# Patient Record
Sex: Female | Born: 1950 | Race: White | Hispanic: No | Smoking: Never smoker
Health system: Southern US, Community
[De-identification: ages and names within clinical notes are randomized; demographics above are authoritative.]

## PROBLEM LIST (undated history)

## (undated) DIAGNOSIS — K146 Glossodynia: Secondary | ICD-10-CM

## (undated) DIAGNOSIS — R109 Unspecified abdominal pain: Secondary | ICD-10-CM

## (undated) DIAGNOSIS — F329 Major depressive disorder, single episode, unspecified: Secondary | ICD-10-CM

## (undated) DIAGNOSIS — I10 Essential (primary) hypertension: Secondary | ICD-10-CM

## (undated) DIAGNOSIS — K921 Melena: Secondary | ICD-10-CM

## (undated) DIAGNOSIS — B370 Candidal stomatitis: Secondary | ICD-10-CM

## (undated) DIAGNOSIS — M797 Fibromyalgia: Secondary | ICD-10-CM

## (undated) DIAGNOSIS — K219 Gastro-esophageal reflux disease without esophagitis: Secondary | ICD-10-CM

## (undated) DIAGNOSIS — M810 Age-related osteoporosis without current pathological fracture: Secondary | ICD-10-CM

## (undated) DIAGNOSIS — F419 Anxiety disorder, unspecified: Secondary | ICD-10-CM

## (undated) HISTORY — DX: Anxiety disorder, unspecified: F41.9

## (undated) HISTORY — DX: Gastro-esophageal reflux disease without esophagitis: K21.9

## (undated) HISTORY — DX: Candidal stomatitis: B37.0

## (undated) HISTORY — DX: Unspecified abdominal pain: R10.9

## (undated) HISTORY — DX: Major depressive disorder, single episode, unspecified: F32.9

## (undated) HISTORY — DX: Age-related osteoporosis without current pathological fracture: M81.0

## (undated) HISTORY — DX: Melena: K92.1

## (undated) HISTORY — PX: ABDOMINAL HYSTERECTOMY: SHX81

## (undated) HISTORY — PX: BREAST LUMPECTOMY: SHX2

## (undated) HISTORY — DX: Glossodynia: K14.6

## (undated) HISTORY — PX: FOOT SURGERY: SHX648

## (undated) HISTORY — DX: Fibromyalgia: M79.7

## (undated) HISTORY — PX: TONSILLECTOMY: SUR1361

## (undated) HISTORY — DX: Essential (primary) hypertension: I10

---

## 1998-02-16 ENCOUNTER — Encounter: Admission: RE | Admit: 1998-02-16 | Discharge: 1998-02-16 | Payer: Self-pay | Admitting: Neurology

## 1998-03-08 ENCOUNTER — Encounter: Admission: RE | Admit: 1998-03-08 | Discharge: 1998-03-23 | Payer: Self-pay

## 1998-10-19 ENCOUNTER — Other Ambulatory Visit: Admission: RE | Admit: 1998-10-19 | Discharge: 1998-10-19 | Payer: Self-pay | Admitting: Obstetrics and Gynecology

## 1999-09-21 ENCOUNTER — Encounter: Payer: Self-pay | Admitting: Obstetrics and Gynecology

## 1999-09-21 ENCOUNTER — Encounter: Admission: RE | Admit: 1999-09-21 | Discharge: 1999-09-21 | Payer: Self-pay | Admitting: Obstetrics and Gynecology

## 1999-09-26 ENCOUNTER — Encounter: Payer: Self-pay | Admitting: Obstetrics and Gynecology

## 1999-09-26 ENCOUNTER — Encounter: Admission: RE | Admit: 1999-09-26 | Discharge: 1999-09-26 | Payer: Self-pay | Admitting: Obstetrics and Gynecology

## 1999-10-20 ENCOUNTER — Other Ambulatory Visit: Admission: RE | Admit: 1999-10-20 | Discharge: 1999-10-20 | Payer: Self-pay | Admitting: Obstetrics and Gynecology

## 2000-09-28 ENCOUNTER — Encounter: Payer: Self-pay | Admitting: Obstetrics and Gynecology

## 2000-09-28 ENCOUNTER — Encounter: Admission: RE | Admit: 2000-09-28 | Discharge: 2000-09-28 | Payer: Self-pay | Admitting: Obstetrics and Gynecology

## 2000-10-29 ENCOUNTER — Other Ambulatory Visit: Admission: RE | Admit: 2000-10-29 | Discharge: 2000-10-29 | Payer: Self-pay | Admitting: Obstetrics and Gynecology

## 2001-01-27 ENCOUNTER — Ambulatory Visit (HOSPITAL_COMMUNITY): Admission: RE | Admit: 2001-01-27 | Discharge: 2001-01-27 | Payer: Self-pay | Admitting: Family Medicine

## 2001-01-27 ENCOUNTER — Encounter: Payer: Self-pay | Admitting: Family Medicine

## 2001-06-24 ENCOUNTER — Ambulatory Visit (HOSPITAL_COMMUNITY): Admission: RE | Admit: 2001-06-24 | Discharge: 2001-06-24 | Payer: Self-pay | Admitting: Internal Medicine

## 2001-10-01 ENCOUNTER — Encounter: Payer: Self-pay | Admitting: Obstetrics and Gynecology

## 2001-10-01 ENCOUNTER — Encounter: Admission: RE | Admit: 2001-10-01 | Discharge: 2001-10-01 | Payer: Self-pay | Admitting: Obstetrics and Gynecology

## 2001-10-30 ENCOUNTER — Other Ambulatory Visit: Admission: RE | Admit: 2001-10-30 | Discharge: 2001-10-30 | Payer: Self-pay | Admitting: Obstetrics and Gynecology

## 2002-10-06 ENCOUNTER — Encounter: Admission: RE | Admit: 2002-10-06 | Discharge: 2002-10-06 | Payer: Self-pay | Admitting: Obstetrics and Gynecology

## 2002-10-06 ENCOUNTER — Encounter: Payer: Self-pay | Admitting: Obstetrics and Gynecology

## 2002-10-15 ENCOUNTER — Encounter: Admission: RE | Admit: 2002-10-15 | Discharge: 2002-10-15 | Payer: Self-pay | Admitting: Obstetrics and Gynecology

## 2002-10-15 ENCOUNTER — Encounter (INDEPENDENT_AMBULATORY_CARE_PROVIDER_SITE_OTHER): Payer: Self-pay | Admitting: Specialist

## 2002-10-15 ENCOUNTER — Encounter: Payer: Self-pay | Admitting: Obstetrics and Gynecology

## 2002-10-29 ENCOUNTER — Encounter: Payer: Self-pay | Admitting: Surgery

## 2002-10-29 ENCOUNTER — Encounter (HOSPITAL_COMMUNITY): Admission: RE | Admit: 2002-10-29 | Discharge: 2003-01-27 | Payer: Self-pay | Admitting: Surgery

## 2002-10-30 ENCOUNTER — Encounter: Payer: Self-pay | Admitting: Surgery

## 2002-11-13 ENCOUNTER — Encounter: Admission: RE | Admit: 2002-11-13 | Discharge: 2002-11-13 | Payer: Self-pay | Admitting: Surgery

## 2002-11-13 ENCOUNTER — Ambulatory Visit (HOSPITAL_BASED_OUTPATIENT_CLINIC_OR_DEPARTMENT_OTHER): Admission: RE | Admit: 2002-11-13 | Discharge: 2002-11-13 | Payer: Self-pay | Admitting: Surgery

## 2002-11-13 ENCOUNTER — Ambulatory Visit (HOSPITAL_COMMUNITY): Admission: RE | Admit: 2002-11-13 | Discharge: 2002-11-13 | Payer: Self-pay | Admitting: Surgery

## 2002-11-13 ENCOUNTER — Encounter (INDEPENDENT_AMBULATORY_CARE_PROVIDER_SITE_OTHER): Payer: Self-pay | Admitting: *Deleted

## 2002-11-14 HISTORY — PX: BREAST EXCISIONAL BIOPSY: SUR124

## 2003-10-23 ENCOUNTER — Encounter: Admission: RE | Admit: 2003-10-23 | Discharge: 2003-10-23 | Payer: Self-pay | Admitting: Obstetrics and Gynecology

## 2004-04-20 ENCOUNTER — Ambulatory Visit (HOSPITAL_COMMUNITY): Admission: RE | Admit: 2004-04-20 | Discharge: 2004-04-20 | Payer: Self-pay | Admitting: Family Medicine

## 2004-11-15 ENCOUNTER — Encounter: Admission: RE | Admit: 2004-11-15 | Discharge: 2004-11-15 | Payer: Self-pay | Admitting: Obstetrics and Gynecology

## 2004-11-24 ENCOUNTER — Ambulatory Visit (HOSPITAL_COMMUNITY): Admission: RE | Admit: 2004-11-24 | Discharge: 2004-11-24 | Payer: Self-pay | Admitting: Family Medicine

## 2005-11-16 ENCOUNTER — Encounter: Admission: RE | Admit: 2005-11-16 | Discharge: 2005-11-16 | Payer: Self-pay | Admitting: Obstetrics and Gynecology

## 2006-04-04 ENCOUNTER — Encounter: Admission: RE | Admit: 2006-04-04 | Discharge: 2006-04-04 | Payer: Self-pay | Admitting: Family Medicine

## 2006-05-18 ENCOUNTER — Encounter: Admission: RE | Admit: 2006-05-18 | Discharge: 2006-05-18 | Payer: Self-pay | Admitting: Neurosurgery

## 2006-05-27 ENCOUNTER — Encounter: Admission: RE | Admit: 2006-05-27 | Discharge: 2006-05-27 | Payer: Self-pay | Admitting: Neurosurgery

## 2006-12-12 ENCOUNTER — Encounter: Admission: RE | Admit: 2006-12-12 | Discharge: 2006-12-12 | Payer: Self-pay | Admitting: Obstetrics and Gynecology

## 2006-12-14 ENCOUNTER — Ambulatory Visit (HOSPITAL_COMMUNITY): Admission: RE | Admit: 2006-12-14 | Discharge: 2006-12-14 | Payer: Self-pay | Admitting: Obstetrics and Gynecology

## 2007-12-13 ENCOUNTER — Encounter: Admission: RE | Admit: 2007-12-13 | Discharge: 2007-12-13 | Payer: Self-pay | Admitting: Obstetrics and Gynecology

## 2009-01-04 ENCOUNTER — Encounter: Admission: RE | Admit: 2009-01-04 | Discharge: 2009-01-04 | Payer: Self-pay | Admitting: Obstetrics and Gynecology

## 2009-12-23 ENCOUNTER — Ambulatory Visit (HOSPITAL_COMMUNITY)
Admission: RE | Admit: 2009-12-23 | Discharge: 2009-12-23 | Payer: Self-pay | Source: Home / Self Care | Attending: Obstetrics and Gynecology | Admitting: Obstetrics and Gynecology

## 2010-01-12 ENCOUNTER — Encounter
Admission: RE | Admit: 2010-01-12 | Discharge: 2010-01-12 | Payer: Self-pay | Source: Home / Self Care | Attending: Obstetrics and Gynecology | Admitting: Obstetrics and Gynecology

## 2010-05-27 NOTE — Op Note (Signed)
[facility]  Patient:    Dawn Reyes, Dawn Reyes Visit Number: [id number] MRN: [id number]          Service Type: END Location: DAY Attending Physician:  Jonathon Bellows Dictated by:   Roetta Sessions, M.D. Proc. Date: 06/24/01 Admit Date:  06/24/2001   CC:         Patrica Duel, M.D.   Operative Report  PROCEDURE:  Screening colonoscopy.  ENDOSCOPIST:  Roetta Sessions, M.D.  INDICATIONS:  The patient is a 60 year old lady referred out of the courtesy of Dr. Patrica Duel for colorectal cancer screening.  She is devoid of any lower GI tract symptoms.  There is no history of colorectal cancer in her family.  She has never had her lower GI tract imaged.  Colonoscopy is now being done as a standard screening maneuver.  This approach has been discussed with the patient including the potential risks, benefits and alternatives have been discussed and all questions answered.  She is agreeable.  Please see my handwritten H&P.  The patient is low risk for conscious sedation with Versed and Demerol.  DESCRIPTION OF PROCEDURE:  The O2 saturation, blood pressure, pulse and respirations were monitored throughout the entire procedure.  Conscious sedation with Versed 4 mg IV, Demerol 100 mg IV in divided doses.  Instrument was an Olympus video chip pediatric colonoscope.  Findings:  Digital rectal examination revealed no abnormalities.  Endoscopic findings:  The prep was good.  Examination of the rectal mucosa including a retroflexed view of the anal verge revealed no abnormalities.  Colonic mucosa:  The colonic mucosa was surveyed from the rectosigmoid junction to the left transverse and right colon.  The colonic mucosa all the way to the cecum appeared normal.  The ileocecal valve, cecum and appendiceal orifice were photographed for the record, and from his level, the scope was slowly and cautiously withdrawn.  All previously mentioned mucosal surfaces were  again seen and no abnormalities were observed.  The patient tolerated the procedure well and was reactive in endoscopy.  IMPRESSION: 1. Normal rectum. 2. Normal colon.  RECOMMENDATIONS: 1. Repeat colonoscopy in 10 years. 2. Follow up with Dr. Nobie Putnam as scheduled. Dictated by:   Roetta Sessions, M.D. Attending Physician:  Jonathon Bellows DD:  06/24/01 TD:  06/25/01 Job: [id number]

## 2010-05-27 NOTE — Op Note (Signed)
   NAME:  Dawn Reyes, Dawn Reyes                           ACCOUNT NO.:  [id number]   MEDICAL RECORD NO.:  [id number]                   PATIENT TYPE:  AMB   LOCATION:  DSC                                  FACILITY:  [facility]   PHYSICIAN:  Currie Paris, M.D.           DATE OF BIRTH:  12-10-1950   DATE OF PROCEDURE:  11/13/2002  DATE OF DISCHARGE:                                 OPERATIVE REPORT   [facility]S [phone]   PREOPERATIVE DIAGNOSIS:  Right breast mass, possible radial scar.   POSTOPERATIVE DIAGNOSIS:  Right breast mass, possible radial scar.   PROCEDURE:  Needle guided excision of right breast mass.   SURGEON:  Currie Paris, M.D.   ANESTHESIA:  MAC.   INDICATIONS FOR PROCEDURE:  The patient is a 60 year old who has an  abnormality in her right breast and a core biopsy showed what appeared to be  some areas of radial scar.  There was no clear cut evidence that this was  malignant.  MRI's showed some persistent distortion.  It was elected to  proceed to excisional biopsy.   DESCRIPTION OF PROCEDURE:  The patient was seen and the area having had a  guide wire placed.  The guide wire entered laterally and tracked medially.  The breast was prepped and draped.  She was given IV sedation.  1% Xylocaine  was used for local. A direct incision over the area was made.  The guide  wire was manipulated into the incision which was a radially oriented  incision.  I tried to get a wide area of breast tissue around this area,  such that we were completely around the palpable area of abnormality which  was about 2 cm.  Some of this appeared to be some fibrocystic change and  some of it appeared to be some old hemorrhage ecchymosis from her prior core  biopsy.  The area least grossly appeared to be negative tissue at the  periphery.   The incision was closed with some 3-0 Vicryl followed by 4-0 Monocryl  subcuticular and Dermabond on the skin. The patient tolerated the procedure  well. There  were no complications and all counts were correct.                                               Currie Paris, M.D.    CJS/MEDQ  D:  11/13/2002  T:  11/13/2002  Job:  [id number]   cc:   Patrica Duel, M.D.  [address], [address]  [place]  [place] [address]  Fax: [phone]

## 2010-12-08 ENCOUNTER — Other Ambulatory Visit: Payer: Self-pay | Admitting: Endodontics

## 2011-04-12 ENCOUNTER — Other Ambulatory Visit: Payer: Self-pay | Admitting: Obstetrics and Gynecology

## 2011-04-12 DIAGNOSIS — Z1231 Encounter for screening mammogram for malignant neoplasm of breast: Secondary | ICD-10-CM

## 2011-04-25 ENCOUNTER — Ambulatory Visit
Admission: RE | Admit: 2011-04-25 | Discharge: 2011-04-25 | Disposition: A | Discharge status: Home / Self Care | Payer: BC Managed Care – PPO | Source: Ambulatory Visit | Attending: Obstetrics and Gynecology | Admitting: Obstetrics and Gynecology

## 2011-04-25 DIAGNOSIS — Z1231 Encounter for screening mammogram for malignant neoplasm of breast: Secondary | ICD-10-CM

## 2011-10-05 NOTE — Progress Notes (Signed)
I spoke with a representative of Dr. Huel Cote and was told they have obtained the Reclast dose through the manufacturer at no charge and will deliver it to the [facility] pharmacy sometime before 10/23/11. Do not charge the patient for the drug.   Charolotte Eke, PharmD, pager [phone]. 10/05/2011,6:28 PM.

## 2011-10-23 ENCOUNTER — Inpatient Hospital Stay (HOSPITAL_COMMUNITY): Admission: RE | Admit: 2011-10-23 | Discharge: 2011-10-23 | Payer: BC Managed Care – PPO | Source: Ambulatory Visit

## 2011-12-14 ENCOUNTER — Encounter: Payer: Self-pay | Admitting: Internal Medicine

## 2011-12-20 ENCOUNTER — Other Ambulatory Visit (INDEPENDENT_AMBULATORY_CARE_PROVIDER_SITE_OTHER): Payer: BC Managed Care – PPO

## 2011-12-20 ENCOUNTER — Ambulatory Visit (INDEPENDENT_AMBULATORY_CARE_PROVIDER_SITE_OTHER): Payer: BC Managed Care – PPO | Admitting: Internal Medicine

## 2011-12-20 ENCOUNTER — Encounter: Payer: Self-pay | Admitting: Internal Medicine

## 2011-12-20 VITALS — BP 96/72 | HR 80 | Ht 61.0 in | Wt 122.2 lb

## 2011-12-20 DIAGNOSIS — K5900 Constipation, unspecified: Secondary | ICD-10-CM

## 2011-12-20 DIAGNOSIS — R1013 Epigastric pain: Secondary | ICD-10-CM

## 2011-12-20 DIAGNOSIS — Z1211 Encounter for screening for malignant neoplasm of colon: Secondary | ICD-10-CM

## 2011-12-20 DIAGNOSIS — K219 Gastro-esophageal reflux disease without esophagitis: Secondary | ICD-10-CM

## 2011-12-20 LAB — CBC WITH DIFFERENTIAL/PLATELET
Eosinophils Relative: 3.3 % (ref 0.0–5.0)
HCT: 38.2 % (ref 36.0–46.0)
Lymphs Abs: 1.5 10*3/uL (ref 0.7–4.0)
Monocytes Relative: 7.8 % (ref 3.0–12.0)
Platelets: 294 10*3/uL (ref 150.0–400.0)
RBC: 4.03 Mil/uL (ref 3.87–5.11)
WBC: 4.3 10*3/uL — ABNORMAL LOW (ref 4.5–10.5)

## 2011-12-20 LAB — BASIC METABOLIC PANEL
BUN: 13 mg/dL (ref 6–23)
Calcium: 9.1 mg/dL (ref 8.4–10.5)
Chloride: 98 mEq/L (ref 96–112)
Creatinine, Ser: 0.6 mg/dL (ref 0.4–1.2)
GFR: 111.88 mL/min (ref >60.00)

## 2011-12-20 LAB — HEPATIC FUNCTION PANEL
ALT: 20 U/L (ref 0–35)
Total Bilirubin: 0.6 mg/dL (ref 0.3–1.2)

## 2011-12-20 LAB — TSH: TSH: 2.26 u[IU]/mL (ref 0.35–5.50)

## 2011-12-20 LAB — LIPASE: Lipase: 32 U/L (ref 11.0–59.0)

## 2011-12-20 MED ORDER — MOVIPREP 100 G PO SOLR: 1.0000 | Freq: Once | ORAL | Status: DC

## 2011-12-20 NOTE — Patient Instructions (Addendum)
Your physician has requested that you go to the basement for lab work before leaving today  You have been scheduled for an abdominal ultrasound at [facility] (1st floor of hospital) on 12-20-12 at 9:15. Please arrive 15 minutes prior to your appointment for registration. Make certain not to have anything to eat or drink 6 hours prior to your appointment. Should you need to reschedule your appointment, please contact radiology at [phone]. This test typically takes about 30 minutes to perform.  You have been scheduled for an endoscopy and colonoscopy with propofol. Please follow the written instructions given to you at your visit today. Please pick up your prep at the pharmacy within the next 1-3 days. If you use inhalers (even only as needed) or a CPAP machine, please bring them with you on the day of your procedure.

## 2011-12-20 NOTE — Progress Notes (Signed)
HISTORY OF PRESENT ILLNESS:  Dawn Reyes is a 61 y.o. female with fibromyalgia, anxiety, and GERD. She is sent today in consultation regarding several symptoms. Her chief complaint is that of a burning tongue sensation which she has had since January 2013 after undergoing extensive dental work. She has apparently seen multiple physicians regarding this complaint without cause identified. She does tell me that she was recently placed on clonazepam 3 days ago, which has resolved the burning. Her next complaint is that of epigastric pain which she has had for several months. She describes it as a dull sick feeling. She states that the discomfort is present at all times. It does not prevent her from engaging in activities or sleeping. She cannot identify any relieving or exacerbating factors. The discomfort is not affected by meals, activity, or proton pump inhibitor therapy. She had been on chronic Prevacid for presumed GERD. Recently changed to Nexium. The discomfort is focal and nonradiating. No significant sustained weight loss. She distinguish is this from her fibromyalgia. She also reports substernal burning despite PPI. She has had nausea. Vomiting on one occasion. Other complaints include belching and bloating. She reports chronic constipation but no bleeding. No prior upper endoscopy. Possible colonoscopy greater than 10 years ago. No family history of GI malignancy. Review of available outside records shows a negative ultrasound in 2008.  REVIEW OF SYSTEMS:  All non-GI ROS negative except for anxiety, depression, arthritis, back pain, visual change, fatigue, headaches,  insomnia  Past Medical History  Diagnosis Date  . Fibromyalgia   . Candidiasis of mouth   . Anxiety   . Benign hypertension   . GERD (gastroesophageal reflux disease)     Past Surgical History  Procedure Date  . Abdominal hysterectomy   . Breast lumpectomy   . Foot surgery     bilateral  . Tonsillectomy     Social  History ERIAN LARIVIERE  reports that she has never smoked. She has never used smokeless tobacco. She reports that she does not drink alcohol or use illicit drugs.  family history includes Hypertension in her father; Liver cancer in her father; and Lung cancer in her father and mother.  Allergies  Allergen Reactions  . Vicodin (Hydrocodone-Acetaminophen)        PHYSICAL EXAMINATION: Vital signs: BP 96/72  Pulse 80  Ht 5\' 1"  (1.549 m)  Wt 122 lb 3.2 oz (55.43 kg)  BMI 23.09 kg/m2  Constitutional: generally well-appearing, no acute distress Psychiatric: alert and oriented x3, cooperative Eyes: extraocular movements intact, anicteric, conjunctiva pink Mouth: oral pharynx moist, no lesions Neck: supple no lymphadenopathy Cardiovascular: heart regular rate and rhythm, no murmur Lungs: clear to auscultation bilaterally Abdomen: soft, nontender, nondistended, no obvious ascites, no peritoneal signs, normal bowel sounds, no organomegaly Rectal:deferred until colonoscopy Extremities: no lower extremity edema bilaterally Skin: no lesions on visible extremities Neuro: No focal deficits. No asterixis.     ASSESSMENT:  #1. Dyspepsia (epigastric discomfort) unresponsive to PPI. No alarm features. Suspect functional #2. Colon cancer screening. Baseline risk #3. Functional constipation   PLAN:  #1. Abdominal ultrasound to evaluate epigastric pain #2. Screening laboratories as ordered to evaluate epigastric pain #3. Diagnostic upper endoscopy.The nature of the procedure, as well as the risks, benefits, and alternatives were carefully and thoroughly reviewed with the patient. Ample time for discussion and questions allowed. The patient understood, was satisfied, and agreed to proceed.  #4. Screening colonoscopy.The nature of the procedure, as well as the risks, benefits, and alternatives were  carefully and thoroughly reviewed with the patient. Ample time for discussion and questions allowed.  The patient understood, was satisfied, and agreed to proceed.  #5. Movi prep prescribed. The patient instructed on its use

## 2011-12-21 ENCOUNTER — Ambulatory Visit (HOSPITAL_COMMUNITY)
Admission: RE | Admit: 2011-12-21 | Discharge: 2011-12-21 | Disposition: A | Discharge status: Home / Self Care | Payer: BC Managed Care – PPO | Source: Ambulatory Visit | Attending: Internal Medicine | Admitting: Internal Medicine

## 2011-12-21 DIAGNOSIS — Z1211 Encounter for screening for malignant neoplasm of colon: Secondary | ICD-10-CM

## 2011-12-21 DIAGNOSIS — R1013 Epigastric pain: Secondary | ICD-10-CM | POA: Insufficient documentation

## 2011-12-21 DIAGNOSIS — K5900 Constipation, unspecified: Secondary | ICD-10-CM

## 2011-12-21 DIAGNOSIS — K219 Gastro-esophageal reflux disease without esophagitis: Secondary | ICD-10-CM

## 2012-01-25 ENCOUNTER — Ambulatory Visit (AMBULATORY_SURGERY_CENTER): Payer: BC Managed Care – PPO | Admitting: Internal Medicine

## 2012-01-25 ENCOUNTER — Encounter: Payer: Self-pay | Admitting: Internal Medicine

## 2012-01-25 VITALS — BP 128/79 | HR 68 | Temp 98.7°F | Resp 19 | Ht 61.0 in | Wt 122.0 lb

## 2012-01-25 DIAGNOSIS — D126 Benign neoplasm of colon, unspecified: Secondary | ICD-10-CM

## 2012-01-25 DIAGNOSIS — K219 Gastro-esophageal reflux disease without esophagitis: Secondary | ICD-10-CM

## 2012-01-25 DIAGNOSIS — R1013 Epigastric pain: Secondary | ICD-10-CM

## 2012-01-25 DIAGNOSIS — Z1211 Encounter for screening for malignant neoplasm of colon: Secondary | ICD-10-CM

## 2012-01-25 DIAGNOSIS — K5900 Constipation, unspecified: Secondary | ICD-10-CM

## 2012-01-25 MED ORDER — SODIUM CHLORIDE 0.9 % IV SOLN: 500.0000 mL | INTRAVENOUS | Status: DC

## 2012-01-25 NOTE — Progress Notes (Signed)
Patient did not experience any of the following events: a burn prior to discharge; a fall within the facility; wrong site/side/patient/procedure/implant event; or a hospital transfer or hospital admission upon discharge from the facility. (G8907) Patient did not have preoperative order for IV antibiotic SSI prophylaxis. (G8918)  

## 2012-01-25 NOTE — Progress Notes (Signed)
Called to room to assist during endoscopic procedure.  Patient ID and intended procedure confirmed with present staff. Received instructions for my participation in the procedure from the performing physician.  

## 2012-01-25 NOTE — Op Note (Signed)
[facility] [address] [place], [address]   ENDOSCOPY PROCEDURE REPORT  PATIENT: Dawn, Reyes  MR#: [id number] BIRTHDATE: 1951/01/07 , 61  yrs. old GENDER: Female ENDOSCOPIST: Roxy Cedar, MD REFERRED BY:  Marva Panda, M.D. PROCEDURE DATE:  01/25/2012 PROCEDURE:  EGD, diagnostic ASA CLASS:     Class II INDICATIONS:  Epigastric pain. MEDICATIONS: MAC sedation, administered by CRNA and propofol (Diprivan) 50mg  IV TOPICAL ANESTHETIC: none  DESCRIPTION OF PROCEDURE: After the risks benefits and alternatives of the procedure were thoroughly explained, informed consent was obtained.  The LB GIF-H180 [id number] endoscope was introduced through the mouth and advanced to the second portion of the duodenum. Without limitations.  The instrument was slowly withdrawn as the mucosa was fully examined.      The upper, middle and distal third of the esophagus were carefully inspected and no abnormalities were noted.  The z-line was well seen at the GEJ.  The endoscope was pushed into the fundus which was normal including a retroflexed view.  The antrum, gastric body, first and second part of the duodenum were unremarkable. Retroflexed views revealed no abnormalities.     The scope was then withdrawn from the patient and the procedure completed.  COMPLICATIONS: There were no complications. ENDOSCOPIC IMPRESSION: 1. Normal EGD  RECOMMENDATIONS: 1.  Anti-reflux regimen to be followed 2.  OP follow-up is advised on a PRN basis. Return to the care of your primary provider  REPEAT EXAM:  eSigned:  Roxy Cedar, MD 01/25/2012 3:47 PM   XB:[id number], [name] MD and The Patient

## 2012-01-25 NOTE — Patient Instructions (Addendum)

## 2012-01-25 NOTE — Op Note (Signed)
[facility] [address] [place], [address]   COLONOSCOPY PROCEDURE REPORT  PATIENT: Dawn, Reyes  MR#: [id number] BIRTHDATE: 1950/04/04 , 61  yrs. old GENDER: Female ENDOSCOPIST: Roxy Cedar, MD REFERRED AY:[id number] Jani Gravel, M.D. PROCEDURE DATE:  01/25/2012 PROCEDURE:   Colonoscopy with snare polypectomy    x 1 ASA CLASS:   Class II INDICATIONS:average risk screening. MEDICATIONS: MAC sedation, administered by CRNA and propofol (Diprivan) 300mg  IV  DESCRIPTION OF PROCEDURE:   After the risks benefits and alternatives of the procedure were thoroughly explained, informed consent was obtained.  A digital rectal exam revealed no abnormalities of the rectum.   The LB CF-H180AL [id number]  endoscope was introduced through the anus and advanced to the cecum, which was identified by both the appendix and ileocecal valve. No adverse events experienced.   The quality of the prep was excellent, using MoviPrep  The instrument was then slowly withdrawn as the colon was fully examined.      COLON FINDINGS: A diminutive polyp was found at the cecum.  A polypectomy was performed with a cold snare.  The resection was complete and the polyp tissue was completely retrieved.   Mild diverticulosis was noted in the sigmoid colon.   The colon mucosa was otherwise normal.  Retroflexed views revealed no abnormalities. The time to cecum=6 minutes 02 seconds.  Withdrawal time=11 minutes 02 seconds.  The scope was withdrawn and the procedure completed. COMPLICATIONS: There were no complications.  ENDOSCOPIC IMPRESSION: 1.   Diminutive polyp was found at the cecum; polypectomy was performed with a cold snare 2.   Mild diverticulosis was noted in the sigmoid colon 3.   The colon mucosa was otherwise normal  RECOMMENDATIONS: 1.  Repeat colonoscopy in 5 years if polyp adenomatous; otherwise 10 years 2.  Upper endoscopy today (see report)   eSigned:  Roxy Cedar, MD  01/25/2012 3:43 PM   cc: Marva Panda MD and The Patient   PATIENT NAME:  Dawn, Reyes MR#: [id number]

## 2012-01-25 NOTE — Progress Notes (Signed)
VVS,A&O X3,Pleased with MAC. Report to April RN, DRM

## 2012-01-26 ENCOUNTER — Telehealth: Payer: Self-pay | Admitting: *Deleted

## 2012-01-26 NOTE — Telephone Encounter (Signed)
On f/u call did not leave message because this was a business ,[employer]. Left message on home # with caller id as Amada Jupiter.

## 2012-01-26 NOTE — Telephone Encounter (Signed)
FU CALL

## 2012-01-31 ENCOUNTER — Encounter: Payer: Self-pay | Admitting: Internal Medicine

## 2012-02-13 ENCOUNTER — Other Ambulatory Visit (INDEPENDENT_AMBULATORY_CARE_PROVIDER_SITE_OTHER): Payer: BC Managed Care – PPO

## 2012-02-13 ENCOUNTER — Encounter: Payer: Self-pay | Admitting: Nurse Practitioner

## 2012-02-13 ENCOUNTER — Telehealth: Payer: Self-pay

## 2012-02-13 ENCOUNTER — Ambulatory Visit (INDEPENDENT_AMBULATORY_CARE_PROVIDER_SITE_OTHER): Payer: BC Managed Care – PPO | Admitting: Nurse Practitioner

## 2012-02-13 VITALS — BP 110/74 | HR 56 | Ht 62.0 in | Wt 124.3 lb

## 2012-02-13 DIAGNOSIS — K921 Melena: Secondary | ICD-10-CM

## 2012-02-13 DIAGNOSIS — R109 Unspecified abdominal pain: Secondary | ICD-10-CM

## 2012-02-13 DIAGNOSIS — K625 Hemorrhage of anus and rectum: Secondary | ICD-10-CM

## 2012-02-13 HISTORY — DX: Melena: K92.1

## 2012-02-13 HISTORY — DX: Unspecified abdominal pain: R10.9

## 2012-02-13 LAB — BASIC METABOLIC PANEL
CO2: 31 mEq/L (ref 19–32)
Chloride: 94 mEq/L — ABNORMAL LOW (ref 96–112)
Glucose, Bld: 88 mg/dL (ref 70–99)
Potassium: 3.9 mEq/L (ref 3.5–5.1)
Sodium: 134 mEq/L — ABNORMAL LOW (ref 135–145)

## 2012-02-13 LAB — CBC WITH DIFFERENTIAL/PLATELET
Eosinophils Relative: 1.9 % (ref 0.0–5.0)
HCT: 37.6 % (ref 36.0–46.0)
Lymphocytes Relative: 20.3 % (ref 12.0–46.0)
Lymphs Abs: 1.7 10*3/uL (ref 0.7–4.0)
Monocytes Relative: 6.4 % (ref 3.0–12.0)
Neutrophils Relative %: 71.1 % (ref 43.0–77.0)
Platelets: 268 10*3/uL (ref 150.0–400.0)
WBC: 8.3 10*3/uL (ref 4.5–10.5)

## 2012-02-13 MED ORDER — ESOMEPRAZOLE MAGNESIUM 20 MG PO CPDR: 20.0000 mg | DELAYED_RELEASE_CAPSULE | Freq: Every day | ORAL | Status: DC

## 2012-02-13 NOTE — Progress Notes (Signed)
02/13/2012 Dawn Reyes [id number] 1950/05/28   History of Present Illness:  Patient is a 62 year old female known to Dr. Marina Goodell for a history of GERD.She was seen December 2013 for evaluation of epigastric pain. Patient subsequently underwent EGD which was normal. She also had a nondiagnostic abdominal ultrasound. At time of EGD patient had a screening colonoscopy pertinent for only mild diverticulosis and a diminutive cecal polyp(tubular adenoma).   Patient here for evaluation of rectal bleeding. She gives a history of chronic constipation. Two nights ago patient took a couple of stool softeners. She woke up in the middle of the night with left mid abdominal pain and urge to defecate. She had a couple of bowel movements over the following few hours but didn't turn on the light so doesn't know if there was associated bleeding. Around 5:30 AM patient had another bowel movement at which time she noticed a fair amount of blood in the stool. Yesterday and this morning patient has continued to pass blood but mainly with flatus, no further  BMs She continues to have left mid abdominal pain. At no point has she had any diarrhea. No recent episodes of dehydration. She hasn't taken any decongestants or other medications known to cause ischemic colitis. She has minimal lightheadedness but is slightly nauseated  Patient gives a history of GERD, she has been on OTC Prevacid but Nexium works better.    Current Medications, Allergies, Past Medical History, Past Surgical History, Family History and Social History were reviewed in [employer] record.   Physical Exam: General: Well developed , white female in no acute distress Head: Normocephalic and atraumatic Eyes:  sclerae anicteric, conjunctiva pink  Ears: Normal auditory acuity Lungs: Clear throughout to auscultation Heart: Regular rate and rhythm Abdomen: Soft, non tender and non distended. No masses, no hepatomegaly. Normal bowel  sounds Rectal: no external lesions. Small amount of bright red blood on vault. Musculoskeletal: Symmetrical with no gross deformities  Extremities: No edema  Neurological: Alert oriented x 4, grossly nonfocal Psychological:  Alert and cooperative. Normal mood and affect  Assessment and Recommendations:  1. Left mid abdominal pain and hematochezia. Suspect ischemic colitis. Patient looks and feels okay, we can mostly likely manage this outpatient.  Will check CBC, BMET. She needs to increase oral fluid intake. Patient will followup with me in 7-10 days. She knows to call us, or go to the emergency department in the interim for dizziness, shortness of breath, or if bleeding doesn't resolve within the next 24-48 hours.   2. GERD. Will refill her daily Nexium

## 2012-02-13 NOTE — Patient Instructions (Addendum)
Please go to the basement today for lab work  Please stay well hydrated.  If you have a worsening of your abdominal pain, increase in bleeding, or new symptoms please call [phone] or go to the ER for evaluation if after hours.    You have been scheduled for a follow up office visit 02/23/12 0830

## 2012-02-13 NOTE — Progress Notes (Signed)
Agree with initial assessment and plans. Case discussed with the nurse practitioner today

## 2012-02-13 NOTE — Telephone Encounter (Signed)
Pt walked in the office today and states that she has been having rectal bleeding and abdominal pain for several days. Pt just had colon with Dr. Marina Goodell 01/25/12. Pt was told she has diverticulosis and she wonders if she might need an antibiotic. Discussed with pt the difference between diverticulosis and diverticulitis, pt states her pain is in her LLQ. Pt scheduled to see Willette Cluster NP today at 9:30am. Pt aware and in the process of being checked in.

## 2012-02-22 ENCOUNTER — Ambulatory Visit: Payer: BC Managed Care – PPO | Admitting: Nurse Practitioner

## 2012-02-23 ENCOUNTER — Ambulatory Visit: Payer: BC Managed Care – PPO | Admitting: Nurse Practitioner

## 2012-02-24 ENCOUNTER — Other Ambulatory Visit: Payer: Self-pay

## 2012-07-01 ENCOUNTER — Other Ambulatory Visit: Payer: Self-pay

## 2012-07-01 DIAGNOSIS — Z1231 Encounter for screening mammogram for malignant neoplasm of breast: Secondary | ICD-10-CM

## 2012-07-03 ENCOUNTER — Ambulatory Visit
Admission: RE | Admit: 2012-07-03 | Discharge: 2012-07-03 | Disposition: A | Discharge status: Home / Self Care | Payer: BC Managed Care – PPO | Source: Ambulatory Visit

## 2012-07-03 DIAGNOSIS — Z1231 Encounter for screening mammogram for malignant neoplasm of breast: Secondary | ICD-10-CM

## 2012-07-15 ENCOUNTER — Ambulatory Visit: Payer: BC Managed Care – PPO | Admitting: Family Medicine

## 2012-07-16 ENCOUNTER — Encounter: Payer: Self-pay | Admitting: Family Medicine

## 2012-07-16 ENCOUNTER — Ambulatory Visit (INDEPENDENT_AMBULATORY_CARE_PROVIDER_SITE_OTHER): Payer: BC Managed Care – PPO | Admitting: Family Medicine

## 2012-07-16 VITALS — BP 138/80 | Temp 98.5°F | Ht 62.0 in | Wt 136.0 lb

## 2012-07-16 DIAGNOSIS — K219 Gastro-esophageal reflux disease without esophagitis: Secondary | ICD-10-CM

## 2012-07-16 DIAGNOSIS — IMO0002 Reserved for concepts with insufficient information to code with codable children: Secondary | ICD-10-CM

## 2012-07-16 DIAGNOSIS — M810 Age-related osteoporosis without current pathological fracture: Secondary | ICD-10-CM

## 2012-07-16 DIAGNOSIS — M792 Neuralgia and neuritis, unspecified: Secondary | ICD-10-CM

## 2012-07-16 DIAGNOSIS — I10 Essential (primary) hypertension: Secondary | ICD-10-CM

## 2012-07-16 DIAGNOSIS — F32A Depression, unspecified: Secondary | ICD-10-CM

## 2012-07-16 DIAGNOSIS — F329 Major depressive disorder, single episode, unspecified: Secondary | ICD-10-CM

## 2012-07-16 DIAGNOSIS — Z7689 Persons encountering health services in other specified circumstances: Secondary | ICD-10-CM

## 2012-07-16 HISTORY — DX: Depression, unspecified: F32.A

## 2012-07-16 MED ORDER — ESCITALOPRAM OXALATE 10 MG PO TABS: 10.0000 mg | ORAL_TABLET | Freq: Every day | ORAL | Status: DC

## 2012-07-16 NOTE — Patient Instructions (Signed)
-  PLEASE SIGN UP FOR MYCHART TODAY   -We placed a referral for you as discussed to the neurologist for the tingling in your face and to Dr. Corky Sox for your osteoporosis. It usually takes about 1-2 weeks to process and schedule this referral. If you have not heard from [place] regarding this appointment in 2 weeks please contact our office.  -Check with your last doctor about your previous tetanus vaccine and call my nurse  -check on cost of shingles vaccine with your insurance and let us know if you want this at your physical exam  We recommend the following healthy lifestyle measures: - eat a healthy diet consisting of lots of vegetables, fruits, beans, nuts, seeds, healthy meats such as white chicken and fish and whole grains.  - avoid fried foods, fast food, processed foods, sodas, red meet and other fattening foods.  - get a least 150 minutes of aerobic exercise per week.   Follow up in: 4-6 months for physical and we will do then or as needed

## 2012-07-16 NOTE — Progress Notes (Signed)
Chief Complaint  Patient presents with  . Establish Care    HPI:  Dawn Reyes is here to establish care. Had a PA at work for a few years then was at [facility] - but has not had PCP in 3 years. Last PCP and physical: had gyn physical 4.2013 with pap and had mammogram last month that was good.Has osteoporosis and she was on fosamax for several years but cause a lot of heartburn and indigestion. Wants to consider reclast. Wants to see specialist about this and treatment options. Last DEXA 2011. Takes calcium (500mg ) and vitaimin D (2000 IU D3). No regular exercise. No hx of smoking. Sister had osteoporosis young.   Has the following chronic problems and concerns today:  Depression/Anxiety: -sees Dr. Lafayette Dragon in psychiatry -takes lexapro and xanax at night  -need refill on lexapro until sees psych -stable  GERD: -takes nexium for this -followed by Dr. Marina Goodell -had colonoscopy and EGD in 2014 -stable  HTN: -on HCTZ chronically -denies: CP, SOB, palpitations  Patient Active Problem List   Diagnosis Date Noted  . Depression and Anxiety - followed by Dr. Westley Chandler in psychiatry 07/16/2012  . Essential hypertension, benign 07/16/2012  . GERD (gastroesophageal reflux disease) 07/16/2012  . Neuralgia 07/16/2012   Nerve issue in face: -for about 4 years, unchanged -tingling sensation in L check when puts on make up and also burning of tongue  -has seen dentist and GI about this -tx with clonazepam in the past which did not help -wants to see neurology  Health Maintenance: -UTD for the most part - will see Dr. Lafe Garin for osteoporosis -is unsure of last tetanus -she will check with insurance  ROS: See pertinent positives and negatives per HPI.  Past Medical History  Diagnosis Date  . Candidiasis of mouth   . Anxiety   . Benign hypertension   . GERD (gastroesophageal reflux disease)   . Hematochezia 02/13/2012  . Left sided abdominal pain 02/13/2012  . Fibromyalgia     Family  History  Problem Relation Age of Onset  . Hypertension Father   . Lung cancer Father   . Liver cancer Father   . Lung cancer Mother   . Hypertension Sister   . Osteoporosis Sister   . Hypertension Brother   . Arthritis Brother     History   Social History  . Marital Status: Married    Spouse Name: N/A    Number of Children: 2  . 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 Smoking status: Never Smoker   . Smokeless tobacco: Never Used  . Alcohol Use: No  . Drug Use: No  . Sexually Active: None   Other Topics Concern  . None   Social History Narrative   Work or School: industry for [employer]      Home Situation: lives with husband      Spiritual Beliefs: Baptist      Lifestyle: no regular exercise; diet is ok                Current outpatient prescriptions:ALPRAZolam (XANAX) 1 MG tablet, Take 2 mg by mouth at bedtime as needed for sleep., Disp: , Rfl: ;  Cholecalciferol (VITAMIN D3) 2000 UNITS TABS, Take 1 tablet by mouth daily., Disp: , Rfl: ;  Coral Calcium 1000 (390 CA) MG TABS, Take 1 tablet by mouth daily., Disp: , Rfl: ;  escitalopram (LEXAPRO) 10 MG tablet,  Take 1 tablet (10 mg total) by mouth daily., Disp: 90 tablet, Rfl: 1 esomeprazole (NEXIUM) 20 MG capsule, Take 1 capsule (20 mg total) by mouth daily before breakfast., Disp: 30 capsule, Rfl: 11;  hydrochlorothiazide (MICROZIDE) 12.5 MG capsule, Take 12.5 mg by mouth daily., Disp: , Rfl: ;  Krill Oil 300 MG CAPS, Take 1 capsule by mouth daily., Disp: , Rfl: ;  Multiple Vitamin (MULTIVITAMIN) tablet, Take 1 tablet by mouth daily., Disp: , Rfl:   EXAM:  Filed Vitals:   07/16/12 1617  BP: 138/80  Temp: 98.5 F (36.9 C)    Body mass index is 24.87 kg/(m^2).  GENERAL: vitals reviewed and listed above, alert, oriented, appears well hydrated and in no acute distress  HEENT: atraumatic, PERRLA, EOMI, conjunttiva clear, no obvious abnormalities on inspection of  external nose and ears  NECK: no obvious masses on inspection  LUNGS: clear to auscultation bilaterally, no wheezes, rales or rhonchi, good air movement  CV: HRRR, no peripheral edema  MS: moves all extremities without noticeable abnormality  PSYCH: pleasant and cooperative, no obvious depression or anxiety  NEURO: increased sensitivity to light touch V2 distribution on L, otherwise CN II-XII grossly intact, finger to nose normal, normal movement of all extremities, normal tongue and buccal muscle strength and movements  ASSESSMENT AND PLAN:  Discussed the following assessment and plan:  Encounter to establish care  Osteoporosis - Plan: Ambulatory referral to Endocrinology -she wants to reclast and wants to see endocrine as did not tolerate prior oral treatments for osteoporosis -referral placed -advised Vit D, calcium and weight bearing exercise in meantime -reviewed last dexa results  Depression - Plan: escitalopram (LEXAPRO) 10 MG tablet -refilled lexapro to get to next appt with psych -advised will defer to psych for all further psych med refills  Neuralgia - Plan: Ambulatory referral to Neurology -? Trigeminal neuralgia, tingling/burning sensation in face and also in tongue for 4 years, tx with clonazepam in the past which did not help -she wants to see neurologist, referral placed -wean off clonazepam  GERD (gastroesophageal reflux disease) -cont PPI  HTN (hypertension) -cont HCTZ  We reviewed the PMH, PSH, FH, SH, Meds and Allergies. -We provided refills for any medications we will prescribe as needed. -We addressed current concerns per orders and patient instructions. -We have asked for records for pertinent exams, studies, vaccines and notes from previous providers. -We have advised patient to follow up per instructions below. ->45 minutes spent with this patient  -Patient advised to return or notify a doctor immediately if symptoms worsen or persist or new  concerns arise.  Patient Instructions   -PLEASE SIGN UP FOR MYCHART TODAY   -We placed a referral for you as discussed to the neurologist for the tingling in your face and to Dr. Corky Sox for your osteoporosis. It usually takes about 1-2 weeks to process and schedule this referral. If you have not heard from [place] regarding this appointment in 2 weeks please contact our office.  -Check with your last doctor about your previous tetanus vaccine and call my nurse  -check on cost of shingles vaccine with your insurance and let us know if you want this at your physical exam  We recommend the following healthy lifestyle measures: - eat a healthy diet consisting of lots of vegetables, fruits, beans, nuts, seeds, healthy meats such as white chicken and fish and whole grains.  - avoid fried foods, fast food, processed foods, sodas, red meet and other fattening foods.  - get  a least 150 minutes of aerobic exercise per week.   Follow up in: 4-6 months for physical and we will do then or as needed      KIM, HANNAH R.

## 2012-07-30 ENCOUNTER — Ambulatory Visit (INDEPENDENT_AMBULATORY_CARE_PROVIDER_SITE_OTHER): Payer: BC Managed Care – PPO | Admitting: Endocrinology

## 2012-07-30 ENCOUNTER — Encounter: Payer: Self-pay | Admitting: Endocrinology

## 2012-07-30 VITALS — BP 110/70 | HR 62 | Temp 97.9°F | Resp 10 | Ht 62.0 in | Wt 134.1 lb

## 2012-07-30 DIAGNOSIS — I10 Essential (primary) hypertension: Secondary | ICD-10-CM

## 2012-07-30 DIAGNOSIS — R5383 Other fatigue: Secondary | ICD-10-CM

## 2012-07-30 DIAGNOSIS — R5381 Other malaise: Secondary | ICD-10-CM

## 2012-07-30 DIAGNOSIS — M810 Age-related osteoporosis without current pathological fracture: Secondary | ICD-10-CM

## 2012-07-30 HISTORY — DX: Age-related osteoporosis without current pathological fracture: M81.0

## 2012-07-30 LAB — CBC WITH DIFFERENTIAL/PLATELET
Basophils Absolute: 0 10*3/uL (ref 0.0–0.1)
Eosinophils Absolute: 0.1 10*3/uL (ref 0.0–0.7)
HCT: 38.2 % (ref 36.0–46.0)
Hemoglobin: 12.9 g/dL (ref 12.0–15.0)
Lymphs Abs: 1.5 10*3/uL (ref 0.7–4.0)
MCHC: 33.7 g/dL (ref 30.0–36.0)
MCV: 95.8 fl (ref 78.0–100.0)
Monocytes Absolute: 0.3 10*3/uL (ref 0.1–1.0)
Monocytes Relative: 7.2 % (ref 3.0–12.0)
Neutro Abs: 2.5 10*3/uL (ref 1.4–7.7)
Platelets: 315 10*3/uL (ref 150.0–400.0)
RDW: 13.5 % (ref 11.5–14.6)

## 2012-07-30 LAB — T4, FREE: Free T4: 0.75 ng/dL (ref 0.60–1.60)

## 2012-07-30 LAB — COMPREHENSIVE METABOLIC PANEL
ALT: 36 U/L — ABNORMAL HIGH (ref 0–35)
AST: 35 U/L (ref 0–37)
Alkaline Phosphatase: 55 U/L (ref 39–117)
CO2: 33 mEq/L — ABNORMAL HIGH (ref 19–32)
Creatinine, Ser: 0.6 mg/dL (ref 0.4–1.2)
Sodium: 138 mEq/L (ref 135–145)
Total Bilirubin: 0.7 mg/dL (ref 0.3–1.2)
Total Protein: 7.2 g/dL (ref 6.0–8.3)

## 2012-07-30 LAB — TSH: TSH: 2.49 u[IU]/mL (ref 0.35–5.50)

## 2012-07-30 NOTE — Patient Instructions (Addendum)
Rx to be decided,  Atelvia tablets can be used weekly after eating a meal Reclast is given IV yearly

## 2012-07-30 NOTE — Progress Notes (Addendum)
Patient ID: Dawn Reyes, female   DOB: Jul 20, 1950, 62 y.o.   MRN: [id number]   History of Present Illness:  OSTEOPOROSIS:  She was told by her gynecologist in 2006 that she had osteopenia on screening and bone density. She apparently had a T score of -2.0 right right neck of femur but her spine was normal At some point the patient was given a trial of Fosamax which she took for about 2 years but stopped it because of difficulty with lower chest and upper abdominal discomfort when she would take this She also tried this more recently but could not tolerate it for longer than 6 months She also has been taking variable doses of vitamin D for the last few years and also calcium twice daily  In 2011 she was found to have a T score of -2.5 at the hip and no later bone density results are available She was told by her gynecologist to consider Prolia injection but because of practical difficulties she did not start this  Her relevant history includes menopause at the usual age of about 50+. She is not sure about it because of her hysterectomy and she may have had hot flashes around that time She has not had any history of taking any steroid medications No history of fractures No history of kidney stones She thinks she has lost only about a half an inch in height  She is now here for further management  Past Medical History  Diagnosis Date  . Candidiasis of mouth   . Anxiety   . Benign hypertension   . GERD (gastroesophageal reflux disease)   . Hematochezia 02/13/2012  . Left sided abdominal pain 02/13/2012  . Fibromyalgia     Past Surgical History  Procedure Laterality Date  . Abdominal hysterectomy    . Foot surgery      bilateral  . Tonsillectomy    . Breast lumpectomy      Family History  Problem Relation Age of Onset  . Hypertension Father   . Lung cancer Father   . Liver cancer Father   . Lung cancer Mother   . Hypertension Sister   . Osteoporosis Sister   . Hypertension  Brother   . Arthritis Brother     Social History:  reports that she has never smoked. She has never used smokeless tobacco. She reports that she does not drink alcohol or use illicit drugs.  Allergies:  Allergies  Allergen Reactions  . Vicodin (Hydrocodone-Acetaminophen)       Medication List       This list is accurate as of: 07/30/12  8:09 AM.  Always use your most recent med list.               ALPRAZolam 1 MG tablet  Commonly known as:  XANAX  Take 2 mg by mouth at bedtime as needed for sleep.     Coral Calcium 1000 (390 CA) MG Tabs  Take 1 tablet by mouth daily.     escitalopram 10 MG tablet  Commonly known as:  LEXAPRO  Take 1 tablet (10 mg total) by mouth daily.     esomeprazole 20 MG capsule  Commonly known as:  NEXIUM  Take 1 capsule (20 mg total) by mouth daily before breakfast.     hydrochlorothiazide 12.5 MG capsule  Commonly known as:  MICROZIDE  Take 12.5 mg by mouth daily.     Krill Oil 300 MG Caps  Take 1 capsule by mouth daily.  magnesium 30 MG tablet  Take 30 mg by mouth 2 (two) times daily.     multivitamin tablet  Take 1 tablet by mouth daily.     Vitamin D3 2000 UNITS Tabs  Take 1 tablet by mouth daily.         REVIEW OF SYSTEMS:          She has not had any recent weight change No history of change in appetite recently     No unusual headaches.     Thyroid:   Has chronic cold intolerance, has complaints of fatigue.     His blood pressure has been high for about 8 years, on treatment with HCTZ     No swelling of feet.     No shortness of breath on exertion.     Bowel habits:  No change.                    Heartburn/pain: Has had heartburn, on treatment for at least a year       Knee ankle joints hurt.    She also has had muscle aches from fibromyalgia      EXAM:  BP 110/70  Pulse 62  Temp(Src) 97.9 F (36.6 C)  Resp 10  Ht 5\' 2"  (1.575 m)  Wt 134 lb 1.6 oz (60.827 kg)  BMI 24.52 kg/m2  SpO2 97%  GENERAL:  well built and nourished  No pallor, clubbing, lymphadenopathy or edema.  Skin:  no rash or pigmentation.  EYES:  Externally normal.   ENT:  Oral mucosa normal  THYROID:  Not palpable  HEART:  Normal  S1 and S2; no murmur or click.  CHEST:  Normal shape  Lungs:   Vescicular breath sounds heard equally.  No crepitations/ wheeze.  ABDOMEN:  No distention.  Liver and spleen not palpable.  No other mass or tenderness.  NEUROLOGICAL: Motor power normal. Monofilament sensation in feet is                          Vibration sense at toes is                                .Reflexes are absent bilaterally at ankles.  SPINE AND JOINTS:  Normal spine without kyphosis or prominent spinous processes, normal finger joints.   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  If the Osteoporosis is mild she may do well with only oral medications like Atelvia which has less GI side effects than Fosamax Literature given on osteoporosis and Reclast   Aarya Quebedeaux 07/30/2012, 8:09 AM   Office Visit on 07/30/2012  Component Date Value Range Status  . TSH 07/30/2012 2.49  0.35 - 5.50 uIU/mL Final  . Free T4 07/30/2012 0.75  0.60 - 1.60 ng/dL Final  . Sodium 40/98/1191 138  135 - 145 mEq/L Final  . Potassium 07/30/2012  4.2  3.5 - 5.1 mEq/L Final  . Chloride 07/30/2012 100  96 - 112 mEq/L Final  . CO2 07/30/2012 33* 19 - 32 mEq/L Final  . Glucose, Bld 07/30/2012 78  70 - 99 mg/dL Final   . BUN 16/10/9602 14  6 - 23 mg/dL Final  . Creatinine, Ser 07/30/2012 0.6  0.4 - 1.2 mg/dL Final  . Total Bilirubin 07/30/2012 0.7  0.3 - 1.2 mg/dL Final  . Alkaline Phosphatase 07/30/2012 55  39 - 117 U/L Final  . AST 07/30/2012 35  0 - 37 U/L Final  . ALT 07/30/2012 36* 0 - 35 U/L Final  . Total Protein 07/30/2012 7.2  6.0 - 8.3 g/dL Final  . Albumin 54/09/8117 4.2  3.5 - 5.2 g/dL Final  . Calcium 14/78/2956 9.5  8.4 - 10.5 mg/dL Final  . GFR 21/30/8657 105.35  >60.00 mL/min Final  . WBC 07/30/2012 4.5  4.5 - 10.5 K/uL Final  . RBC 07/30/2012 3.99  3.87 - 5.11 Mil/uL Final  . Hemoglobin 07/30/2012 12.9  12.0 - 15.0 g/dL Final  . HCT 84/69/6295 38.2  36.0 - 46.0 % Final  . MCV 07/30/2012 95.8  78.0 - 100.0 fl Final  . MCHC 07/30/2012 33.7  30.0 - 36.0 g/dL Final  . RDW 28/41/3244 13.5  11.5 - 14.6 % Final  . Platelets 07/30/2012 315.0  150.0 - 400.0 K/uL Final  . Neutrophils Relative % 07/30/2012 56.5  43.0 - 77.0 % Final  . Lymphocytes Relative 07/30/2012 33.8  12.0 - 46.0 % Final  . Monocytes Relative 07/30/2012 7.2  3.0 - 12.0 % Final  . Eosinophils Relative 07/30/2012 1.9  0.0 - 5.0 % Final  . Basophils Relative 07/30/2012 0.6  0.0 - 3.0 % Final  . Neutro Abs 07/30/2012 2.5  1.4 - 7.7 K/uL Final  . Lymphs Abs 07/30/2012 1.5  0.7 - 4.0 K/uL Final  . Monocytes Absolute 07/30/2012 0.3  0.1 - 1.0 K/uL Final  . Eosinophils Absolute 07/30/2012 0.1  0.0 - 0.7 K/uL Final  . Basophils Absolute 07/30/2012 0.0  0.0 - 0.1 K/uL Final  . Vit D, 25-Hydroxy 07/30/2012 66  30 - 89 ng/mL Final   Comment: This assay accurately quantifies Vitamin D, which is the sum of the                          25-Hydroxy forms of Vitamin D2 and D3.  Studies have shown that the                          optimum concentration of 25-Hydroxy Vitamin D is 30 ng/mL or higher.                           Concentrations of Vitamin D between 20 and 29 ng/mL are considered to                          be insufficient and  concentrations less than 20 ng/mL are considered                          to be deficient for Vitamin D.   Addendum: Discussed with patient. Since she does not think she has  had a bone density since 2011 will need to do this again to help decide on treatment. Continue  current calcium and vitamin D supplements

## 2012-07-31 LAB — VITAMIN D 25 HYDROXY (VIT D DEFICIENCY, FRACTURES): Vit D, 25-Hydroxy: 66 ng/mL (ref 30–89)

## 2012-08-01 ENCOUNTER — Encounter: Payer: Self-pay | Admitting: Endocrinology

## 2012-08-05 ENCOUNTER — Ambulatory Visit (INDEPENDENT_AMBULATORY_CARE_PROVIDER_SITE_OTHER): Payer: BC Managed Care – PPO | Admitting: Neurology

## 2012-08-05 ENCOUNTER — Encounter: Payer: Self-pay | Admitting: Neurology

## 2012-08-05 VITALS — BP 118/76 | HR 64 | Temp 97.5°F | Ht 62.0 in | Wt 135.0 lb

## 2012-08-05 DIAGNOSIS — G501 Atypical facial pain: Secondary | ICD-10-CM

## 2012-08-05 DIAGNOSIS — M792 Neuralgia and neuritis, unspecified: Secondary | ICD-10-CM

## 2012-08-05 DIAGNOSIS — IMO0002 Reserved for concepts with insufficient information to code with codable children: Secondary | ICD-10-CM

## 2012-08-05 LAB — VITAMIN B12: Vitamin B-12: 538 pg/mL (ref 211–911)

## 2012-08-05 MED ORDER — GABAPENTIN 300 MG PO CAPS: 300.0000 mg | ORAL_CAPSULE | Freq: Every day | ORAL | Status: DC

## 2012-08-05 NOTE — Progress Notes (Signed)
NEUROLOGY CONSULTATION NOTE  Dawn Reyes MRN: [id number] DOB: March 16, 1950   Referring provider: Dr. Selena Batten Primary care provider: Dr. Selena Batten  Reason for consult:  Facial pain  HISTORY OF PRESENT ILLNESS: Dawn Reyes is a 62 y.o. female with history of hypertension, osteoarthritis, depression, and fibromyalgia, who presents for the evaluation of left-sided facial pain in burning tongue. She began noticing symptoms about 6-12 months ago. She notes a "discomfort "on the left side of her face, involving the maxilla to the temple. It is not really a pain. However, she will occasionally have a brief shooting sensation, about a 5/10.  It is sensitive to the touch.  It appears to be sensitive when she is putting on her makeup. She is never in extreme pain however. She also has a burning sensation of her tongue. This also is a 5/10 in intensity. It is less painful when she is eating or drinking. The symptoms correlate soon after the dental procedure involving 2 canals of 2 of her right-sided upper teeth. She did see the dentist for this, who said that her symptoms are unlikely related. She was given a lidocaine mouth rinse which only provided brief relief and has since discontinued it. She denies any visual changes, vertigo, or focal numbness or weakness of the limbs.  Of note, she also has prickly type pain in her left distal foot, correlating with foot surgery several years ago.  It just irritates her.  PAST MEDICAL HISTORY: Past Medical History  Diagnosis Date  . Candidiasis of mouth   . Anxiety   . Benign hypertension   . GERD (gastroesophageal reflux disease)   . Hematochezia 02/13/2012  . Left sided abdominal pain 02/13/2012  . Fibromyalgia     PAST SURGICAL HISTORY: Past Surgical History  Procedure Laterality Date  . Abdominal hysterectomy    . Foot surgery      bilateral  . Tonsillectomy    . Breast lumpectomy      right breast    MEDICATIONS: Current Outpatient Prescriptions on File  Prior to Visit  Medication Sig Dispense Refill  . ALPRAZolam (XANAX) 1 MG tablet Take 2 mg by mouth at bedtime as needed for sleep.      . Cholecalciferol (VITAMIN D3) 2000 UNITS TABS Take 1 tablet by mouth daily.      . Coral Calcium 1000 (390 CA) MG TABS Take 1 tablet by mouth daily.      Marland Kitchen escitalopram (LEXAPRO) 10 MG tablet Take 1 tablet (10 mg total) by mouth daily.  90 tablet  1  . esomeprazole (NEXIUM) 20 MG capsule Take 1 capsule (20 mg total) by mouth daily before breakfast.  30 capsule  11  . hydrochlorothiazide (MICROZIDE) 12.5 MG capsule Take 12.5 mg by mouth daily.      Boris Lown Oil 300 MG CAPS Take 1 capsule by mouth daily.      . magnesium 30 MG tablet Take 30 mg by mouth 2 (two) times daily.      . Multiple Vitamin (MULTIVITAMIN) tablet Take 1 tablet by mouth daily.       No current facility-administered medications on file prior to visit.    ALLERGIES: Allergies  Allergen Reactions  . Vicodin (Hydrocodone-Acetaminophen)     FAMILY HISTORY: Family History  Problem Relation Age of Onset  . Hypertension Father   . Lung cancer Father   . Liver cancer Father   . Lung cancer Mother   . Hypertension Sister   . Osteoporosis Sister   .  Hypertension Brother   . Arthritis Brother   . Diabetes Neg Hx     SOCIAL HISTORY: History   Social History  . Marital Status: Married    Spouse Reyes: N/A    Number of Children: 2  . 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 Smoking status: Never Smoker   . Smokeless tobacco: Never Used  . Alcohol Use: No  . Drug Use: No  . Sexually Active: Not on file   Other Topics Concern  . Not on file   Social History Narrative   Work or School: [employer] for [employer]      Home Situation: lives with husband      Spiritual Beliefs: Baptist      Lifestyle: no regular exercise; diet is ok                REVIEW OF SYSTEMS: Constitutional: No fevers, chills, or sweats, no  generalized fatigue, change in appetite Eyes: No visual changes, double vision, eye pain Ear, nose and throat: No hearing loss, ear pain, nasal congestion, sore throat Cardiovascular: No chest pain, palpitations Respiratory:  No shortness of breath at rest or with exertion, wheezes GastrointestinaI: No nausea, vomiting, diarrhea, abdominal pain, fecal incontinence Genitourinary:  No dysuria, urinary retention or frequency Musculoskeletal:  No neck pain, back pain Integumentary: No rash, pruritus, skin lesions Neurological: as above Psychiatric: No depression, insomnia, anxiety Endocrine: No palpitations, fatigue, diaphoresis, mood swings, change in appetite, change in weight, increased thirst Hematologic/Lymphatic:  No anemia, purpura, petechiae. Allergic/Immunologic: no itchy/runny eyes, nasal congestion, recent allergic reactions, rashes  PHYSICAL EXAM: Filed Vitals:   08/05/12 1513  BP: 118/76  Pulse: 64  Temp: 97.5 F (36.4 C)   General: No acute distress Head:  Normocephalic/atraumatic Neck: supple, no paraspinal tenderness, full range of motion Back: No paraspinal tenderness Heart: regular rate and rhythm Lungs: Clear to auscultation bilaterally. Vascular: No carotid bruits. Neurological Exam: Mental status: alert and oriented to person, place, time and self, speech fluent and not dysarthric, language intact. Cranial nerves: CN I: not tested CN II: pupils equal, round and reactive to light, visual fields intact, fundi unremarkable. CN III, IV, VI:  full range of motion, no nystagmus, no ptosis CN V: facial sensation intact CN VII: upper and lower face symmetric CN VIII: hearing intact CN IX, X: gag intact, uvula midline CN XI: sternocleidomastoid and trapezius muscles intact CN XII: tongue midline Bulk & Tone: normal, no fasciculations. Motor: 5/5 throughout Sensation: temperature and vibration intact Deep Tendon Reflexes: 2+ throughout, toes downgoing. Finger to  nose testing: normal Gait: normal stance and stride, able to walk in tandem. Romberg negative.  IMPRESSION & PLAN: Dawn Reyes is a 62 y.o. 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  She also has neuralgia of the left foot, likely secondary to surgery. 1.  We will try gabapentin 300mg  tablets.  Take 1 tablet at bedtime for one month.  Side effects include sleepiness and dizziness.  Then call with an update to make adjustments if necessary. 2.  Follow up in 3 months. 3.  We will check a vitamin B12 level today to look for signs of B12 deficiency, which can cause tingling and nerve pain as well.  Thank you for allowing me to take part in the care of this patient.  Shon Millet, DO  CC:  Kriste Basque, DO

## 2012-08-05 NOTE — Patient Instructions (Addendum)
It sounds like you have an atypical facial pain, due to an irritated nerve.  An irritated nerve can also cause the tongue burning (burning mouth syndrome). 1.  We will try gabapentin 300mg  tablets.  Take 1 tablet at bedtime for one month.  Side effects include sleepiness and dizziness.  Then call with an update to make adjustments if necessary. 2.  Follow up in 3 months. 3.  We will check a vitamin B12 level today to look for signs of B12 deficiency, which can cause tingling and nerve pain as well.

## 2012-08-06 ENCOUNTER — Encounter: Payer: Self-pay | Admitting: Neurology

## 2012-08-26 ENCOUNTER — Ambulatory Visit (INDEPENDENT_AMBULATORY_CARE_PROVIDER_SITE_OTHER)
Admission: RE | Admit: 2012-08-26 | Discharge: 2012-08-26 | Disposition: A | Discharge status: Home / Self Care | Payer: BC Managed Care – PPO | Source: Ambulatory Visit | Attending: Endocrinology | Admitting: Endocrinology

## 2012-08-26 DIAGNOSIS — M810 Age-related osteoporosis without current pathological fracture: Secondary | ICD-10-CM

## 2012-08-27 ENCOUNTER — Telehealth: Payer: Self-pay | Admitting: Family Medicine

## 2012-08-27 MED ORDER — HYDROCHLOROTHIAZIDE 12.5 MG PO CAPS: 12.5000 mg | ORAL_CAPSULE | Freq: Every day | ORAL | Status: DC

## 2012-08-27 NOTE — Telephone Encounter (Signed)
Rx sent to pharmacy.  Called and spoke with pt and pt is aware.  

## 2012-08-27 NOTE — Telephone Encounter (Signed)
Pt is calling to request a 3 month refill of her hydrochlorothiazide (MICROZIDE) 12.5 MG capsule. She understands that Dr. Selena Batten did not fill this last, but has discussed with her, filling it in the future. She would like this sent to [facility] pharmacy in [place]. [phone]. Please assist.

## 2012-09-04 ENCOUNTER — Telehealth: Payer: Self-pay | Admitting: Endocrinology

## 2012-09-04 NOTE — Telephone Encounter (Signed)
Please see why report not available, let her know it is pending

## 2012-09-04 NOTE — Telephone Encounter (Signed)
Pt had her Bone Density test and would like to know the results.

## 2012-09-11 NOTE — Telephone Encounter (Signed)
Left message for the patient, bone density result given to her She needs to confirm whether she wants to take it down via 35 mg weekly or the Reclast infusion

## 2012-09-12 ENCOUNTER — Telehealth: Payer: Self-pay | Admitting: Endocrinology

## 2012-09-12 NOTE — Telephone Encounter (Signed)
Pt would rather do the reclast, but she wants to check with her insurance company first.

## 2012-09-12 NOTE — Telephone Encounter (Signed)
35mg  weekly, see me in 3 months with BMP

## 2012-09-12 NOTE — Telephone Encounter (Signed)
Please see below.

## 2012-09-12 NOTE — Telephone Encounter (Signed)
Correction of previous note: She can use either a tablet of Atelvia 35 mg weekly or Reclast, please call to see if she has decided

## 2012-09-12 NOTE — Telephone Encounter (Signed)
Will go w/ Atelvia? Please call in script / Sherri S.

## 2012-09-17 ENCOUNTER — Telehealth: Payer: Self-pay | Admitting: Endocrinology

## 2012-09-17 MED ORDER — RISEDRONATE SODIUM 35 MG PO TBEC: 35.0000 mg | DELAYED_RELEASE_TABLET | ORAL | Status: DC

## 2012-09-17 NOTE — Telephone Encounter (Signed)
rx sent, pt is aware 

## 2012-09-17 NOTE — Telephone Encounter (Signed)
rx sent

## 2012-09-20 ENCOUNTER — Ambulatory Visit (INDEPENDENT_AMBULATORY_CARE_PROVIDER_SITE_OTHER): Payer: BC Managed Care – PPO | Admitting: Family Medicine

## 2012-09-20 ENCOUNTER — Encounter: Payer: Self-pay | Admitting: Family Medicine

## 2012-09-20 VITALS — BP 110/80 | Temp 98.2°F | Wt 136.0 lb

## 2012-09-20 DIAGNOSIS — M549 Dorsalgia, unspecified: Secondary | ICD-10-CM

## 2012-09-20 DIAGNOSIS — F329 Major depressive disorder, single episode, unspecified: Secondary | ICD-10-CM

## 2012-09-20 DIAGNOSIS — F32A Depression, unspecified: Secondary | ICD-10-CM

## 2012-09-20 DIAGNOSIS — M797 Fibromyalgia: Secondary | ICD-10-CM

## 2012-09-20 DIAGNOSIS — IMO0001 Reserved for inherently not codable concepts without codable children: Secondary | ICD-10-CM

## 2012-09-20 MED ORDER — CYCLOBENZAPRINE HCL 5 MG PO TABS: 5.0000 mg | ORAL_TABLET | Freq: Three times a day (TID) | ORAL | Status: DC | PRN

## 2012-09-20 NOTE — Patient Instructions (Signed)
-  heat for for 15 minutes twice daily  --As we discussed, we have prescribed a new medication (flexeril)for you at this appointment. We discussed the common and serious potential adverse effects of this medication and you can review these and more with the pharmacist when you pick up your medication.  Please follow the instructions for use carefully and notify us immediately if you have any problems taking this medication.  -tylenol 500-1000mg  up to 3 times daily with topical capsacin or menthol sports cream for pain  -do home exercises at least 5 times per week  -follow up in 3-4 weeks if persists or sooner if other concerns

## 2012-09-20 NOTE — Progress Notes (Signed)
Chief Complaint  Patient presents with  . Back Pain    HPI:  Ms. Dawn Reyes is 62 yo F pt with anxiety/depression followed by Dr. Evelene Croon, fibromyalgia, HTN, GERD, osteoporosis followed by Dr. 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  Past Medical History  Diagnosis Date  . Candidiasis of mouth   . Anxiety   . Benign hypertension   . GERD (gastroesophageal reflux disease)   . Hematochezia 02/13/2012  . Left sided abdominal pain 02/13/2012  . Fibromyalgia     Past Surgical History  Procedure Laterality Date  . Abdominal hysterectomy    . Foot surgery      bilateral  . Tonsillectomy    . Breast lumpectomy      right breast    Family History  Problem Relation Age of Onset  . Hypertension Father   . Lung cancer Father   . Liver cancer Father   . Lung cancer Mother   . Hypertension Sister   . Osteoporosis Sister   . Hypertension Brother   . Arthritis Brother   . Diabetes Neg Hx     History   Social History  . Marital Status: Married    Spouse Name: N/A    Number of Children: 2  . 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 Smoking status: Never Smoker   . Smokeless tobacco: Never Used  . Alcohol Use: No  . Drug Use: No  . Sexual Activity: None   Other Topics Concern  . None   Social History Narrative   Work or School: [employer]       Home Situation: lives with husband      Spiritual Beliefs: Baptist      Lifestyle: no regular exercise; diet is ok                Current outpatient prescriptions:Cholecalciferol (VITAMIN D3) 2000 UNITS TABS, Take 1 tablet by mouth daily., Disp: , Rfl: ;  Coral Calcium 1000 (390 CA) MG TABS, Take 1 tablet by mouth daily., Disp: , Rfl: ;  escitalopram (LEXAPRO) 10 MG tablet, Take 1 tablet (10 mg total) by mouth daily., Disp: 90 tablet, Rfl: 1;  esomeprazole (NEXIUM) 20 MG capsule, Take 1 capsule (20 mg total) by mouth daily before breakfast., Disp: 30 capsule, Rfl: 11 gabapentin (NEURONTIN) 300 MG capsule, Take 1 capsule (300 mg total) by mouth at bedtime., Disp: 30 capsule, Rfl: 2;  hydrochlorothiazide (MICROZIDE) 12.5 MG capsule, Take 1 capsule (12.5 mg total) by mouth daily., Disp: 90 capsule, Rfl: 1;  Krill Oil 300 MG CAPS, Take 1 capsule by mouth daily., Disp: , Rfl: ;  magnesium 30 MG tablet, Take 30 mg by mouth 2 (two) times daily., Disp: , Rfl:  Multiple Vitamin (MULTIVITAMIN) tablet, Take 1 tablet by mouth daily., Disp: , Rfl: ;  Risedronate Sodium (ATELVIA) 35 MG TBEC, Take 1 tablet (35 mg total) by mouth once a week., Disp: 4 tablet, Rfl: 1;  cyclobenzaprine (FLEXERIL) 5 MG tablet, Take 1 tablet (5 mg total) by mouth 3 (three) times daily as needed for muscle spasms., Disp: 30 tablet, Rfl: 1  EXAM:  Filed Vitals:   09/20/12 1012  BP: 110/80  Temp: 98.2 F (36.8 C)    Body mass index is 24.87 kg/(m^2).  GENERAL: vitals reviewed and listed above, alert, oriented, appears well hydrated and in no acute distress  HEENT: atraumatic, conjunttiva clear, no obvious abnormalities on inspection of external nose and ears  NECK: no obvious masses on inspection  LUNGS: clear to auscultation bilaterally, no wheezes, rales or rhonchi, good air movement  CV: HRRR, no peripheral edema  MS: moves all extremities without noticeable abnormality Normal Gait Normal inspection of back,  no obvious scoliosis or leg length descrepancy No bony TTP Soft tissue TTP at: difusely in traps bilat, lower thoracic and upper lumbar paraspinal muscles L >R, lower lumbar paraspinal muscle L >R, R PSIS -/+ tests: neg trendelenburg,-facet loading, -SLRT, -CLRT, -FABER, -FADIR Normal muscle strength, sensation to light touch and DTRs in LEs bilaterally  PSYCH: pleasant and cooperative, no obvious depression or anxiety  ASSESSMENT AND PLAN:  Discussed the following assessment and plan:  Back pain - Plan: cyclobenzaprine (FLEXERIL) 5 MG tablet  Depression and Anxiety - followed by Dr. Westley Chandler in psychiatry  Fibromyalgia - Plan: cyclobenzaprine (FLEXERIL) 5 MG tablet  -no neuro deficits and suspect etiology multifactorial including DDD, facet arthropathy, fibromyalgia and possible left sacroiliitis -discussed options and she would like to start with some conservative treatments and home exercises (provided) -will follow up in 3-4 weeks if symptoms persist and at that time consider formal PT and or PMR referral as spinal injections have helped in the past Recommendations per orders an instructions, risks and use of medications and return precautions discussed. -she reports allergy to hydrocodone and does not want to do any narcotic pain medications   -Patient advised to return or notify a doctor immediately if symptoms worsen or persist or new concerns arise.  Patient Instructions  -heat for for 15 minutes twice daily  --As we discussed, we have prescribed a new medication (flexeril)for you at this appointment. We discussed the common and serious potential adverse effects of this medication and you can review these and more with the pharmacist when you pick up your medication.  Please follow the instructions for use carefully and notify us immediately if you have any problems taking this medication.  -tylenol 500-1000mg  up to 3 times daily with topical capsacin or menthol sports cream for  pain  -do home exercises at least 5 times per week  -follow up in 3-4 weeks if persists or sooner if other concerns      KIM, HANNAH R.

## 2012-10-09 ENCOUNTER — Telehealth: Payer: Self-pay | Admitting: Family Medicine

## 2012-10-09 NOTE — Telephone Encounter (Signed)
pls advise

## 2012-10-09 NOTE — Telephone Encounter (Signed)
Pt states that she would like to move forward with a referral for injections in her back. Please assist.

## 2012-10-14 NOTE — Telephone Encounter (Signed)
Ok to refer to Dr. Bryson Dames (PMR) for back pain. Thanks.

## 2012-10-15 ENCOUNTER — Other Ambulatory Visit: Payer: Self-pay | Admitting: Family Medicine

## 2012-10-15 DIAGNOSIS — M549 Dorsalgia, unspecified: Secondary | ICD-10-CM

## 2012-10-15 NOTE — Telephone Encounter (Signed)
Done

## 2012-10-17 ENCOUNTER — Encounter: Payer: Self-pay | Admitting: Physical Medicine & Rehabilitation

## 2012-10-30 ENCOUNTER — Ambulatory Visit: Payer: BC Managed Care – PPO | Admitting: Neurology

## 2012-10-30 ENCOUNTER — Ambulatory Visit: Payer: BC Managed Care – PPO | Admitting: Endocrinology

## 2012-11-04 ENCOUNTER — Ambulatory Visit (INDEPENDENT_AMBULATORY_CARE_PROVIDER_SITE_OTHER): Payer: BC Managed Care – PPO | Admitting: Neurology

## 2012-11-04 ENCOUNTER — Encounter: Payer: Self-pay | Admitting: Neurology

## 2012-11-04 VITALS — BP 134/74 | HR 80 | Ht 62.0 in | Wt 130.1 lb

## 2012-11-04 DIAGNOSIS — G501 Atypical facial pain: Secondary | ICD-10-CM

## 2012-11-04 DIAGNOSIS — K146 Glossodynia: Secondary | ICD-10-CM

## 2012-11-04 MED ORDER — GABAPENTIN 300 MG PO CAPS: 600.0000 mg | ORAL_CAPSULE | Freq: Every day | ORAL | Status: DC

## 2012-11-04 NOTE — Patient Instructions (Signed)
1.  We will increase the gabapentin 300mg  pills to 2 pills at bedtime.  Call in 3 weeks with an update and further dose adjustments can be made if needed.  Follow up in the office in 3 months.

## 2012-11-04 NOTE — Progress Notes (Signed)
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  Records and images were personally reviewed where available.   She began noticing symptoms about 12 months ago. She notes a "discomfort "on the left side of her face, involving the maxilla to the temple. It is not really a pain. However, she will occasionally have a brief shooting sensation, about a 5/10.  It is sensitive to the touch.  It appears to be sensitive when she is putting on her makeup. She is never in extreme pain however. She also has a burning sensation of her tongue. This also is a 5/10 in intensity. It is less painful when she is eating or drinking. The symptoms correlate soon after the dental procedure involving 2 canals of 2 of her right-sided upper teeth. She did see the dentist for this, who said that her symptoms are unlikely related. She was given a lidocaine mouth rinse which only provided brief relief and has since discontinued it. She denies any visual changes, vertigo, or focal numbness or weakness of the limbs.  Last visit, we started gabapentin 300mg  at bedtime.  She is tolerating the gabapentin at bedtime.  She is hesitant about taking it during the day due to possible side effects such as fatigue.  Since that time, she has noted much improvement in facial pain, but she still notes burning in her tongue.  It's exacerbated by eating.  Swish and swallow has been ineffective.  When it is more severe, she uses ice which is soothing.  PAST MEDICAL HISTORY: Past Medical History  Diagnosis Date  . Candidiasis of mouth   . Anxiety   . Benign hypertension   . GERD (gastroesophageal reflux disease)   . Hematochezia 02/13/2012  . Left sided abdominal pain 02/13/2012  . Fibromyalgia     MEDICATIONS: Current Outpatient  Prescriptions on File Prior to Visit  Medication Sig Dispense Refill  . Cholecalciferol (VITAMIN D3) 2000 UNITS TABS Take 1 tablet by mouth daily.      . Coral Calcium 1000 (390 CA) MG TABS Take 1 tablet by mouth daily.      . cyclobenzaprine (FLEXERIL) 5 MG tablet Take 1 tablet (5 mg total) by mouth 3 (three) times daily as needed for muscle spasms.  30 tablet  1  . hydrochlorothiazide (MICROZIDE) 12.5 MG capsule Take 1 capsule (12.5 mg total) by mouth daily.  90 capsule  1  . Krill Oil 300 MG CAPS Take 1 capsule by mouth daily.      . magnesium 30 MG tablet Take 30 mg by mouth 2 (two) times daily.      . Multiple Vitamin (MULTIVITAMIN) tablet Take 1 tablet by mouth daily.      . Risedronate Sodium (ATELVIA) 35 MG TBEC Take 1 tablet (35 mg total) by mouth once a week.  4 tablet  1   No current facility-administered medications on file prior to visit.    ALLERGIES: Allergies  Allergen Reactions  . Vicodin [Hydrocodone-Acetaminophen]     FAMILY HISTORY: Family History  Problem Relation Age of Onset  . Hypertension Father   . Lung cancer Father   . Liver cancer Father   . Lung cancer Mother   . Hypertension Sister   . Osteoporosis Sister   . Hypertension Brother   . Arthritis Brother   .  Diabetes Neg Hx   . Epilepsy Father     SOCIAL HISTORY: History   Social History  . Marital Status: Married    Spouse Name: N/A    Number of Children: 2  . 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 Smoking status: Never Smoker   . Smokeless tobacco: Never Used  . Alcohol Use: No  . Drug Use: No  . Sexual Activity: Not on file   Other Topics Concern  . Not on file   Social History Narrative   Work or School: [employer]      Home Situation: lives with husband      Spiritual Beliefs: Baptist      Lifestyle: no regular exercise; diet is ok                REVIEW OF SYSTEMS: Constitutional: No fevers, chills, or  sweats, no generalized fatigue, change in appetite Eyes: No visual changes, double vision, eye pain Ear, nose and throat: No hearing loss, ear pain, nasal congestion, sore throat Cardiovascular: No chest pain, palpitations Respiratory:  No shortness of breath at rest or with exertion, wheezes GastrointestinaI: No nausea, vomiting, diarrhea, abdominal pain, fecal incontinence Genitourinary:  No dysuria, urinary retention or frequency Musculoskeletal:  No neck pain, back pain Integumentary: No rash, pruritus, skin lesions Neurological: as above Psychiatric: No depression, insomnia, anxiety Endocrine: No palpitations, fatigue, diaphoresis, mood swings, change in appetite, change in weight, increased thirst Hematologic/Lymphatic:  No anemia, purpura, petechiae. Allergic/Immunologic: no itchy/runny eyes, nasal congestion, recent allergic reactions, rashes  PHYSICAL EXAM: Filed Vitals:   11/04/12 1535  BP: 134/74  Pulse: 80   General: No acute distress Head:  Normocephalic/atraumatic Neck: supple, no paraspinal tenderness, full range of motion Heart:  Regular rate and rhythm Lungs:  Clear to auscultation bilaterally Back: No paraspinal tenderness Neurological Exam: alert and oriented to person, place, and time. Speech fluent and not dysarthric, language intact.  CN II-XII intact. Fundoscopic exam unremarkable, no papilledema.  Bulk and tone normal, muscle strength 5/5 throughout.  Sensation to temperature and vibration intact.  Deep tendon reflexes 2+ throughout.  Finger to nose testing intact.  Gait normal, able to walk in tandem, Romberg negative.  IMPRESSION: 1.  Atypical left-sided facial pain, improved. 2.  Burning mouth syndrome.  PLAN: 1.  Increase gabapentin to 600mg  qhs.  Call in 3 weeks with update.  If ineffective, will consider starting to spread dosage out during the day. 2.  Follow up in 3 months.  Shon Millet, DO  CC:  Dawn Basque, DO

## 2012-11-18 ENCOUNTER — Ambulatory Visit: Payer: BC Managed Care – PPO | Admitting: Physical Medicine & Rehabilitation

## 2012-12-17 ENCOUNTER — Encounter: Payer: BC Managed Care – PPO | Admitting: Family Medicine

## 2012-12-19 ENCOUNTER — Other Ambulatory Visit: Payer: Self-pay | Admitting: *Deleted

## 2012-12-19 MED ORDER — RISEDRONATE SODIUM 35 MG PO TBEC: 35.0000 mg | DELAYED_RELEASE_TABLET | ORAL | Status: DC

## 2012-12-20 ENCOUNTER — Encounter: Payer: Self-pay | Admitting: Family Medicine

## 2012-12-20 ENCOUNTER — Ambulatory Visit (INDEPENDENT_AMBULATORY_CARE_PROVIDER_SITE_OTHER): Payer: BC Managed Care – PPO | Admitting: Family Medicine

## 2012-12-20 VITALS — BP 102/72 | Temp 99.5°F | Ht 62.0 in | Wt 136.0 lb

## 2012-12-20 DIAGNOSIS — Z23 Encounter for immunization: Secondary | ICD-10-CM

## 2012-12-20 DIAGNOSIS — Z0000 Encounter for general adult medical examination without abnormal findings: Secondary | ICD-10-CM

## 2012-12-20 LAB — LDL CHOLESTEROL, DIRECT: Direct LDL: 131.7 mg/dL

## 2012-12-20 LAB — LIPID PANEL: HDL: 71.2 mg/dL (ref >39.00)

## 2012-12-20 NOTE — Progress Notes (Signed)
Chief Complaint  Patient presents with  . Annual Exam    HPI:  Here for CPE:  -Concerns today: none  -taking vit D and calcium: yes  -Exercise/Diet: 2-3 times per week on treadmill; diet healthy  -Diabetes and Dyslipidemia Screening: diabetes screening done, needs lipids today  -Hx of HTN: on hctz  -Vaccines: refused flu, wants to get tdap  -pap history: s/p hysterectomy, n/a  -FDLMP: n/a  -sexual activity: no new partners  -wants STI testing: no  -FH breast, colon or ovarian ca: see FH -mammo June: birads 1 -colonscopy 01/2012 -Alcohol, Tobacco, drug use: see social history  Review of Systems - Review of Systems  Constitutional: Negative for weight loss and malaise/fatigue.  HENT: Negative for hearing loss.   Eyes: Negative for blurred vision.  Respiratory: Negative for cough and shortness of breath.   Cardiovascular: Negative for chest pain and palpitations.  Gastrointestinal: Negative for diarrhea, constipation, blood in stool and melena.  Genitourinary: Negative for dysuria.  Musculoskeletal: Negative for falls.  Skin: Negative for rash.  Neurological: Negative for seizures.  Endo/Heme/Allergies: Does not bruise/bleed easily.  Psychiatric/Behavioral: Negative for depression.     Past Medical History  Diagnosis Date  . Candidiasis of mouth   . Anxiety   . Benign hypertension   . GERD (gastroesophageal reflux disease)   . Hematochezia 02/13/2012  . Left sided abdominal pain 02/13/2012  . Fibromyalgia     Past Surgical History  Procedure Laterality Date  . Abdominal hysterectomy    . Foot surgery Bilateral   . Tonsillectomy    . Breast lumpectomy Right     Family History  Problem Relation Age of Onset  . Hypertension Father   . Lung cancer Father   . Liver cancer Father   . Lung cancer Mother   . Hypertension Sister   . Osteoporosis Sister   . Hypertension Brother   . Arthritis Brother   . Diabetes Neg Hx   . Epilepsy Father     History    Social History  . Marital Status: Married    Spouse Name: N/A    Number of Children: 2  . 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 Smoking status: Never Smoker   . Smokeless tobacco: Never Used  . Alcohol Use: No  . Drug Use: No  . Sexual Activity: None   Other Topics Concern  . None   Social History Narrative   Work or School: [employer]      Home Situation: lives with husband      Spiritual Beliefs: Baptist      Lifestyle: no regular exercise; diet is ok                Current outpatient prescriptions:Cholecalciferol (VITAMIN D3) 2000 UNITS TABS, Take 1 tablet by mouth daily., Disp: , Rfl: ;  Coral Calcium 1000 (390 CA) MG TABS, Take 1 tablet by mouth daily., Disp: , Rfl: ;  gabapentin (NEURONTIN) 300 MG capsule, Take 2 capsules (600 mg total) by mouth at bedtime., Disp: 60 capsule, Rfl: 6;  hydrochlorothiazide (MICROZIDE) 12.5 MG capsule, Take 1 capsule (12.5 mg total) by mouth daily., Disp: 90 capsule, Rfl: 1 Krill Oil 300 MG CAPS, Take 1 capsule by mouth daily., Disp: , Rfl: ;  magnesium 30 MG tablet, Take 30 mg by mouth 2 (two) times daily., Disp: , Rfl: ;  Multiple Vitamin (MULTIVITAMIN) tablet, Take 1 tablet  by mouth daily., Disp: , Rfl: ;  Risedronate Sodium (ATELVIA) 35 MG TBEC, Take 1 tablet (35 mg total) by mouth once a week., Disp: 4 tablet, Rfl: 5  EXAM:  Filed Vitals:   12/20/12 0818  BP: 102/72  Temp: 99.5 F (37.5 C)    GENERAL: vitals reviewed and listed below, alert, oriented, appears well hydrated and in no acute distress  HEENT: head atraumatic, PERRLA, normal appearance of eyes, ears, nose and mouth. moist mucus membranes.  NECK: supple, no masses or lymphadenopathy  LUNGS: clear to auscultation bilaterally, no rales, rhonchi or wheeze  CV: HRRR, no peripheral edema or cyanosis, normal pedal pulses  BREAST: normal appearance - no lesions or discharge, on palpation normal breast  tissue without any suspicious masses  ABDOMEN: bowel sounds normal, soft, non tender to palpation, no masses, no rebound or guarding  GU:  deferred  RECTAL: deferred  SKIN: no rash or abnormal lesions  MS: normal gait, moves all extremities normally  NEURO: CN II-XII grossly intact, normal muscle strength and sensation to light touch on extremities  PSYCH: normal affect, pleasant and cooperative  ASSESSMENT AND PLAN:  Discussed the following assessment and plan:  Visit for preventive health examination - Plan: Lipid Panel   -Discussed and advised all [place] preventive services health task force level A and B recommendations for age, sex and risks.  -Advised at least 150 minutes of exercise per week and a healthy diet low in saturated fats and sweets and consisting of fresh fruits and vegetables, lean meats such as fish and white chicken and whole grains.  -FASTING labs, studies and vaccines per orders this encounter  -tdap  Orders Placed This Encounter  Procedures  . Lipid Panel    There are no Patient Instructions on file for this visit.  Patient advised to return to clinic immediately if symptoms worsen or persist or new concerns.    No Follow-up on file.  [name]

## 2012-12-20 NOTE — Progress Notes (Signed)
Pre visit review using our clinic review tool, if applicable. No additional management support is needed unless otherwise documented below in the visit note. 

## 2012-12-20 NOTE — Addendum Note (Signed)
Addended by: Azucena Freed on: 12/20/2012 09:19 AM   Modules accepted: Orders

## 2012-12-20 NOTE — Patient Instructions (Signed)
-  We have ordered labs or studies at this visit. It can take up to 1-2 weeks for results and processing. We will contact you with instructions IF your results are abnormal. Normal results will be released to your [facility]. If you have not heard from [place] or can not find your results in [facility] in 2 weeks please contact our office.  We recommend the following healthy lifestyle measures: - eat a healthy diet consisting of lots of vegetables, fruits, beans, nuts, seeds, healthy meats such as white chicken and fish and whole grains.  - avoid fried foods, fast food, processed foods, sodas, red meet and other fattening foods.  - get a least 150 minutes of aerobic exercise per week.   Follow up in: 6 mnoths

## 2013-02-04 ENCOUNTER — Ambulatory Visit: Payer: BC Managed Care – PPO | Admitting: Neurology

## 2013-02-05 ENCOUNTER — Encounter: Payer: Self-pay | Admitting: Neurology

## 2013-02-05 ENCOUNTER — Ambulatory Visit (INDEPENDENT_AMBULATORY_CARE_PROVIDER_SITE_OTHER): Payer: BC Managed Care – PPO | Admitting: Neurology

## 2013-02-05 VITALS — BP 140/78 | HR 70 | Temp 98.0°F | Resp 18 | Ht 62.0 in | Wt 137.0 lb

## 2013-02-05 DIAGNOSIS — K146 Glossodynia: Secondary | ICD-10-CM

## 2013-02-05 MED ORDER — GABAPENTIN 300 MG PO CAPS: 300.0000 mg | ORAL_CAPSULE | Freq: Three times a day (TID) | ORAL | Status: DC

## 2013-02-05 NOTE — Patient Instructions (Signed)
1.  Start taking gabapentin, 1 tablet three times daily.  Call in 1 week with update and we can increase dose from there if needed. 2.  Follow up in 3 months.

## 2013-02-06 ENCOUNTER — Encounter: Payer: Self-pay | Admitting: Neurology

## 2013-02-06 DIAGNOSIS — K146 Glossodynia: Secondary | ICD-10-CM | POA: Insufficient documentation

## 2013-02-06 HISTORY — DX: Glossodynia: K14.6

## 2013-02-06 NOTE — Progress Notes (Addendum)
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  Records and images were personally reviewed where available.    UPDATE: On the gabapentin 600mg  at bedtime.  The facial pain has all but resolved.  However, she still has the burning sensation on her tongue.  It occurs throughout the day.  HISTORY: She began noticing symptoms last year. The symptoms correlate soon after the dental procedure involving 2 canals of 2 of her right-sided upper teeth. She did see the dentist for this, who said that her symptoms are unlikely related.  She notes a "discomfort "on the left side of her face, involving the maxilla to the temple. It is not really a pain. However, she will occasionally have a brief shooting sensation, about a 5/10.  It is sensitive to the touch.  It appears to be sensitive when she is putting on her makeup. She is never in extreme pain however. She also has a burning sensation of her tongue. This also is a 5/10 in intensity. It is less painful when she is eating or drinking.   Lidocaine mouth rinse provided only brief relief.  Ice is soothing.  Currently taking gabapentin 600mg  at bedtime.  PAST MEDICAL HISTORY: Past Medical History  Diagnosis Date  . Candidiasis of mouth   . Anxiety   . Benign hypertension   . GERD (gastroesophageal reflux disease)   . Hematochezia 02/13/2012  . Left sided abdominal pain 02/13/2012  . Fibromyalgia     MEDICATIONS: Current Outpatient Prescriptions on File Prior to Visit  Medication Sig Dispense Refill  . Cholecalciferol (VITAMIN D3) 2000 UNITS TABS Take 1 tablet by mouth daily.      . Coral Calcium 1000 (390 CA) MG TABS Take 1 tablet by mouth daily.      . hydrochlorothiazide (MICROZIDE) 12.5 MG capsule Take 1 capsule (12.5 mg total) by  mouth daily.  90 capsule  1  . Krill Oil 300 MG CAPS Take 1 capsule by mouth daily.      . magnesium 30 MG tablet Take 30 mg by mouth 2 (two) times daily.      . Multiple Vitamin (MULTIVITAMIN) tablet Take 1 tablet by mouth daily.      . Risedronate Sodium (ATELVIA) 35 MG TBEC Take 1 tablet (35 mg total) by mouth once a week.  4 tablet  5   No current facility-administered medications on file prior to visit.    ALLERGIES: Allergies  Allergen Reactions  . Vicodin [Hydrocodone-Acetaminophen]     FAMILY HISTORY: Family History  Problem Relation Age of Onset  . Hypertension Father   . Lung cancer Father   . Liver cancer Father   . Lung cancer Mother   . Hypertension Sister   . Osteoporosis Sister   . Hypertension Brother   . Arthritis Brother   . Diabetes Neg Hx   . Epilepsy Father     SOCIAL HISTORY: History   Social History  . Marital Status: Married    Spouse Name: N/A    Number of Children: 2  . 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 Smoking status: Never Smoker   . Smokeless tobacco: Never Used  . Alcohol Use: No  . Drug Use: No  . Sexual  Activity: Not on file   Other Topics Concern  . Not on file   Social History Narrative   Work or School: [employer]      Home Situation: lives with husband      Spiritual Beliefs: Baptist      Lifestyle: no regular exercise; diet is ok                REVIEW OF SYSTEMS: Constitutional: No fevers, chills, or sweats, no generalized fatigue, change in appetite Eyes: No visual changes, double vision, eye pain Ear, nose and throat: No hearing loss, ear pain, nasal congestion, sore throat Cardiovascular: No chest pain, palpitations Respiratory:  No shortness of breath at rest or with exertion, wheezes GastrointestinaI: No nausea, vomiting, diarrhea, abdominal pain, fecal incontinence Genitourinary:  No dysuria, urinary retention or  frequency Musculoskeletal:  No neck pain, back pain Integumentary: No rash, pruritus, skin lesions Neurological: as above Psychiatric: No depression, insomnia, anxiety Endocrine: No palpitations, fatigue, diaphoresis, mood swings, change in appetite, change in weight, increased thirst Hematologic/Lymphatic:  No anemia, purpura, petechiae. Allergic/Immunologic: no itchy/runny eyes, nasal congestion, recent allergic reactions, rashes  PHYSICAL EXAM: Filed Vitals:   02/05/13 1514  BP: 140/78  Pulse: 70  Temp: 98 F (36.7 C)  Resp: 18   General: No acute distress Head:  Normocephalic/atraumatic  IMPRESSION: Burning tongue syndrome  PLAN: 1.  Instructed to start taking gabapentin 300mg  three times daily and will further titrate up to 600mg  three times daily if needed. 2.  Follow up in 3 months  15 minutes spent with patient, 100% spent counseling and coordinating care.  Metta Clines, DO  CC:  Colin Benton, DO

## 2013-02-14 ENCOUNTER — Telehealth: Payer: Self-pay | Admitting: Neurology

## 2013-02-16 ENCOUNTER — Encounter (HOSPITAL_COMMUNITY): Payer: Self-pay | Admitting: Emergency Medicine

## 2013-02-16 ENCOUNTER — Emergency Department (HOSPITAL_COMMUNITY)
Admission: EM | Admit: 2013-02-16 | Discharge: 2013-02-16 | Disposition: A | Discharge status: Home / Self Care | Payer: BC Managed Care – PPO | Attending: Emergency Medicine | Admitting: Emergency Medicine

## 2013-02-16 DIAGNOSIS — M542 Cervicalgia: Secondary | ICD-10-CM

## 2013-02-16 DIAGNOSIS — Y92410 Unspecified street and highway as the place of occurrence of the external cause: Secondary | ICD-10-CM | POA: Insufficient documentation

## 2013-02-16 DIAGNOSIS — I10 Essential (primary) hypertension: Secondary | ICD-10-CM | POA: Insufficient documentation

## 2013-02-16 DIAGNOSIS — Z8719 Personal history of other diseases of the digestive system: Secondary | ICD-10-CM | POA: Insufficient documentation

## 2013-02-16 DIAGNOSIS — Y9389 Activity, other specified: Secondary | ICD-10-CM | POA: Insufficient documentation

## 2013-02-16 DIAGNOSIS — S199XXA Unspecified injury of neck, initial encounter: Principal | ICD-10-CM

## 2013-02-16 DIAGNOSIS — Z8619 Personal history of other infectious and parasitic diseases: Secondary | ICD-10-CM | POA: Insufficient documentation

## 2013-02-16 DIAGNOSIS — Z7983 Long term (current) use of bisphosphonates: Secondary | ICD-10-CM | POA: Insufficient documentation

## 2013-02-16 DIAGNOSIS — Z79899 Other long term (current) drug therapy: Secondary | ICD-10-CM | POA: Insufficient documentation

## 2013-02-16 DIAGNOSIS — F411 Generalized anxiety disorder: Secondary | ICD-10-CM | POA: Insufficient documentation

## 2013-02-16 DIAGNOSIS — S0993XA Unspecified injury of face, initial encounter: Secondary | ICD-10-CM | POA: Insufficient documentation
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
## 2013-02-16 NOTE — ED Notes (Signed)
Patient log rolled from backboard by three RNs. Cervical tenderness, thoracic tenderness. No step offs noted. Patient alert/oriented x 4. C/o headache.

## 2013-02-16 NOTE — ED Notes (Signed)
MD at bedside. 

## 2013-02-16 NOTE — ED Notes (Signed)
Patient with no complaints at this time. Respirations even and unlabored. Skin warm/dry. Discharge instructions reviewed with patient at this time. Patient given opportunity to voice concerns/ask questions. Patient discharged at this time and left Emergency Department with steady gait.   
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
## 2013-02-17 ENCOUNTER — Telehealth: Payer: Self-pay | Admitting: Family Medicine
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
## 2013-02-18 NOTE — Telephone Encounter (Signed)
Pt went to ER

## 2013-02-19 ENCOUNTER — Encounter: Payer: Self-pay | Admitting: Family Medicine

## 2013-02-19 ENCOUNTER — Ambulatory Visit (INDEPENDENT_AMBULATORY_CARE_PROVIDER_SITE_OTHER): Payer: BC Managed Care – PPO | Admitting: Family Medicine

## 2013-02-19 VITALS — BP 124/80 | Temp 98.6°F | Wt 138.0 lb

## 2013-02-19 DIAGNOSIS — S134XXA Sprain of ligaments of cervical spine, initial encounter: Secondary | ICD-10-CM

## 2013-02-19 DIAGNOSIS — S139XXA Sprain of joints and ligaments of unspecified parts of neck, initial encounter: Secondary | ICD-10-CM

## 2013-02-19 DIAGNOSIS — H21569 Pupillary abnormality, unspecified eye: Secondary | ICD-10-CM

## 2013-02-19 DIAGNOSIS — M542 Cervicalgia: Secondary | ICD-10-CM

## 2013-02-19 MED ORDER — CYCLOBENZAPRINE HCL 5 MG PO TABS: 5.0000 mg | ORAL_TABLET | Freq: Three times a day (TID) | ORAL | Status: DC | PRN
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
## 2013-02-19 NOTE — Patient Instructions (Signed)
-  heat 15 minutes twice per day  -topical sports creams  -flexeril as instructed   -tylenol 500-1000mg  up to 3 times per day or ibuprofen 400mg  up to twice daily  -see a doctor immediately if worsening HA, severe HA, vision changes, vomiting, other concerns and if not getting better

## 2013-02-19 NOTE — Progress Notes (Signed)
Pre visit review using our clinic review tool, if applicable. No additional management support is needed unless otherwise documented below in the visit note. 

## 2013-03-18 ENCOUNTER — Other Ambulatory Visit: Payer: Self-pay | Admitting: Neurology

## 2013-03-18 MED ORDER — GABAPENTIN 300 MG PO CAPS: 300.0000 mg | ORAL_CAPSULE | Freq: Three times a day (TID) | ORAL | Status: DC

## 2013-05-09 ENCOUNTER — Ambulatory Visit: Payer: BC Managed Care – PPO | Admitting: Neurology

## 2013-05-12 ENCOUNTER — Other Ambulatory Visit: Payer: Self-pay

## 2013-05-12 ENCOUNTER — Telehealth: Payer: Self-pay | Admitting: Family Medicine

## 2013-05-12 MED ORDER — HYDROCHLOROTHIAZIDE 12.5 MG PO CAPS: 12.5000 mg | ORAL_CAPSULE | Freq: Every day | ORAL | Status: DC

## 2013-05-12 NOTE — Telephone Encounter (Signed)
[name] is requesting re-fill on hydrochlorothiazide (MICROZIDE) 12.5 MG capsule

## 2013-05-20 ENCOUNTER — Ambulatory Visit (INDEPENDENT_AMBULATORY_CARE_PROVIDER_SITE_OTHER): Payer: BC Managed Care – PPO | Admitting: Neurology

## 2013-05-20 ENCOUNTER — Encounter: Payer: Self-pay | Admitting: Neurology

## 2013-05-20 VITALS — BP 128/80 | HR 70 | Temp 98.2°F | Resp 16 | Ht 62.0 in | Wt 140.6 lb

## 2013-05-20 DIAGNOSIS — K146 Glossodynia: Secondary | ICD-10-CM

## 2013-05-20 NOTE — Patient Instructions (Signed)
1.  We will titrate up on the gabapentin 300mg :  Take 1 pill in morning, 1 pill in afternoon and 2 pills at bedtime for 7 days  Then 2 pills in morning, 1 pill in afternoon, and 2 pills at bedtime for 7 days  Then 2 pills three times daily. 2.  Call in 4 weeks with update. 3.  Follow up in 3 months.
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
## 2013-06-09 ENCOUNTER — Telehealth: Payer: Self-pay | Admitting: Neurology

## 2013-06-09 ENCOUNTER — Other Ambulatory Visit: Payer: Self-pay | Admitting: *Deleted

## 2013-06-09 NOTE — Telephone Encounter (Signed)
Pt called to give an update on Neurontin 100mg  2 tablets 3 times a day Update: Pt can see some change for the better. Pt is also requesting a refill

## 2013-06-09 NOTE — Telephone Encounter (Signed)
Okay to refill with 3 extra refills.

## 2013-06-09 NOTE — Telephone Encounter (Signed)
Please advise on below medication refill

## 2013-06-20 ENCOUNTER — Encounter: Payer: Self-pay | Admitting: Family Medicine

## 2013-06-20 ENCOUNTER — Ambulatory Visit (INDEPENDENT_AMBULATORY_CARE_PROVIDER_SITE_OTHER): Payer: BC Managed Care – PPO | Admitting: Family Medicine

## 2013-06-20 ENCOUNTER — Telehealth: Payer: Self-pay | Admitting: Neurology

## 2013-06-20 VITALS — BP 102/70 | HR 72 | Temp 98.6°F | Ht 62.0 in | Wt 138.5 lb

## 2013-06-20 DIAGNOSIS — F329 Major depressive disorder, single episode, unspecified: Secondary | ICD-10-CM

## 2013-06-20 DIAGNOSIS — F3289 Other specified depressive episodes: Secondary | ICD-10-CM

## 2013-06-20 DIAGNOSIS — I10 Essential (primary) hypertension: Secondary | ICD-10-CM

## 2013-06-20 DIAGNOSIS — F32A Depression, unspecified: Secondary | ICD-10-CM

## 2013-06-20 DIAGNOSIS — K146 Glossodynia: Secondary | ICD-10-CM

## 2013-06-20 DIAGNOSIS — M810 Age-related osteoporosis without current pathological fracture: Secondary | ICD-10-CM

## 2013-06-20 DIAGNOSIS — M792 Neuralgia and neuritis, unspecified: Secondary | ICD-10-CM

## 2013-06-20 DIAGNOSIS — IMO0002 Reserved for concepts with insufficient information to code with codable children: Secondary | ICD-10-CM

## 2013-06-20 DIAGNOSIS — R51 Headache: Secondary | ICD-10-CM

## 2013-06-20 NOTE — Telephone Encounter (Signed)
Pt left sided headache from Sunday to today. It has gone away today but she wanted to let Dr. Tomi Likens know. CB# [phone] / Sherri S.

## 2013-06-20 NOTE — Progress Notes (Signed)
No chief complaint on file.   HPI:  Follow up:  1) HTN: -meds: hctz 12.5 mg daily -reports: doing well -denies: CP, SOB, swelling  2)Anxiety and Depression: -sees Dr. Toy Care for this -reports: no longer seeing psychiatrist as is doing great -denies: depression and anxiety  3)Osteoporosis: -reports seeing Dr. Dwyane Dee for this - but she stopped her medication  4)Burning tongue syndrome: -followed by neurologist - recently has increased neurontin to 600mg  tid and is doing ok   -had a headache last week on L side that was sharp in L parietal area accompanied by - felt like balance was off and felt like vision in L eye a little off, nausea - then resolved on its own after 3 - 4 days -feels fine today -denied: weakness, vomiting, numbness, visual disturbance or headache today   ROS: See pertinent positives and negatives per HPI.  Past Medical History  Diagnosis Date  . Candidiasis of mouth   . Anxiety   . Benign hypertension   . GERD (gastroesophageal reflux disease)   . Hematochezia 02/13/2012  . Left sided abdominal pain 02/13/2012  . Fibromyalgia   . 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 Osteoporosis, unspecified 07/30/2012  . Depression and Anxiety - 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 07/16/2012    Past Surgical History  Procedure Laterality Date  . Abdominal hysterectomy    . Foot surgery Bilateral   . Tonsillectomy    . Breast lumpectomy Right     Family History  Problem Relation Age of Onset  . Hypertension Father   . Lung cancer Father   . Liver cancer Father   . Lung cancer Mother   . Hypertension Sister   . Osteoporosis Sister   . Hypertension Brother   . Arthritis Brother   . Diabetes Neg Hx   . Epilepsy Father     History   Social History  . Marital Status: Married    Spouse Name: N/A    Number of Children: 2  . 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 Smoking status: Never Smoker   . Smokeless  tobacco: Never Used  . Alcohol Use: No  . Drug Use: No  . Sexual Activity: None   Other Topics Concern  . None   Social History Narrative   Work or School: [employer]      Home Situation: lives with husband      Spiritual Beliefs: Baptist      Lifestyle: no regular exercise; diet is ok                Current outpatient prescriptions:CALCIUM PO, Take by mouth., Disp: , Rfl: ;  Cholecalciferol (VITAMIN D3) 2000 UNITS TABS, Take 1 tablet by mouth daily., Disp: , Rfl: ;  cyclobenzaprine (FLEXERIL) 5 MG tablet, Take 1 tablet (5 mg total) by mouth 3 (three) times daily as needed for muscle spasms., Disp: 30 tablet, Rfl: 1;  gabapentin (NEURONTIN) 300 MG capsule, Take 1 capsule (300 mg total) by mouth 3 (three) times daily., Disp: 90 capsule, Rfl: 11 hydrochlorothiazide (MICROZIDE) 12.5 MG capsule, Take 1 capsule (12.5 mg total) by mouth daily., Disp: 90 capsule, Rfl: 5;  Krill Oil 300 MG CAPS, Take 1 capsule by mouth daily., Disp: , Rfl: ;  magnesium 30 MG tablet, Take 30 mg by mouth 2 (two) times daily., Disp: , Rfl: ;  Multiple Vitamin (MULTIVITAMIN) tablet, Take 1 tablet by mouth  daily., Disp: , Rfl:   EXAM:  Filed Vitals:   06/20/13 1523  BP: 102/70  Pulse: 72  Temp: 98.6 F (37 C)    Body mass index is 25.33 kg/(m^2).  GENERAL: vitals reviewed and listed above, alert, oriented, appears well hydrated and in no acute distress  HEENT: atraumatic, conjunttiva clear,PERRLA, visual acuity grossly intact, EOMI,  no obvious abnormalities on inspection of external nose and ears, normal appearance of ear canals and TMs, normal oral mucosa  NECK: no obvious masses on inspection  LUNGS: clear to auscultation bilaterally, no wheezes, rales or rhonchi, good air movement  CV: HRRR, no peripheral edema  MS: moves all extremities without noticeable abnormality  PSYCH: pleasant and cooperative, no obvious depression or anxiety  NEURO: CN II-XII grossly intact, finger to  nose normal  ASSESSMENT AND PLAN:  Discussed the following assessment and plan:  Depression and Anxiety - 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  Essential hypertension, benign  Osteoporosis, unspecified  Burning tongue syndrome  Headache(784.0)  Neuralgia  -continue current treatment for BP - well controlled -discussed options for osteoporosis treatment and advised she follow up with her endocrinologist if wishes to continue treatment - otherwise calcium, vit d and weight bearing exercise at minimum -advised she call today for follow up with her neurologist about the recent l sided headache and vision change and chronic L tongue pain, facial neuralgia - she agreed to call her neurologist -follow up with me in 2-3 months -Patient advised to return or notify a doctor immediately if symptoms worsen or persist or new concerns arise.  Patient Instructions  -please call to schedule an appointment with your neurologist about the headache - if this happens again seek emergency care immediately  -follow up with the endocrinology office about the osteoporosis     Devereaux Grayson, Jarrett Soho R.

## 2013-06-20 NOTE — Patient Instructions (Signed)
-  please call to schedule an appointment with your neurologist about the headache - if this happens again seek emergency care immediately  -follow up with the endocrinology office about the osteoporosis

## 2013-06-20 NOTE — Progress Notes (Signed)
Pre visit review using our clinic review tool, if applicable. No additional management support is needed unless otherwise documented below in the visit note. 

## 2013-06-23 NOTE — Telephone Encounter (Signed)
If it's gone away, I would just monitor for now.  If it returns, she should call us back.  If it returns and it is severe or she has other symptoms, such as unilateral numbness or weakness, then she should go to the ED.

## 2013-06-23 NOTE — Telephone Encounter (Signed)
I spoke with patient and she is aware  That is headache is better now to monitor if it changes and becomes severe she should go to ED asap

## 2013-06-23 NOTE — Telephone Encounter (Signed)
Please see below and advise.

## 2013-07-10 ENCOUNTER — Encounter: Payer: Self-pay | Admitting: Endocrinology

## 2013-07-10 ENCOUNTER — Ambulatory Visit (INDEPENDENT_AMBULATORY_CARE_PROVIDER_SITE_OTHER): Payer: BC Managed Care – PPO | Admitting: Endocrinology

## 2013-07-10 VITALS — BP 122/82 | HR 74 | Temp 98.1°F | Ht 62.0 in | Wt 137.0 lb

## 2013-07-10 DIAGNOSIS — M810 Age-related osteoporosis without current pathological fracture: Secondary | ICD-10-CM

## 2013-07-10 LAB — RENAL FUNCTION PANEL
Albumin: 4.4 g/dL (ref 3.5–5.2)
BUN: 11 mg/dL (ref 6–23)
CALCIUM: 9.3 mg/dL (ref 8.4–10.5)
CO2: 32 meq/L (ref 19–32)
CREATININE: 0.8 mg/dL (ref 0.4–1.2)
Chloride: 97 mEq/L (ref 96–112)
GFR: 76.81 mL/min (ref >60.00)
Glucose, Bld: 94 mg/dL (ref 70–99)
POTASSIUM: 3.6 meq/L (ref 3.5–5.1)
Phosphorus: 3.7 mg/dL (ref 2.3–4.6)
Sodium: 138 mEq/L (ref 135–145)

## 2013-07-10 NOTE — Progress Notes (Signed)
Patient ID: Dawn Reyes, female   DOB: 02-27-1950, 63 y.o.   MRN: [id number]   History of Present Illness:  OSTEOPOROSIS:  She was told by her gynecologist in 2006 that she had osteopenia on screening and bone density.  She  had a T score of -2.0 at the right right neck of femur but her spine was normal At some point the patient was given a trial of Fosamax which she took for about 2 years but stopped it because of difficulty with lower chest and upper abdominal discomfort when she would take this She also tried this again in 2014 but could not tolerate it for longer than 6 months She also has been taking variable doses of vitamin D for the last few years and also calcium twice daily  Background history includes menopause at the usual age of about 72+. She is not sure about it because of her hysterectomy and she may have had hot flashes around that time She has not had any history of taking any steroid medications No history of fractures No history of kidney stones  In 2011 she was found to have a T score of -2.5 at the hip and  subsequently this was -2.7 at the left hip in 2014 She was told by her gynecologist to consider Prolia injection However she agreed to take Atelvia in 2014 and she took this for 3-4 months but since she could not get a refill on this he did not continue it and is here for further management She thinks she has lost only about a half an inch in height since her youth and 9 in the last year No significant back pain and no recent history of fracture    Past Medical History  Diagnosis Date  . Candidiasis of mouth   . Anxiety   . Benign hypertension   . GERD (gastroesophageal reflux disease)   . Hematochezia 02/13/2012  . Left sided abdominal pain 02/13/2012  . Fibromyalgia   . 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 Osteoporosis, unspecified 07/30/2012  . Depression and Anxiety - 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 07/16/2012    Past Surgical History  Procedure  Laterality Date  . Abdominal hysterectomy    . Foot surgery Bilateral   . Tonsillectomy    . Breast lumpectomy Right     Family History  Problem Relation Age of Onset  . Hypertension Father   . Lung cancer Father   . Liver cancer Father   . Lung cancer Mother   . Hypertension Sister   . Osteoporosis Sister   . Hypertension Brother   . Arthritis Brother   . Diabetes Neg Hx   . Epilepsy Father     Social History:  reports that she has never smoked. She has never used smokeless tobacco. She reports that she does not drink alcohol or use illicit drugs.  Allergies:  Allergies  Allergen Reactions  . Vicodin [Hydrocodone-Acetaminophen] Itching      Medication List       This list is accurate as of: 07/10/13  8:05 AM.  Always use your most recent med list.               CALCIUM PO  Take by mouth.     cyclobenzaprine 5 MG tablet  Commonly known as:  FLEXERIL  Take 1 tablet (5 mg total) by mouth 3 (three) times daily as needed for muscle spasms.     gabapentin 300 MG capsule  Commonly known as:  NEURONTIN  Take 1 capsule (300 mg total) by mouth 3 (three) times daily.     hydrochlorothiazide 12.5 MG capsule  Commonly known as:  MICROZIDE  Take 1 capsule (12.5 mg total) by mouth daily.     Krill Oil 300 MG Caps  Take 1 capsule by mouth daily.     magnesium 30 MG tablet  Take 30 mg by mouth 2 (two) times daily.     multivitamin tablet  Take 1 tablet by mouth daily.     Vitamin D3 2000 UNITS Tabs  Take 1 tablet by mouth daily.         REVIEW OF SYSTEMS:            Thyroid:   Has chronic cold intolerance, has complaints of fatigue.  She also has had muscle aches from fibromyalgia      EXAM:  BP 122/82  Pulse 74  Temp(Src) 98.1 F (36.7 C) (Oral)  Ht 5\' 2"  (1.575 m)  Wt 137 lb (62.143 kg)  BMI 25.05 kg/m2  SpO2 95%   ASSESSMENT:   History of osteoporosis/osteopenia , long-standing and clinically asymptomatic She has mild osteoporosis at the  left neck of the femur with T score -2.7 in 2014 and needs further treatment Although she had tolerated Atelvia she does not want to do this again because she tends to forget when to take this Clinically she is doing well Vitamin D level was adequate in 2014 as also general tests   PLAN:   Literature given on osteoporosis and Reclast and will schedule this  Dawn Reyes 07/10/2013, 8:05 AM

## 2013-07-15 ENCOUNTER — Other Ambulatory Visit: Payer: Self-pay | Admitting: *Deleted

## 2013-07-15 DIAGNOSIS — M810 Age-related osteoporosis without current pathological fracture: Secondary | ICD-10-CM

## 2013-07-16 ENCOUNTER — Other Ambulatory Visit (INDEPENDENT_AMBULATORY_CARE_PROVIDER_SITE_OTHER): Payer: BC Managed Care – PPO

## 2013-07-16 ENCOUNTER — Other Ambulatory Visit: Payer: Self-pay | Admitting: *Deleted

## 2013-07-16 DIAGNOSIS — M810 Age-related osteoporosis without current pathological fracture: Secondary | ICD-10-CM

## 2013-07-16 LAB — COMPREHENSIVE METABOLIC PANEL
ALT: 20 U/L (ref 0–35)
AST: 26 U/L (ref 0–37)
Albumin: 4.3 g/dL (ref 3.5–5.2)
Alkaline Phosphatase: 51 U/L (ref 39–117)
BUN: 12 mg/dL (ref 6–23)
CALCIUM: 9.5 mg/dL (ref 8.4–10.5)
CHLORIDE: 100 meq/L (ref 96–112)
CO2: 32 meq/L (ref 19–32)
Creatinine, Ser: 0.7 mg/dL (ref 0.4–1.2)
GFR: 91.1 mL/min (ref >60.00)
Glucose, Bld: 96 mg/dL (ref 70–99)
POTASSIUM: 4 meq/L (ref 3.5–5.1)
Sodium: 139 mEq/L (ref 135–145)
Total Bilirubin: 0.4 mg/dL (ref 0.2–1.2)
Total Protein: 7.3 g/dL (ref 6.0–8.3)

## 2013-07-17 ENCOUNTER — Telehealth: Payer: Self-pay | Admitting: Endocrinology

## 2013-07-17 NOTE — Telephone Encounter (Signed)
Patient is returning your call from yesterday about making her and appointment.  Phone # to call before 4:00, [phone]

## 2013-07-18 NOTE — Telephone Encounter (Signed)
Scheduled for 7/31 @ 11 a.m

## 2013-08-07 ENCOUNTER — Other Ambulatory Visit (HOSPITAL_COMMUNITY): Payer: Self-pay | Admitting: Endocrinology

## 2013-08-08 ENCOUNTER — Encounter (HOSPITAL_COMMUNITY): Payer: Self-pay

## 2013-08-08 ENCOUNTER — Ambulatory Visit (HOSPITAL_COMMUNITY)
Admission: RE | Admit: 2013-08-08 | Discharge: 2013-08-08 | Disposition: A | Discharge status: Home / Self Care | Payer: BC Managed Care – PPO | Source: Ambulatory Visit | Attending: Endocrinology | Admitting: Endocrinology

## 2013-08-08 DIAGNOSIS — M810 Age-related osteoporosis without current pathological fracture: Secondary | ICD-10-CM | POA: Insufficient documentation

## 2013-08-08 MED ORDER — ZOLEDRONIC ACID 5 MG/100ML IV SOLN
5.0000 mg | Freq: Once | INTRAVENOUS | Status: AC
  Administered 2013-08-08: 5 mg via INTRAVENOUS
  Filled 2013-08-08: qty 100

## 2013-08-08 MED ORDER — ZOLEDRONIC ACID 5 MG/100ML IV SOLN: 5.0000 mg | Freq: Once | INTRAVENOUS | Status: AC

## 2013-08-08 MED ORDER — SODIUM CHLORIDE 0.9 % IV SOLN
250.0000 mL | Freq: Once | INTRAVENOUS | Status: AC
  Administered 2013-08-08: 250 mL via INTRAVENOUS

## 2013-08-08 NOTE — Discharge Instructions (Signed)

## 2013-08-20 ENCOUNTER — Ambulatory Visit: Payer: BC Managed Care – PPO | Admitting: Neurology

## 2013-08-26 ENCOUNTER — Ambulatory Visit (INDEPENDENT_AMBULATORY_CARE_PROVIDER_SITE_OTHER): Payer: BC Managed Care – PPO | Admitting: Neurology

## 2013-08-26 ENCOUNTER — Encounter: Payer: Self-pay | Admitting: Neurology

## 2013-08-26 VITALS — BP 122/66 | HR 68 | Resp 14 | Ht 62.0 in | Wt 139.0 lb

## 2013-08-26 DIAGNOSIS — R519 Headache, unspecified: Secondary | ICD-10-CM

## 2013-08-26 DIAGNOSIS — K146 Glossodynia: Secondary | ICD-10-CM

## 2013-08-26 DIAGNOSIS — R51 Headache: Secondary | ICD-10-CM

## 2013-08-26 NOTE — Patient Instructions (Signed)
1.  Continue the gabapentin. 2.  If the headache doesn't improve over the course of this week, call Friday morning and you can come in for an injection.  You will need somebody to drive you, however, because of sleepiness. 3.  Call or go to ED if headache becomes severe or you experience new symptoms 4.  Follow up in 6 months or as needed.

## 2013-08-26 NOTE — Progress Notes (Signed)
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  The neuralgia has been controlled.    She has had new onset headache.  In June, she had a left sided headache for 6 days which spontaneously resolved.  It was located just left to the top of the head and in the temple.  It's an aching and throbbing quality.  She noted some photosensitivity. It was associated with spinning sensation.  No visual disturbance, nausea, autonomic symptoms or focal numbness or weakness.  She hasn't had any other headaches until this morning.  It gradually progressed over the course of the day.  She took one Aleve this morning and later two Tylenol, which has not helped.  It is a moderate 4/10 intensity.  There are no triggers or relieving factors.  She has no history of headache.  HISTORY: She began noticing symptoms last year. The symptoms correlate soon after the dental procedure involving 2 canals of 2 of her right-sided upper teeth. She did see the dentist for this, who said that her symptoms are unlikely related.  She notes a "discomfort "on the left side of her face, involving the maxilla to the temple. It is not really a pain. However, she will occasionally have a brief shooting sensation, about a 5/10.  It is sensitive to the touch.  It appears to be sensitive when she is putting on her makeup. She is never in extreme pain however. She also has a burning sensation of her tongue. This also is a 5/10 in intensity. It is less painful when she is eating or drinking.   Lidocaine mouth rinse provided only brief relief.  Ice is soothing.    PAST MEDICAL HISTORY: Past Medical History  Diagnosis Date  . Candidiasis of mouth   . Anxiety   .  Benign hypertension   . GERD (gastroesophageal reflux disease)   . Hematochezia 02/13/2012  . Left sided abdominal pain 02/13/2012  . Fibromyalgia   . 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 Osteoporosis, unspecified 07/30/2012  . Depression and Anxiety - 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 07/16/2012    MEDICATIONS: Current Outpatient Prescriptions on File Prior to Visit  Medication Sig Dispense Refill  . CALCIUM PO Take by mouth.      . Cholecalciferol (VITAMIN D3) 2000 UNITS TABS Take 1 tablet by mouth daily.      Marland Kitchen gabapentin (NEURONTIN) 300 MG capsule Take 1 capsule (300 mg total) by mouth 3 (three) times daily.  90 capsule  11  . hydrochlorothiazide (MICROZIDE) 12.5 MG capsule Take 1 capsule (12.5 mg total) by mouth daily.  90 capsule  5  . Krill Oil 300 MG CAPS Take 1 capsule by mouth daily.      . magnesium 30 MG tablet Take 30 mg by mouth 2 (two) times daily.      . Multiple Vitamin (MULTIVITAMIN) tablet Take 1 tablet by mouth daily.      . zoledronic acid (RECLAST) 5 MG/100ML SOLN injection Inject 100 mLs (5 mg total) into the vein once.  100 mL  0   No current facility-administered medications on file prior to visit.    ALLERGIES: Allergies  Allergen Reactions  . Vicodin [Hydrocodone-Acetaminophen] Itching    FAMILY HISTORY: Family History  Problem Relation Age of Onset  . Hypertension Father   . Lung cancer Father   . Liver cancer Father   . Lung cancer Mother   . Hypertension Sister   . Osteoporosis Sister   . Hypertension Brother   . Arthritis Brother   . Diabetes Neg Hx   . Epilepsy Father     SOCIAL HISTORY: History   Social History  . Marital Status: Married    Spouse Name: N/A    Number of Children: 2  . 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 Smoking status: Never Smoker   . Smokeless tobacco: Never Used  . Alcohol Use: No  . Drug Use: No  . Sexual Activity: Not on file   Other Topics Concern  .  Not on file   Social History Narrative   Work or School: [employer]      Home Situation: lives with husband      Spiritual Beliefs: Baptist      Lifestyle: no regular exercise; diet is ok                REVIEW OF SYSTEMS: Constitutional: No fevers, chills, or sweats, no generalized fatigue, change in appetite Eyes: No visual changes, double vision, eye pain Ear, nose and throat: No hearing loss, ear pain, nasal congestion, sore throat Cardiovascular: No chest pain, palpitations Respiratory:  No shortness of breath at rest or with exertion, wheezes GastrointestinaI: No nausea, vomiting, diarrhea, abdominal pain, fecal incontinence Genitourinary:  No dysuria, urinary retention or frequency Musculoskeletal:  No neck pain, back pain Integumentary: No rash, pruritus, skin lesions Neurological: as above Psychiatric: No depression, insomnia, anxiety Endocrine: No palpitations, fatigue, diaphoresis, mood swings, change in appetite, change in weight, increased thirst Hematologic/Lymphatic:  No anemia, purpura, petechiae. Allergic/Immunologic: no itchy/runny eyes, nasal congestion, recent allergic reactions, rashes  PHYSICAL EXAM: Filed Vitals:   08/26/13 1457  BP: 122/66  Pulse: 68  Resp: 14   General: No acute distress Head:  Normocephalic/atraumatic, tenderness to palpation of left temple Neck: supple, no paraspinal tenderness, full range of motion Heart:  Regular rate and rhythm Lungs:  Clear to auscultation bilaterally Back: No paraspinal tenderness Neurological Exam: alert and oriented to person, place, and time. Attention span and concentration intact, recent and remote memory intact, fund of knowledge intact.  Speech fluent and not dysarthric, language intact.  CN II-XII intact. Fundoscopic exam unremarkable without vessel changes, exudates, hemorrhages or papilledema.  Bulk and tone normal, muscle strength 5/5 throughout.  Sensation to light touch, temperature  and vibration intact.  Deep tendon reflexes 2+ throughout, toes downgoing.  Finger to nose and heel to shin testing intact.  Gait normal, Romberg negative.  IMPRESSION: Burning tongue syndrome Left-sided headache.  New-onset.  Started today.  Similar headache two months ago.  It is not a severe headache.  Exam is non-focal.    PLAN: 1.  Offered Toradol injection but she has a long way to drive and concerned about drowsiness.  She does not wish to try a steroid taper.  Suggested that if the headache does not resolve by the end of the week, to call us on Friday and she can come in for a Toradol 60mg  injection (she must have somebody drive back).  If the headache becomes severe or she experiences other new symptoms, she should go to the ED. 2.  Check Sed Rate 3.  Continue gabapentin 600mg  three  times daily 4.  Follow up in 6 months or as needed.  Metta Clines, DO  CC: Colin Benton, DO

## 2013-08-27 ENCOUNTER — Telehealth: Payer: Self-pay | Admitting: *Deleted

## 2013-08-27 LAB — SEDIMENTATION RATE: Sed Rate: 1 mm/hr (ref 0–22)

## 2013-08-27 NOTE — Telephone Encounter (Signed)
Message copied by Claudie Revering on Wed Aug 27, 2013  8:10 AM ------      Message from: JAFFE, ADAM R      Created: Wed Aug 27, 2013  6:56 AM       Sed rate normal      ----- Message -----         From: [facility]         Sent: 08/27/2013   1:31 AM           To: Dudley Major, DO                   ------

## 2013-08-27 NOTE — Telephone Encounter (Signed)
Patient is aware of normal lab results  

## 2013-09-22 ENCOUNTER — Ambulatory Visit (INDEPENDENT_AMBULATORY_CARE_PROVIDER_SITE_OTHER): Payer: BC Managed Care – PPO | Admitting: Family Medicine

## 2013-09-22 ENCOUNTER — Encounter: Payer: Self-pay | Admitting: Family Medicine

## 2013-09-22 VITALS — BP 126/80 | HR 69 | Temp 97.6°F | Ht 62.0 in | Wt 140.0 lb

## 2013-09-22 DIAGNOSIS — K219 Gastro-esophageal reflux disease without esophagitis: Secondary | ICD-10-CM

## 2013-09-22 DIAGNOSIS — I10 Essential (primary) hypertension: Secondary | ICD-10-CM

## 2013-09-22 DIAGNOSIS — M542 Cervicalgia: Secondary | ICD-10-CM

## 2013-09-22 DIAGNOSIS — F3289 Other specified depressive episodes: Secondary | ICD-10-CM

## 2013-09-22 DIAGNOSIS — F32A Depression, unspecified: Secondary | ICD-10-CM

## 2013-09-22 DIAGNOSIS — M792 Neuralgia and neuritis, unspecified: Secondary | ICD-10-CM

## 2013-09-22 DIAGNOSIS — IMO0002 Reserved for concepts with insufficient information to code with codable children: Secondary | ICD-10-CM

## 2013-09-22 DIAGNOSIS — F329 Major depressive disorder, single episode, unspecified: Secondary | ICD-10-CM

## 2013-09-22 DIAGNOSIS — M810 Age-related osteoporosis without current pathological fracture: Secondary | ICD-10-CM

## 2013-09-22 NOTE — Progress Notes (Signed)
No chief complaint on file.   HPI:  Follow up:  1) HTN:  -meds: hctz 12.5 mg daily  -reports: doing well  -denies: CP, SOB, swelling   2)Anxiety and Depression:  -used to see Dr. Toy Care for this  -reports: no longer seeing psychiatrist as is doing great and is not on medications -denies: depression, anxiety   3)Osteoporosis:  - seeing Dr. Dwyane Dee for this  - recent notes reviewed, did not tolerate or like various oral medication so doing reclast -appreciate care   4)Burning tongue syndrome/Headaches:  -see Dr. Tomi Likens in Neurology for this -recent notes/labs reviewed -meds: gabapentin -denies: weakness, vomiting, numbness, visual disturbance or headache today  5)Neck Pain: -started about 6 weeks ago after painting and has been doing a lot of home improvement -burning and throbbing pain in upper back/lowe neck, pain is 5/78 -worse with certain movements, raising shoulders/abd shoulders -better at rest, aleve doesn't help -denies: weakness, numbness, radiation, trauma, fevers, malaise, weight loss  ROS: See pertinent positives and negatives per HPI.  Past Medical History  Diagnosis Date  . Candidiasis of mouth   . Anxiety   . Benign hypertension   . GERD (gastroesophageal reflux disease)   . Hematochezia 02/13/2012  . Left sided abdominal pain 02/13/2012  . Fibromyalgia   . 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 Osteoporosis, unspecified 07/30/2012  . Depression and Anxiety - 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 07/16/2012    Past Surgical History  Procedure Laterality Date  . Abdominal hysterectomy    . Foot surgery Bilateral   . Tonsillectomy    . Breast lumpectomy Right     Family History  Problem Relation Age of Onset  . Hypertension Father   . Lung cancer Father   . Liver cancer Father   . Lung cancer Mother   . Hypertension Sister   . Osteoporosis Sister   . Hypertension Brother   . Arthritis Brother   . Diabetes Neg Hx   . Epilepsy Father     History    Social History  . Marital Status: Married    Spouse Name: N/A    Number of Children: 2  . 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 Smoking status: Never Smoker   . Smokeless tobacco: Never Used  . Alcohol Use: No  . Drug Use: No  . Sexual Activity: None   Other Topics Concern  . None   Social History Narrative   Work or School: [employer]      Home Situation: lives with husband      Spiritual Beliefs: Baptist      Lifestyle: no regular exercise; diet is ok                Current outpatient prescriptions:CALCIUM PO, Take by mouth., Disp: , Rfl: ;  Cholecalciferol (VITAMIN D3) 2000 UNITS TABS, Take 1 tablet by mouth daily., Disp: , Rfl: ;  gabapentin (NEURONTIN) 300 MG capsule, Take 1 capsule (300 mg total) by mouth 3 (three) times daily., Disp: 90 capsule, Rfl: 11;  hydrochlorothiazide (MICROZIDE) 12.5 MG capsule, Take 1 capsule (12.5 mg total) by mouth daily., Disp: 90 capsule, Rfl: 5 Krill Oil 300 MG CAPS, Take 1 capsule by mouth daily., Disp: , Rfl: ;  magnesium 30 MG tablet, Take 30 mg by mouth 2 (two) times daily., Disp: , Rfl: ;  Multiple Vitamin (MULTIVITAMIN) tablet, Take 1 tablet by mouth daily., Disp: ,  Rfl: ;  zoledronic acid (RECLAST) 5 MG/100ML SOLN injection, Inject 100 mLs (5 mg total) into the vein once., Disp: 100 mL, Rfl: 0  EXAM:  Filed Vitals:   09/22/13 1516  BP: 126/80  Pulse: 69  Temp: 97.6 F (36.4 C)    Body mass index is 25.6 kg/(m^2).  GENERAL: vitals reviewed and listed above, alert, oriented, appears well hydrated and in no acute distress  HEENT: atraumatic, conjunttiva clear, no obvious abnormalities on inspection of external nose and ears  NECK: no obvious masses on inspection  LUNGS: clear to auscultation bilaterally, no wheezes, rales or rhonchi, good air movement  CV: HRRR, no peripheral edema  MS: moves all extremities without noticeable abnormality -normal  inspection of head, neck upper back EXCEPT for head forward posture with likely reversal on normal cervical curve to some extent and increased thoracic kyphosis -normal ROM head and neck, normal strength and sensation in upper ext bilat -TTP soft tissues upper thoracic and lower cervical paraspinal soft tissues and interspinus betweet C6-C7, prominence of C7 seems normal given postural changes  PSYCH: pleasant and cooperative, no obvious depression or anxiety  ASSESSMENT AND PLAN:  Discussed the following assessment and plan:  Neck pain -we discussed possible serious and likely etiologies, workup and treatment, treatment risks and return precautions -after this discussion, Jeslynn opted for HEP, conservative tx and close follow up with plain films if persists -follow up advised in 1 month -of course, we advised Shanyiah  to return or notify a doctor immediately if symptoms worsen or persist or new concerns arise.  Depression and Anxiety - 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 in the past  Essential hypertension, benign  Gastroesophageal reflux disease without esophagitis  Neuralgia  Osteoporosis, unspecified  -chronic disease stable -cont current medications -influenza vaccine advised - declined, will get at work -CPE in december -Patient advised to return or notify a doctor immediately if symptoms worsen or persist or new concerns arise.  Patient Instructions  -do the exercises provided at least 4 days per week  -pay attention to posture  -tylenol 500-1000mg  twice daily IF needed for pain along with topical sports creams containing capsaicin or menthol  -follow up in 1 month     KIM, HANNAH R.

## 2013-09-22 NOTE — Progress Notes (Signed)
Pre visit review using our clinic review tool, if applicable. No additional management support is needed unless otherwise documented below in the visit note. 

## 2013-09-22 NOTE — Patient Instructions (Signed)
-  do the exercises provided at least 4 days per week  -pay attention to posture  -tylenol 500-1000mg  twice daily IF needed for pain along with topical sports creams containing capsaicin or menthol  -follow up in 1 month

## 2013-10-17 ENCOUNTER — Ambulatory Visit (INDEPENDENT_AMBULATORY_CARE_PROVIDER_SITE_OTHER)
Admission: RE | Admit: 2013-10-17 | Discharge: 2013-10-17 | Disposition: A | Discharge status: Home / Self Care | Payer: BC Managed Care – PPO | Source: Ambulatory Visit | Attending: Family Medicine | Admitting: Family Medicine

## 2013-10-17 ENCOUNTER — Ambulatory Visit (INDEPENDENT_AMBULATORY_CARE_PROVIDER_SITE_OTHER): Payer: BC Managed Care – PPO | Admitting: Family Medicine

## 2013-10-17 ENCOUNTER — Encounter: Payer: Self-pay | Admitting: Family Medicine

## 2013-10-17 VITALS — BP 118/78 | HR 88 | Temp 98.3°F | Ht 62.0 in | Wt 139.0 lb

## 2013-10-17 DIAGNOSIS — M79602 Pain in left arm: Secondary | ICD-10-CM

## 2013-10-17 DIAGNOSIS — M542 Cervicalgia: Secondary | ICD-10-CM

## 2013-10-17 DIAGNOSIS — M549 Dorsalgia, unspecified: Secondary | ICD-10-CM

## 2013-10-17 DIAGNOSIS — M546 Pain in thoracic spine: Secondary | ICD-10-CM

## 2013-10-17 NOTE — Progress Notes (Signed)
Pre visit review using our clinic review tool, if applicable. No additional management support is needed unless otherwise documented below in the visit note. 

## 2013-10-17 NOTE — Patient Instructions (Signed)
-  go get the xrays today  -We placed a referral for you as discussed to the physical therapist  -I would advise an MRI of your back/evaluation by a specialist if this pain persists  -follow up in 4 weeks

## 2013-10-17 NOTE — Progress Notes (Signed)
No chief complaint on file.   HPI:  Follow up:   Neck Pain:  -started about started in 08/2013 after painting and home improvement work -has been cleaning and vaccuming -pain in neck resolved, now has pain in the upper back between the shoulder blades and in the L upper posterior arm -worse with certain movements, raising shoulders/abd shoulders  -better at rest, better with heat -has been doing th HEP -denies: weakness, numbness, trauma, fevers, malaise, weight loss, CP, sob -tx with HEP, conservative care at appt 09/22/13  ROS: See pertinent positives and negatives per HPI.  Past Medical History  Diagnosis Date  . Candidiasis of mouth   . Anxiety   . Benign hypertension   . GERD (gastroesophageal reflux disease)   . Hematochezia 02/13/2012  . Left sided abdominal pain 02/13/2012  . Fibromyalgia   . 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 Osteoporosis, unspecified 07/30/2012  . Depression and Anxiety - 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 07/16/2012    Past Surgical History  Procedure Laterality Date  . Abdominal hysterectomy    . Foot surgery Bilateral   . Tonsillectomy    . Breast lumpectomy Right     Family History  Problem Relation Age of Onset  . Hypertension Father   . Lung cancer Father   . Liver cancer Father   . Lung cancer Mother   . Hypertension Sister   . Osteoporosis Sister   . Hypertension Brother   . Arthritis Brother   . Diabetes Neg Hx   . Epilepsy Father     History   Social History  . Marital Status: Married    Spouse Name: N/A    Number of Children: 2  . 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 Smoking status: Never Smoker   . Smokeless tobacco: Never Used  . Alcohol Use: No  . Drug Use: No  . Sexual Activity: None   Other Topics Concern  . None   Social History Narrative   Work or School: [employer]      Home Situation: lives with husband      Spiritual Beliefs: Baptist       Lifestyle: no regular exercise; diet is ok                Current outpatient prescriptions:CALCIUM PO, Take by mouth., Disp: , Rfl: ;  Cholecalciferol (VITAMIN D3) 2000 UNITS TABS, Take 1 tablet by mouth daily., Disp: , Rfl: ;  gabapentin (NEURONTIN) 300 MG capsule, Take 1 capsule (300 mg total) by mouth 3 (three) times daily., Disp: 90 capsule, Rfl: 11;  hydrochlorothiazide (MICROZIDE) 12.5 MG capsule, Take 1 capsule (12.5 mg total) by mouth daily., Disp: 90 capsule, Rfl: 5 Krill Oil 300 MG CAPS, Take 1 capsule by mouth daily., Disp: , Rfl: ;  magnesium 30 MG tablet, Take 30 mg by mouth 2 (two) times daily., Disp: , Rfl: ;  Multiple Vitamin (MULTIVITAMIN) tablet, Take 1 tablet by mouth daily., Disp: , Rfl: ;  zoledronic acid (RECLAST) 5 MG/100ML SOLN injection, Inject 100 mLs (5 mg total) into the vein once., Disp: 100 mL, Rfl: 0  EXAM:  Filed Vitals:   10/17/13 1303  BP: 118/78  Pulse: 88  Temp: 98.3 F (36.8 C)    Body mass index is 25.42 kg/(m^2).  GENERAL: vitals reviewed and listed above, alert, oriented, appears well hydrated and in no acute distress  HEENT: atraumatic,  conjunttiva clear, no obvious abnormalities on inspection of external nose and ears  NECK: no obvious masses on inspection  MS/NEURO: moves all extremities without noticeable abnormality, normal gait, normal inspection of head and neck other then head and shoulders forward posture, no bony TTP, neg spurling, TTP in rhomboids bilat, normal strength sensation, pulses in UEs bilat, no other TTP  PSYCH: pleasant and cooperative, no obvious depression or anxiety  ASSESSMENT AND PLAN:  Discussed the following assessment and plan:  Neck pain - Plan: Ambulatory referral to Physical Therapy, DG Thoracic Spine W/Swimmers  Upper back pain - Plan: Ambulatory referral to Physical Therapy, DG Cervical Spine Complete  Left arm pain - Plan: DG Thoracic Spine W/Swimmers  -discussed potential etiolgies - ?  Muscular strain, radicular pain -advised given persistent symptom MRI or PMR eval -she was reluctant so opted for plain films, formal PT, close follow up -Patient advised to return or notify a doctor immediately if symptoms worsen or persist or new concerns arise.  Patient Instructions  -go get the xrays today  -We placed a referral for you as discussed to the physical therapist  -I would advise an MRI of your back/evaluation by a specialist if this pain persists  -follow up in 4 weeks      KIM, HANNAH R.

## 2013-10-20 ENCOUNTER — Other Ambulatory Visit: Payer: Self-pay

## 2013-10-20 DIAGNOSIS — Z1239 Encounter for other screening for malignant neoplasm of breast: Secondary | ICD-10-CM

## 2013-10-21 ENCOUNTER — Ambulatory Visit: Payer: BC Managed Care – PPO | Admitting: Family Medicine

## 2013-10-27 ENCOUNTER — Ambulatory Visit: Payer: BC Managed Care – PPO | Attending: Family Medicine

## 2013-10-27 DIAGNOSIS — R293 Abnormal posture: Secondary | ICD-10-CM | POA: Diagnosis not present

## 2013-10-27 DIAGNOSIS — M797 Fibromyalgia: Secondary | ICD-10-CM | POA: Insufficient documentation

## 2013-10-27 DIAGNOSIS — M2560 Stiffness of unspecified joint, not elsewhere classified: Secondary | ICD-10-CM | POA: Diagnosis not present

## 2013-10-27 DIAGNOSIS — M546 Pain in thoracic spine: Secondary | ICD-10-CM | POA: Insufficient documentation

## 2013-10-27 DIAGNOSIS — Z5189 Encounter for other specified aftercare: Secondary | ICD-10-CM | POA: Insufficient documentation

## 2013-10-27 DIAGNOSIS — M542 Cervicalgia: Secondary | ICD-10-CM | POA: Insufficient documentation

## 2013-10-30 ENCOUNTER — Ambulatory Visit: Payer: BC Managed Care – PPO | Admitting: Rehabilitation

## 2013-10-30 DIAGNOSIS — Z5189 Encounter for other specified aftercare: Secondary | ICD-10-CM | POA: Diagnosis not present

## 2013-11-03 ENCOUNTER — Encounter: Payer: BC Managed Care – PPO | Admitting: Rehabilitation

## 2013-11-05 ENCOUNTER — Encounter: Payer: BC Managed Care – PPO | Admitting: Physical Therapy

## 2013-11-11 ENCOUNTER — Ambulatory Visit: Payer: BC Managed Care – PPO | Attending: Family Medicine

## 2013-11-11 DIAGNOSIS — Z5189 Encounter for other specified aftercare: Secondary | ICD-10-CM | POA: Insufficient documentation

## 2013-11-11 DIAGNOSIS — R293 Abnormal posture: Secondary | ICD-10-CM | POA: Diagnosis not present

## 2013-11-11 DIAGNOSIS — M2560 Stiffness of unspecified joint, not elsewhere classified: Secondary | ICD-10-CM | POA: Diagnosis not present

## 2013-11-11 DIAGNOSIS — M436 Torticollis: Secondary | ICD-10-CM

## 2013-11-11 DIAGNOSIS — M542 Cervicalgia: Secondary | ICD-10-CM | POA: Insufficient documentation

## 2013-11-11 DIAGNOSIS — M797 Fibromyalgia: Secondary | ICD-10-CM | POA: Diagnosis not present

## 2013-11-11 DIAGNOSIS — M546 Pain in thoracic spine: Secondary | ICD-10-CM | POA: Diagnosis not present

## 2013-11-11 NOTE — Therapy (Signed)
Physical Therapy Treatment  Patient Details  Name: Dawn Reyes MRN: [id number] Date of Birth: 1950-09-05  Encounter Date: 11/11/2013      PT End of Session - 11/11/13 1546    Visit Number 3   Number of Visits 16   PT Start Time 1502   PT Stop Time 4650   PT Time Calculation (min) 53 min   Activity Tolerance Patient tolerated treatment well      Past Medical History  Diagnosis Date  . Candidiasis of mouth   . Anxiety   . Benign hypertension   . GERD (gastroesophageal reflux disease)   . Hematochezia 02/13/2012  . Left sided abdominal pain 02/13/2012  . Fibromyalgia   . 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 Osteoporosis, unspecified 07/30/2012  . Depression and Anxiety - 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 07/16/2012    Past Surgical History  Procedure Laterality Date  . Abdominal hysterectomy    . Foot surgery Bilateral   . Tonsillectomy    . Breast lumpectomy Right     There were no vitals taken for this visit.  Visit Diagnosis:  Cervicalgia - Plan: PT PLAN OF CARE CERT/RE-CERT  Poor posture - Plan: PT PLAN OF CARE CERT/RE-CERT  Stiffness of neck - Plan: PT PLAN OF CARE CERT/RE-CERT          Adult PT Treatment/Exercise - 11/11/13 0700    Neck Exercises: Stretches   Upper Trapezius Stretch 2 reps;20 seconds   Levator Stretch 2 reps;20 seconds   Lower Cervical/Upper Thoracic Stretch 2 reps;20 seconds   Neck Exercises: Supine   Neck Retraction 10 reps;5 secs    Manual treatment with soft tissue work to neck and upper thoracic spine, manual traction. Grade 2 PA mobs.      Education - 11/11/13 1545    Education provided Yes   Education Details Patient   Methods Demonstration;Explanation;Handout   Comprehension Verbalized understanding;Returned demonstration          PT Short Term Goals - 11/11/13 1555    PT SHORT TERM GOAL #1   Title independent with initial HEP   Time 4   Period Weeks   Status Achieved   PT SHORT TERM GOAL #2   Title report pain  decreased 25% in neck   Time 4   Period Weeks   Status On-going   PT SHORT TERM GOAL #3   Title report pain decreased 25% in upper back   Time 4   Period Weeks   Status On-going   PT SHORT TERM GOAL #4   Title active cervical rotation and sidebending equal to RT   Time 4   Period Weeks   Status On-going          PT Long Term Goals - 11/11/13 1556    PT LONG TERM GOAL #1   Title demo understanding of good posture and mechanics   Time 8   Period Weeks   Status On-going   PT LONG TERM GOAL #2   Title Independent advanced HEP   Time 8   Period Weeks   Status On-going   PT LONG TERM GOAL #3   Title Pt. Report pain decreased 75% in neck   Time 8   Period Weeks   Status On-going   PT LONG TERM GOAL #4   Title Report pain decrased 75% in upper back   Time 8   Period Weeks   Status On-going   PT LONG TERM GOAL #5   Title  Pr report performing normal home tasks with minimal pain   Time 8   Period Weeks   Status On-going   PT LONG TERM GOAL #6   Title sleep 8 hours without wake due to pain   Time 8   Period Weeks   Status On-going   PT LONG TERM GOAL #7   Title Pick up  grandchild without pain   Time 8   Period Weeks   Status On-going          Plan - 11/11/13 1546    Clinical Impression Statement Ms Buske has not improved or attained any goals except being able to demo HEP correctly   Pt will benefit from skilled therapeutic intervention in order to improve on the following deficits Pain   Rehab Potential Good   PT Frequency Min 2X/week   PT Duration 6 weeks   PT Treatment/Interventions Therapeutic exercise;Manual techniques;Modalities   PT Plan Add ultrasound, continue manual, may add traction         Problem List Patient Active Problem List   Diagnosis Date Noted  . 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 Osteoporosis, unspecified 07/30/2012  . Depression and Anxiety - 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 in the past 07/16/2012  . Essential  hypertension, benign 07/16/2012  . GERD (gastroesophageal reflux disease) 07/16/2012  . Neuralgia 07/16/2012                                            Darrel Hoover 11/11/2013, 4:00 PM

## 2013-11-11 NOTE — Patient Instructions (Signed)
Pt was asked to continue the HEP but to increase stretch to 20 seconds but ease on pull.

## 2013-11-13 ENCOUNTER — Ambulatory Visit: Payer: BC Managed Care – PPO | Admitting: Physical Therapy

## 2013-11-13 DIAGNOSIS — Z5189 Encounter for other specified aftercare: Secondary | ICD-10-CM | POA: Diagnosis not present

## 2013-11-13 DIAGNOSIS — M436 Torticollis: Secondary | ICD-10-CM

## 2013-11-13 DIAGNOSIS — R293 Abnormal posture: Secondary | ICD-10-CM

## 2013-11-13 DIAGNOSIS — M542 Cervicalgia: Secondary | ICD-10-CM

## 2013-11-13 NOTE — Therapy (Signed)
Physical Therapy Treatment  Patient Details  Name: Dawn Reyes MRN: [id number] Date of Birth: 11/10/1950  Encounter Date: 11/13/2013      PT End of Session - 11/13/13 1521    Visit Number 4   Number of Visits 16   Date for PT Re-Evaluation 12/24/13   PT Start Time 8315   PT Stop Time 1523   PT Time Calculation (min) 63 min   Activity Tolerance Patient tolerated treatment well      Past Medical History  Diagnosis Date  . Candidiasis of mouth   . Anxiety   . Benign hypertension   . GERD (gastroesophageal reflux disease)   . Hematochezia 02/13/2012  . Left sided abdominal pain 02/13/2012  . Fibromyalgia   . 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 Osteoporosis, unspecified 07/30/2012  . Depression and Anxiety - 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 07/16/2012    Past Surgical History  Procedure Laterality Date  . Abdominal hysterectomy    . Foot surgery Bilateral   . Tonsillectomy    . Breast lumpectomy Right     There were no vitals taken for this visit.  Visit Diagnosis:  Cervicalgia  Poor posture  Stiffness of neck          [facility] Adult PT Treatment/Exercise - 11/13/13 1444    Posture/Postural Control   Posture/Postural Control --  Posture education for ADL   Modalities   Modalities Moist Heat   Moist Heat Therapy   Number Minutes Moist Heat 15 Minutes   Moist Heat Location --  neck and back   Manual Therapy   Manual Therapy Myofascial release   Manual Therapy   Myofascial Release --  and neuromuscular trigger point release          Education - 11/13/13 1518    Education provided Yes   Education Details Patient   Methods Explanation;Demonstration;Verbal cues;Handout   Comprehension Verbalized understanding;Returned demonstration              Plan - 11/13/13 1527    Clinical Impression Statement Patient able to demonstrate good lifting thechniques, simulating ADL's.  Patient could not relax during soft tissue work, very tense today.   PT  Plan answer any body mechanics questions, see PT plan from 11/11/13        Problem List Patient Active Problem List   Diagnosis Date Noted  . 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 Osteoporosis, unspecified 07/30/2012  . Depression and Anxiety - 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 in the past 07/16/2012  . Essential hypertension, benign 07/16/2012  . GERD (gastroesophageal reflux disease) 07/16/2012  . Neuralgia 07/16/2012                                            HARRIS,KAREN 11/13/2013, 3:32 PM

## 2013-11-13 NOTE — Patient Instructions (Addendum)
Patient given posture and body mechanics handout.

## 2013-11-14 ENCOUNTER — Ambulatory Visit
Admission: RE | Admit: 2013-11-14 | Discharge: 2013-11-14 | Disposition: A | Discharge status: Home / Self Care | Payer: BC Managed Care – PPO | Source: Ambulatory Visit

## 2013-11-14 ENCOUNTER — Other Ambulatory Visit: Payer: Self-pay

## 2013-11-14 DIAGNOSIS — Z1231 Encounter for screening mammogram for malignant neoplasm of breast: Secondary | ICD-10-CM

## 2013-11-21 ENCOUNTER — Ambulatory Visit: Payer: BC Managed Care – PPO | Admitting: Physical Therapy

## 2013-11-21 ENCOUNTER — Encounter: Payer: Self-pay | Admitting: Physical Therapy

## 2013-11-21 ENCOUNTER — Ambulatory Visit (INDEPENDENT_AMBULATORY_CARE_PROVIDER_SITE_OTHER): Payer: BC Managed Care – PPO | Admitting: Family Medicine

## 2013-11-21 ENCOUNTER — Encounter: Payer: Self-pay | Admitting: Family Medicine

## 2013-11-21 VITALS — BP 150/90 | HR 64 | Temp 98.4°F | Ht 62.0 in | Wt 142.0 lb

## 2013-11-21 DIAGNOSIS — M542 Cervicalgia: Secondary | ICD-10-CM

## 2013-11-21 DIAGNOSIS — R293 Abnormal posture: Secondary | ICD-10-CM

## 2013-11-21 DIAGNOSIS — Z5189 Encounter for other specified aftercare: Secondary | ICD-10-CM | POA: Diagnosis not present

## 2013-11-21 DIAGNOSIS — I10 Essential (primary) hypertension: Secondary | ICD-10-CM

## 2013-11-21 DIAGNOSIS — M436 Torticollis: Secondary | ICD-10-CM

## 2013-11-21 MED ORDER — HYDROCHLOROTHIAZIDE 25 MG PO TABS: 25.0000 mg | ORAL_TABLET | Freq: Every day | ORAL | Status: DC

## 2013-11-21 NOTE — Patient Instructions (Signed)
  Flexibility: Upper Trapezius Stretch   Gently grasp right side of head while reaching behind back with other hand. Tilt head away until a gentle stretch is felt. Hold ____ seconds. Repeat __2-3__ times per set. Do ____1 sets per session. Do __2__ sessions per day.  http://orth.exer.us/340   Levator Stretch   Grasp seat or sit on hand on side to be stretched. Turn head toward other side and look down. Use hand on head to gently stretch neck in that position. Hold ____ seconds. Repeat on other side. Repeat __2-3__ times. Do ___2_ sessions per day.  http://[email or web address].exer.us/30   Scapular Retraction (Standing)   With arms at sides, pinch shoulder blades together. Repeat __10__ times per set. Do __1_ sets per session. Do _2___ sessions per day.  http://orth.exer.us/944   Flexibility: Neck Retraction   Pull head straight back, keeping eyes and jaw level. Repeat __5__ times per set. Do __1__ sets per session. Do __2__ sessions per day.  http://orth.exer.us/344   Posture - Sitting   Sit upright, head facing forward. Try using a roll to support lower back. Keep shoulders relaxed, and avoid rounded back. Keep hips level with knees. Avoid crossing legs for long periods.   Flexibility: Corner Stretch   Standing in corner with hands just above shoulder level and feet ___12_ inches from corner, lean forward until a comfortable stretch is felt across chest. Hold __20-30__ seconds. Repeat ____2-3 times per set. Do __1_ sets per session. Do ___2_ sessions per day.  http://orth.exer.us/342   Copyright  VHI. All rights reserved.  Head Press With Funk chin SLIGHTLY toward chest, keep mouth closed. Feel weight on back of head. Increase weight by pressing head down. Hold __5_ seconds. Relax. Repeat _5-10__ times. Surface: floor , use a rolled up towel if you like.   Copyright  VHI. All rights reserved.

## 2013-11-21 NOTE — Therapy (Signed)
Physical Therapy Treatment  Patient Details  Name: Dawn Reyes MRN: [id number] Date of Birth: 1950/05/21  Encounter Date: 11/21/2013      PT End of Session - 11/21/13 0940    Visit Number 5   Number of Visits 16   PT Start Time 0806   PT Stop Time 0900   PT Time Calculation (min) 54 min   Activity Tolerance Patient tolerated treatment well      Past Medical History  Diagnosis Date  . Candidiasis of mouth   . Anxiety   . Benign hypertension   . GERD (gastroesophageal reflux disease)   . Hematochezia 02/13/2012  . Left sided abdominal pain 02/13/2012  . Fibromyalgia   . 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 Osteoporosis, unspecified 07/30/2012  . Depression and Anxiety - 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 07/16/2012    Past Surgical History  Procedure Laterality Date  . Abdominal hysterectomy    . Foot surgery Bilateral   . Tonsillectomy    . Breast lumpectomy Right     There were no vitals taken for this visit.  Visit Diagnosis:  Cervicalgia  Poor posture  Stiffness of neck      Subjective Assessment - 11/21/13 0810    Symptoms Pain in neck, down into Lt. UE today (began yesterday) and between shoulder blades.  Not sure if PT is helping much.  Sees Dr. Maudie Mercury today.     Currently in Pain? Yes   Pain Score 5    Pain Location Neck   Pain Orientation Right;Left   Pain Descriptors / Indicators Aching   Pain Type Chronic pain   Pain Radiating Towards Lt. UE   Pain Onset More than a month ago   Pain Frequency Constant   Aggravating Factors  sitting, turning head   Pain Relieving Factors heat, massage, takes Aleve   Multiple Pain Sites No          [facility] PT Assessment - 11/21/13 0001    AROM   Cervical Flexion WNL  WNL   Cervical Extension WNL   Cervical - Right Side Bend 24   Cervical - Left Side Bend 25   Cervical - Right Rotation 60   Cervical - Left Rotation 45   Strength   Right Shoulder Flexion 5/5   Right Shoulder ABduction 5/5   Left Shoulder  Flexion --  4+/5   Left Shoulder ABduction --  4+/5          [facility] Adult PT Treatment/Exercise - 11/21/13 0826    Neck Exercises: Machines for Strengthening   UBE (Upper Arm Bike) --  level 1 , 5 min with corrected posture   Neck Exercises: Seated   Neck Retraction 10 reps;5 secs  x5 with rotation into yellow ball   Lateral Flexion Both;Other reps (comment)  2 reps   Postural Training --  cervical stabilization ex in supine   Other Seated Exercise --  Levator scap stretching 2 reps, 30 sec each side   Neck Exercises: Supine   Theraband Level (UE D2) Level 2 (Red)  x10 each arm   Other Supine Exercise --  cervical stab ex in supine with red T band and ball, various   Other Supine Exercise --  Horiz ABD with red band x10   Moist Heat Therapy   Number Minutes Moist Heat 12 Minutes   Moist Heat Location --  cervical spine in supine   Manual Therapy   Myofascial Release --  Soft tissue work to  cervical parapsinals, PROM in [place]          PT Education - 11/21/13 0939    Education provided Yes   Education Details HEP review   Person(s) Educated Patient   Methods Explanation;Demonstration;Verbal cues;Handout   Comprehension Verbalized understanding;Returned demonstration              Plan - 11/21/13 0940    Clinical Impression Statement Patient reports very min improvement overall.  No further goals met.  She demonstrates good posture and reports diligence with HEP.  She reponds to STW, modalities but only temporarily. I advised her to continue a few more visits before deciding  to stop PT.     Pt will benefit from skilled therapeutic intervention in order to improve on the following deficits Decreased range of motion;Impaired flexibility;Decreased strength   Rehab Potential Excellent   PT Frequency 2x / week   PT Duration 8 weeks   PT Treatment/Interventions Moist Heat;Patient/family education;Therapeutic exercise;Passive range of motion;Manual  techniques;Dry needling;Electrical Stimulation;Neuromuscular re-education;Ultrasound   PT Plan Cont with PT, spend more time on manual and consider heat prior as she had diffiiculty relaxing.         Problem List Patient Active Problem List   Diagnosis Date Noted  . 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 Osteoporosis, unspecified 07/30/2012  . Depression and Anxiety - 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 in the past 07/16/2012  . Essential hypertension, benign 07/16/2012  . GERD (gastroesophageal reflux disease) 07/16/2012  . Neuralgia 07/16/2012                                              Raeford Razor, PT 11/21/2013, 9:54 AM

## 2013-11-21 NOTE — Progress Notes (Signed)
Pre visit review using our clinic review tool, if applicable. No additional management support is needed unless otherwise documented below in the visit note. 

## 2013-11-21 NOTE — Patient Instructions (Addendum)
-  INCREASE THE hydrochlorthiazide to 25 mg daily  -follow up in 1-2 months for physical  -We placed a referral for you as discussed for the neck pain. It usually takes about 1-2 weeks to process and schedule this referral. If you have not heard from [place] regarding this appointment in 2 weeks please contact our office.
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
## 2013-11-25 ENCOUNTER — Ambulatory Visit: Payer: BC Managed Care – PPO

## 2013-11-25 DIAGNOSIS — M436 Torticollis: Secondary | ICD-10-CM

## 2013-11-25 DIAGNOSIS — M542 Cervicalgia: Secondary | ICD-10-CM

## 2013-11-25 DIAGNOSIS — Z5189 Encounter for other specified aftercare: Secondary | ICD-10-CM | POA: Diagnosis not present

## 2013-11-25 DIAGNOSIS — R293 Abnormal posture: Secondary | ICD-10-CM

## 2013-11-25 NOTE — Therapy (Signed)
Physical Therapy Treatment  Patient Details  Name: Dawn Reyes MRN: [id number] Date of Birth: 12-18-1950  Encounter Date: 11/25/2013      PT End of Session - 11/25/13 0842    Visit Number 6   Number of Visits 16   Date for PT Re-Evaluation 12/24/13   PT Start Time 0803   PT Stop Time 0855   PT Time Calculation (min) 52 min   Activity Tolerance Patient tolerated treatment well  She reported headache almost completely gone   Behavior During Therapy [facility] for tasks assessed/performed      Past Medical History  Diagnosis Date  . Candidiasis of mouth   . Anxiety   . Benign hypertension   . GERD (gastroesophageal reflux disease)   . Hematochezia 02/13/2012  . Left sided abdominal pain 02/13/2012  . Fibromyalgia   . 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 Osteoporosis, unspecified 07/30/2012  . Depression and Anxiety - 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 07/16/2012    Past Surgical History  Procedure Laterality Date  . Abdominal hysterectomy    . Foot surgery Bilateral   . Tonsillectomy    . Breast lumpectomy Right     There were no vitals taken for this visit.  Visit Diagnosis:  Cervicalgia  Poor posture  Stiffness of neck      Subjective Assessment - 11/25/13 0806    Symptoms She has ahd some good days. Headache and Between scapula and LT shoulder Butis better   Currently in Pain? Yes   Pain Score 3    Pain Location Thoracic  neck and LT shoulder with HA   Pain Orientation Posterior;Left;Right   Pain Descriptors / Indicators Aching   Pain Type Chronic pain   Pain Onset More than a month ago   Pain Frequency Constant   Aggravating Factors  sitting and turning head   Pain Relieving Factors heat massage ALeve   Effect of Pain on Daily Activities vacumm, mop, lifting grandchild   Multiple Pain Sites No            [facility] Adult PT Treatment/Exercise - 11/25/13 0837    Modalities   Modalities --   Ultrasound   Ultrasound Location neck and upper thoracic   Ultrasound Parameters 1.5 Wcm2 1MHZ, 100%   Ultrasound Goals Pain   Manual Therapy   Manual Therapy Joint mobilization;Myofascial release;Massage   Joint Mobilization PA glides to upper cervical fot HA and mid thoracic ato mid cervical central Gr 2-4   Massage Soft tissue work to neclk and traps and upper thoracic area   Manual Therapy Myofascial release  to upper cervical to ease headache    HMP to neck to end session      PT Education - 11/25/13 0842    Education provided No              Plan - 11/25/13 0843    Clinical Impression Statement Pt reports improvement and reports corner stretch helpful.    Pt will benefit from skilled therapeutic intervention in order to improve on the following deficits Decreased range of motion;Impaired flexibility;Decreased strength   Rehab Potential Excellent   PT Frequency 2x / week   PT Duration 6 weeks   PT Treatment/Interventions Moist Heat;Patient/family education;Therapeutic exercise;Passive range of motion;Manual techniques;Dry needling;Electrical Stimulation;Neuromuscular re-education;Ultrasound   PT Next Visit Plan Continue manual and review stab exercises   PT Home Exercise Plan posture /stabilization execies   Consulted and Agree with Plan of Care Patient   PT Plan  Cont with PT, spend more time on manual and consider heat prior as she had diffiiculty relaxing.         Problem List Patient Active Problem List   Diagnosis Date Noted  . 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 Osteoporosis, unspecified 07/30/2012  . Depression and Anxiety - 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 in the past 07/16/2012  . Essential hypertension, benign 07/16/2012  . GERD (gastroesophageal reflux disease) 07/16/2012  . Neuralgia 07/16/2012                                              Darrel Hoover PT 11/25/2013, 8:45 AM

## 2013-11-28 ENCOUNTER — Ambulatory Visit: Payer: BC Managed Care – PPO | Admitting: Physical Therapy

## 2013-11-28 ENCOUNTER — Telehealth: Payer: Self-pay | Admitting: *Deleted

## 2013-11-28 DIAGNOSIS — M436 Torticollis: Secondary | ICD-10-CM

## 2013-11-28 DIAGNOSIS — Z5189 Encounter for other specified aftercare: Secondary | ICD-10-CM | POA: Diagnosis not present

## 2013-11-28 DIAGNOSIS — R293 Abnormal posture: Secondary | ICD-10-CM

## 2013-11-28 DIAGNOSIS — M542 Cervicalgia: Secondary | ICD-10-CM

## 2013-11-28 NOTE — Patient Instructions (Signed)
Pt. Instructed on fibromyalgia sx and how her symptoms may be a flare up of fibro.

## 2013-11-28 NOTE — Telephone Encounter (Signed)
APPTS MADE AND PRINTED...TD 

## 2013-11-28 NOTE — Therapy (Signed)
Physical Therapy Treatment  Patient Details  Name: Dawn Reyes MRN: [id number] Date of Birth: [birth year]  Encounter Date: 11/28/2013      PT End of Session - 11/28/13 0849    Visit Number 7   Number of Visits 16   PT Start Time 0802   PT Stop Time 0865   PT Time Calculation (min) 45 min   Activity Tolerance Patient tolerated treatment well      Past Medical History  Diagnosis Date  . Candidiasis of mouth   . Anxiety   . Benign hypertension   . GERD (gastroesophageal reflux disease)   . Hematochezia 02/13/2012  . Left sided abdominal pain 02/13/2012  . Fibromyalgia   . 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 Osteoporosis, unspecified 07/30/2012  . Depression and Anxiety - 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 07/16/2012    Past Surgical History  Procedure Laterality Date  . Abdominal hysterectomy    . Foot surgery Bilateral   . Tonsillectomy    . Breast lumpectomy Right     There were no vitals taken for this visit.  Visit Diagnosis:  Cervicalgia  Poor posture  Stiffness of neck      Subjective Assessment - 11/28/13 0815    Symptoms Pain went up today, not much difference,  Treatment helps but doe not last.  Was referred to pain clinic    Pertinent History Has fibromyalgia   How long can you sit comfortably? 30 minutes   How long can you stand comfortably? As long  as needed   How long can you walk comfortably? As long as needed   Currently in Pain? Yes   Pain Score 5    Pain Location Neck   Pain Orientation Posterior;Right;Left   Pain Descriptors / Indicators Aching   Pain Type Chronic pain   Pain Onset More than a month ago   Multiple Pain Sites Yes   Pain Score 5   Pain Type Chronic pain   Pain Location Scapula   Pain Orientation Mid;Upper   Pain Descriptors / Indicators Aching   Pain Frequency Intermittent   Pain Onset On-going            [facility] Adult PT Treatment/Exercise - 11/28/13 0839    Shoulder Exercises: Supine   Protraction AAROM;Both   on foam roller for scap. mobilization   Horizontal ABduction AAROM;10 reps  on foam roller   Flexion AAROM;Both;10 reps  for scap. mobilization x10   Other Supine Exercises circles x10 each diirection on roller   Moist Heat Therapy   Number Minutes Moist Heat 10 Minutes   Moist Heat Location --  neck, prior to manual   Manual Therapy   Manual Therapy Massage;Myofascial release   Massage soft tissue to cervicals, upper trap and levator sca, suboccipital release.  Trigger point Lt. side under occiput.   Myofascial Release --  upper traps   Manual Therapy Myofascial release                Plan - 11/28/13 0853    Clinical Impression Statement Pt. seeing  only min improvement, admits to depression.  She may consider injections.  Her pain is reduced after manual and ex with foam roller.    PT Next Visit Plan try stab on foam roller again   PT Plan continue with MFR, manual and encourage movement/exercise, yoga/pilates?        Problem List Patient Active Problem List   Diagnosis Date Noted  . Burning tongue syndrome  02/06/2013  . Osteoporosis, unspecified 07/30/2012  . Depression and Anxiety - 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 in the past 07/16/2012  . Essential hypertension, benign 07/16/2012  . GERD (gastroesophageal reflux disease) 07/16/2012  . Neuralgia 07/16/2012        Raeford Razor, PT 11/28/2013 9:17 AM Phone: [phone] Fax: [phone]  11/28/2013, 9:16 AM

## 2013-12-01 ENCOUNTER — Ambulatory Visit: Payer: BC Managed Care – PPO | Admitting: Physical Therapy

## 2013-12-01 DIAGNOSIS — Z5189 Encounter for other specified aftercare: Secondary | ICD-10-CM | POA: Diagnosis not present

## 2013-12-01 DIAGNOSIS — M542 Cervicalgia: Secondary | ICD-10-CM

## 2013-12-01 DIAGNOSIS — M436 Torticollis: Secondary | ICD-10-CM

## 2013-12-02 NOTE — Therapy (Signed)
Physical Therapy Treatment  Patient Details  Name: Dawn Reyes MRN: [id number] Date of Birth: [birth year]  Encounter Date: 12/01/2013      PT End of Session - 12/01/13 1630    Visit Number 8   Number of Visits 16   Date for PT Re-Evaluation 12/24/13   PT Start Time 8242   PT Stop Time 1640   PT Time Calculation (min) 55 min   Activity Tolerance Patient tolerated treatment well      Past Medical History  Diagnosis Date  . Candidiasis of mouth   . Anxiety   . Benign hypertension   . GERD (gastroesophageal reflux disease)   . Hematochezia 02/13/2012  . Left sided abdominal pain 02/13/2012  . Fibromyalgia   . 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 Osteoporosis, unspecified 07/30/2012  . Depression and Anxiety - 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 07/16/2012    Past Surgical History  Procedure Laterality Date  . Abdominal hysterectomy    . Foot surgery Bilateral   . Tonsillectomy    . Breast lumpectomy Right     There were no vitals taken for this visit.  Visit Diagnosis:  Cervicalgia  Stiffness of neck      Subjective Assessment - 12/01/13 1550    Symptoms does not feel well. Could you take my Blood pressure?  (160/84 mmHg)  O2 93, HR changed from 63 to 70/ minute.  I got real painful later in the evening after the last session. sore, my whole body and neck.  The exrecises felt really good at the time.   Currently in Pain? Yes   Pain Score 5    Pain Location Neck   Pain Orientation Posterior;Left;Right   Pain Descriptors / Indicators Aching;Sore   Pain Onset More than a month ago   Pain Frequency Constant   Aggravating Factors  some exercises feel good then hurt later.   Pain Relieving Factors massage, heat   Effect of Pain on Daily Activities Limit some activities     Treatment today focused on pain control due to patient not feeling well and her blood pressure up. Manual, Neuromuscular trigger point release. To neck and upper back.  Tissue softened.  This was  followed by moist heat to neck and upper back. Patient tolerated treatment well, and reported pain decrease.           PT Short Term Goals - 12/01/13 1640    PT SHORT TERM GOAL #2   Title report pain decreased 25% in neck   Time 4   Period Weeks   Status On-going   PT SHORT TERM GOAL #3   Title report pain decreased 25% in upper back   Time 4   Period Weeks   Status On-going            Plan - 12/01/13 1640    Clinical Impression Statement Patient not feeling well.  She declined foam roller exercises due to making her sore later.  Focus today on pain relief.  No new goals met.   PT Next Visit Plan Work on home exercise program.   PT Plan exercises for home, encourage movement .  See PT plan        Problem List Patient Active Problem List   Diagnosis Date Noted  . 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 Osteoporosis, unspecified 07/30/2012  . Depression and Anxiety - 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 in the past 07/16/2012  . Essential hypertension, benign 07/16/2012  . GERD (  gastroesophageal reflux disease) 07/16/2012  . Neuralgia 07/16/2012                                           Melvenia Needles, PTA 12/02/2013 3:16 PM Phone: [phone] Fax: [phone]     HARRIS,KAREN 12/02/2013, 3:12 PM

## 2013-12-03 ENCOUNTER — Ambulatory Visit: Payer: BC Managed Care – PPO | Admitting: Physical Therapy

## 2013-12-03 DIAGNOSIS — Z5189 Encounter for other specified aftercare: Secondary | ICD-10-CM | POA: Diagnosis not present

## 2013-12-03 DIAGNOSIS — M436 Torticollis: Secondary | ICD-10-CM

## 2013-12-03 DIAGNOSIS — R293 Abnormal posture: Secondary | ICD-10-CM

## 2013-12-03 DIAGNOSIS — M542 Cervicalgia: Secondary | ICD-10-CM

## 2013-12-03 NOTE — Patient Instructions (Signed)
Continue with exercises that seem to help.  Establish a routine of exercise  And taking care of herself.

## 2013-12-03 NOTE — Therapy (Signed)
Physical Therapy Treatment  Patient Details  Name: Dawn Reyes MRN: [id number] Date of Birth: 05/08/1950  Encounter Date: 12/03/2013      PT End of Session - 12/03/13 1527    Visit Number 9   Number of Visits 16   Date for PT Re-Evaluation 12/24/13   PT Start Time 3154   PT Stop Time 1535   PT Time Calculation (min) 73 min   Activity Tolerance Patient tolerated treatment well      Past Medical History  Diagnosis Date  . Candidiasis of mouth   . Anxiety   . Benign hypertension   . GERD (gastroesophageal reflux disease)   . Hematochezia 02/13/2012  . Left sided abdominal pain 02/13/2012  . Fibromyalgia   . 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 Osteoporosis, unspecified 07/30/2012  . Depression and Anxiety - 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 07/16/2012    Past Surgical History  Procedure Laterality Date  . Abdominal hysterectomy    . Foot surgery Bilateral   . Tonsillectomy    . Breast lumpectomy Right     There were no vitals taken for this visit.  Visit Diagnosis:  Cervicalgia  Stiffness of neck  Poor posture      Subjective Assessment - 12/03/13 1424    Symptoms Feels sore between shoulder blades To C7.  Able to cook today having a good day   Currently in Pain? Yes   Pain Score 3    Pain Location Back   Pain Orientation Upper   Pain Descriptors / Indicators Constant   Pain Radiating Towards C7   Pain Onset More than a month ago   Pain Frequency Constant   Aggravating Factors  Today it is just there . Nothing I do makes it better oer or worse.   Pain Relieving Factors Massage   Multiple Pain Sites No   Pain Score 0            [facility] Adult PT Treatment/Exercise - 12/03/13 1430    Moist Heat Therapy   Number Minutes Moist Heat 15 Minutes   Moist Heat Location --  Back, neck   Manual Therapy   Massage --  Neuromuscular trigger point release to neck and upper back.     Self care:  Instructions for taking care of herself first.  Take frequent rests  during holidays. Continue with her exercises and good posture techniques.        PT Short Term Goals - 12/03/13 1533    PT SHORT TERM GOAL #2   Title report pain decreased 25% in neck   Time 4   Period Weeks   Status Not Met   PT SHORT TERM GOAL #3   Title report pain decreased 25% in upper back   Time 4   Period Weeks   Status Not Met   PT SHORT TERM GOAL #4   Title active cervical rotation and sidebending equal to RT   Time 4   Period Weeks   Status Not Met          PT Long Term Goals - 12/03/13 1533    PT LONG TERM GOAL #1   Title demo understanding of good posture and mechanics   Time 8   Period Weeks   PT LONG TERM GOAL #2   Time 8          Plan - 12/03/13 1529    Clinical Impression Statement Patient relates that PT does not really change her pain for  very long.  She understands what to do at home and is ready for discharge.  Goals for home exercise and body mechanics achieved. pain goals not met.   PT Plan D/C to home exercise program at patien't request due to lack of progress toward pain goals.        Problem List Patient Active Problem List   Diagnosis Date Noted  . 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 Osteoporosis, unspecified 07/30/2012  . Depression and Anxiety - 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 in the past 07/16/2012  . Essential hypertension, benign 07/16/2012  . GERD (gastroesophageal reflux disease) 07/16/2012  . Neuralgia 07/16/2012                                             Melvenia Needles, PTA 12/03/2013 3:38 PM Phone: [phone] Fax: [phone]   Kaitelyn Jamison PTA 12/03/2013, 3:38 PM

## 2013-12-08 ENCOUNTER — Ambulatory Visit: Payer: BC Managed Care – PPO

## 2013-12-08 ENCOUNTER — Encounter: Payer: Self-pay | Admitting: Physical Therapy

## 2013-12-08 NOTE — Therapy (Signed)
  Patient Details  Name: Dawn Reyes MRN: [id number] Date of Birth: Apr 19, 1950  Encounter Date: 12/08/2013   PHYSICAL THERAPY DISCHARGE SUMMARY  Visits from Start of Care: 8  Current functional level related to goals / functional outcomes: See below   Remaining deficits: Cervical AROM Flexion 50 deg, Ext 55 deg, Rot.Rt. 60, Lt. 25 deg.  Sidebending Rt. 32 deg and LT. 38 deg.    Education / Equipment: Posture, HEP  Plan: Patient agrees to discharge.  Patient goals were partially met. Patient is being discharged due to the patient's request.  ?????      PT LONG TERM GOAL #1    Time  8    Period  Weeks    Status  Achieved    PT LONG TERM GOAL #2    Title  Independent advanced HEP    Time  8    Period  Weeks    Status  Achieved    PT LONG TERM GOAL #3    Title  Pt. Report pain decreased 75% in neck    Time  8    Period  Weeks    Status  Not Met    PT LONG TERM GOAL #4    Title  Report pain decrased 75% in upper back    Time  8    Period  Weeks    Status  Not Met    PT LONG TERM GOAL #5    Title  Pr report performing normal home tasks with minimal pain    Time  8    Period  Weeks    Status  Not Met    PT LONG TERM GOAL #6    Title  sleep 8 hours without wake due to pain    Time  8    Period  Weeks    Status  Not Met    PT LONG TERM GOAL #7    Time  8    Period  Weeks    Status  Not Met        12/03/13 [place], PT 12/08/2013 9:31 AM Phone: [phone] Fax: [phone]  Mads Borgmeyer 12/08/2013, 9:28 AM

## 2013-12-12 ENCOUNTER — Encounter: Payer: BC Managed Care – PPO | Admitting: Physical Therapy

## 2013-12-16 ENCOUNTER — Encounter: Payer: BC Managed Care – PPO | Admitting: Physical Therapy

## 2013-12-19 ENCOUNTER — Encounter: Payer: BC Managed Care – PPO | Admitting: Physical Therapy

## 2013-12-22 ENCOUNTER — Encounter: Payer: BC Managed Care – PPO | Admitting: Physical Therapy

## 2013-12-24 ENCOUNTER — Encounter: Payer: BC Managed Care – PPO | Admitting: Physical Therapy

## 2014-01-29 ENCOUNTER — Ambulatory Visit (INDEPENDENT_AMBULATORY_CARE_PROVIDER_SITE_OTHER): Payer: BLUE CROSS/BLUE SHIELD | Admitting: Family Medicine

## 2014-01-29 ENCOUNTER — Encounter: Payer: Self-pay | Admitting: Family Medicine

## 2014-01-29 VITALS — BP 136/88 | HR 69 | Temp 97.4°F | Ht 61.75 in | Wt 144.9 lb

## 2014-01-29 DIAGNOSIS — K219 Gastro-esophageal reflux disease without esophagitis: Secondary | ICD-10-CM

## 2014-01-29 DIAGNOSIS — Z0000 Encounter for general adult medical examination without abnormal findings: Secondary | ICD-10-CM

## 2014-01-29 DIAGNOSIS — F329 Major depressive disorder, single episode, unspecified: Secondary | ICD-10-CM

## 2014-01-29 DIAGNOSIS — F32A Depression, unspecified: Secondary | ICD-10-CM

## 2014-01-29 DIAGNOSIS — M542 Cervicalgia: Secondary | ICD-10-CM

## 2014-01-29 DIAGNOSIS — I10 Essential (primary) hypertension: Secondary | ICD-10-CM

## 2014-01-29 DIAGNOSIS — E785 Hyperlipidemia, unspecified: Secondary | ICD-10-CM

## 2014-01-29 LAB — BASIC METABOLIC PANEL
BUN: 12 mg/dL (ref 6–23)
CALCIUM: 9.5 mg/dL (ref 8.4–10.5)
CO2: 32 meq/L (ref 19–32)
Chloride: 102 mEq/L (ref 96–112)
Creatinine, Ser: 0.65 mg/dL (ref 0.40–1.20)
GFR: 97.43 mL/min (ref >60.00)
Glucose, Bld: 91 mg/dL (ref 70–99)
POTASSIUM: 4.4 meq/L (ref 3.5–5.1)
Sodium: 140 mEq/L (ref 135–145)

## 2014-01-29 LAB — LIPID PANEL
CHOL/HDL RATIO: 3
Cholesterol: 223 mg/dL — ABNORMAL HIGH (ref 0–200)
HDL: 76.8 mg/dL (ref >39.00)
LDL Cholesterol: 112 mg/dL — ABNORMAL HIGH (ref 0–99)
NonHDL: 146.2
Triglycerides: 170 mg/dL — ABNORMAL HIGH (ref 0.0–149.0)
VLDL: 34 mg/dL (ref 0.0–40.0)

## 2014-01-29 LAB — HEMOGLOBIN A1C: HEMOGLOBIN A1C: 5.7 % (ref 4.6–6.5)

## 2014-01-29 NOTE — Patient Instructions (Signed)
BEFORE YOU LEAVE: -labs -schedule follow up in 6 months  -We have ordered labs or studies at this visit. It can take up to 1-2 weeks for results and processing. We will contact you with instructions IF your results are abnormal. Normal results will be released to your [facility]. If you have not heard from [place] or can not find your results in [facility] in 2 weeks please contact our office.  -PLEASE SIGN UP FOR MYCHART TODAY   We recommend the following healthy lifestyle measures: - eat a healthy diet consisting of lots of vegetables, fruits, beans, nuts, seeds, healthy meats such as white chicken and fish and whole grains.  - avoid fried foods, fast food, processed foods, sodas, red meet and other fattening foods.  - get a least 150 minutes of aerobic exercise per week.

## 2014-01-29 NOTE — Progress Notes (Signed)
HPI:  Here for CPE:  -Concerns and/or follow up today:   Neck/back pain: -started after painting  -had PT and referred to PMR - she reports was feeling better during PT, but has returns once therapy done, seeing PMR next week -reports: unchanged -denies: weakness or numbness in arms, radiation, fevers, malaise -facet arthropathy, DDD   HTN: -increased hctz last visit -reports: stable Denies: CP, SOB, DOE  Osteoporosis: -sees Dr. Dwyane Dee -on reclast, takes vit D  Hx Depression: -used to see Dr. Toy Care -reports mild symptoms but doe snot feel needs counseling or meds currently as feels ok -denies: SI or thoughts of self harm   -Diet: not great  -Exercise: no regular exercise, does wi fit  -Taking folic acid, vitamin D or calcium: taking vitamin d  -Diabetes and Dyslipidemia Screening: ok 1 year ago  -Hx of HTN: yes  -Vaccines: UTD, refuses flu vaccine  -pap history: n/a s/p hysterectomy  -FDLMP: n/a  -sexual activity: no new partners  -wants STI testing: no  -FH breast, colon or ovarian ca: see FH Last mammogram: utd Last colon cancer screening: done, 2014  Breast Ca Risk Assessment: -no first degree relatives with breast or ovarian ca she has hx neg lumpectomy  -Alcohol, Tobacco, drug use: see social history  Review of Systems - no fevers, unintentional weight loss, vision loss, hearing loss, chest pain, sob, hemoptysis, melena, hematochezia, hematuria, genital discharge, changing or concerning skin lesions, bleeding, bruising, loc, thoughts of self harm or SI  Past Medical History  Diagnosis Date  . Candidiasis of mouth   . Anxiety   . Benign hypertension   . GERD (gastroesophageal reflux disease)   . Hematochezia 02/13/2012  . Left sided abdominal pain 02/13/2012  . Fibromyalgia   . 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 Osteoporosis, unspecified 07/30/2012  . Depression and Anxiety - 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 07/16/2012    Past Surgical History   Procedure Laterality Date  . Abdominal hysterectomy    . Foot surgery Bilateral   . Tonsillectomy    . Breast lumpectomy Right     Family History  Problem Relation Age of Onset  . Hypertension Father   . Lung cancer Father   . Liver cancer Father   . Lung cancer Mother   . Hypertension Sister   . Osteoporosis Sister   . Hypertension Brother   . Arthritis Brother   . Diabetes Neg Hx   . Epilepsy Father     History   Social History  . Marital Status: Married    Spouse Name: N/A    Number of Children: 2  . 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 Smoking status: Never Smoker   . Smokeless tobacco: Never Used  . Alcohol Use: No  . Drug Use: No  . Sexual Activity: None   Other Topics Concern  . None   Social History Narrative   Work or School: [employer]      Home Situation: lives with husband      Spiritual Beliefs: Baptist      Lifestyle: no regular exercise; diet is ok                 Current outpatient prescriptions:  .  CALCIUM PO, Take by mouth., Disp: , Rfl:  .  Cholecalciferol (VITAMIN D3) 2000 UNITS TABS, Take 1 tablet by mouth daily., Disp: , Rfl:  .  gabapentin (NEURONTIN) 300 MG capsule, Take 1 capsule (300 mg total) by mouth 3 (three) times daily., Disp: 90 capsule, Rfl: 11 .  hydrochlorothiazide (HYDRODIURIL) 25 MG tablet, Take 1 tablet (25 mg total) by mouth daily., Disp: 90 tablet, Rfl: 3 .  Krill Oil 300 MG CAPS, Take 1 capsule by mouth daily., Disp: , Rfl:  .  magnesium 30 MG tablet, Take 30 mg by mouth 2 (two) times daily., Disp: , Rfl:  .  Multiple Vitamin (MULTIVITAMIN) tablet, Take 1 tablet by mouth daily., Disp: , Rfl:  .  zoledronic acid (RECLAST) 5 MG/100ML SOLN injection, Inject 100 mLs (5 mg total) into the vein once., Disp: 100 mL, Rfl: 0  EXAM:  Filed Vitals:   01/29/14 0942  BP: 136/88  Pulse: 69  Temp: 97.4 F (36.3 C)    GENERAL: vitals reviewed and  listed below, alert, oriented, appears well hydrated and in no acute distress  HEENT: head atraumatic, PERRLA, normal appearance of eyes, ears, nose and mouth. moist mucus membranes.  NECK: supple, no masses or lymphadenopathy  LUNGS: clear to auscultation bilaterally, no rales, rhonchi or wheeze  CV: HRRR, no peripheral edema or cyanosis, normal pedal pulses  BREAST: offered and declined  ABDOMEN: bowel sounds normal, soft, non tender to palpation, no masses, no rebound or guarding  GU: offered and declined  RECTAL: offered and declined  SKIN: no rash or abnormal lesions  MS: normal gait, moves all extremities normally  NEURO: CN II-XII grossly intact, normal muscle strength and sensation to light touch on extremities  PSYCH: normal affect, pleasant and cooperative  ASSESSMENT AND PLAN:  Discussed the following assessment and plan:  Essential hypertension, benign - Plan: Basic metabolic panel  Visit for preventive health examination - Plan: Hemoglobin A1c -Discussed and advised all [place] preventive services health task force level A and B recommendations for age, sex and risks.  -Advised at least 150 minutes of exercise per week and a healthy diet low in saturated fats and sweets and consisting of fresh fruits and vegetables, lean meats such as fish and white chicken and whole grains.  -FASING labs, studies and vaccines per orders this encounter  Mild hyperlipidemia - Plan: Lipid panel  Gastroesophageal reflux disease without esophagitis  Depression and Anxiety - 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 in the past -declined intervention or treatment currently for mild symptoms  Neck pain and back pain - persists and seeing PMR next week, await report from this eval    Orders Placed This Encounter  Procedures  . Lipid panel  . Hemoglobin A1c  . Basic metabolic panel    Patient advised to return to clinic immediately if symptoms worsen or persist or new  concerns.  Patient Instructions  BEFORE YOU LEAVE: -labs -schedule follow up in 6 months  -We have ordered labs or studies at this visit. It can take up to 1-2 weeks for results and processing. We will contact you with instructions IF your results are abnormal. Normal results will be released to your [facility]. If you have not heard from [place] or can not find your results in [facility] in 2 weeks please contact our office.  -PLEASE SIGN UP FOR MYCHART TODAY   We recommend the following healthy lifestyle measures: - eat a healthy diet consisting of lots of vegetables, fruits, beans, nuts, seeds, healthy meats such as white chicken and fish and whole grains.  - avoid fried foods, fast food, processed foods, sodas, red meet and other fattening foods.  -  get a least 150 minutes of aerobic exercise per week.        No Follow-up on file.  Dawn Reyes R.

## 2014-01-29 NOTE — Progress Notes (Signed)
Pre visit review using our clinic review tool, if applicable. No additional management support is needed unless otherwise documented below in the visit note. 

## 2014-02-03 ENCOUNTER — Ambulatory Visit (HOSPITAL_BASED_OUTPATIENT_CLINIC_OR_DEPARTMENT_OTHER): Payer: BLUE CROSS/BLUE SHIELD | Admitting: Physical Medicine & Rehabilitation

## 2014-02-03 ENCOUNTER — Encounter: Payer: Self-pay | Admitting: Physical Medicine & Rehabilitation

## 2014-02-03 ENCOUNTER — Encounter: Payer: BLUE CROSS/BLUE SHIELD | Attending: Physical Medicine & Rehabilitation

## 2014-02-03 ENCOUNTER — Other Ambulatory Visit: Payer: Self-pay | Admitting: Physical Medicine & Rehabilitation

## 2014-02-03 VITALS — BP 146/92 | HR 76 | Resp 14

## 2014-02-03 DIAGNOSIS — R293 Abnormal posture: Secondary | ICD-10-CM | POA: Insufficient documentation

## 2014-02-03 DIAGNOSIS — Z79899 Other long term (current) drug therapy: Secondary | ICD-10-CM

## 2014-02-03 DIAGNOSIS — M436 Torticollis: Secondary | ICD-10-CM | POA: Diagnosis not present

## 2014-02-03 DIAGNOSIS — M542 Cervicalgia: Secondary | ICD-10-CM | POA: Diagnosis present

## 2014-02-03 DIAGNOSIS — Z5181 Encounter for therapeutic drug level monitoring: Secondary | ICD-10-CM

## 2014-02-03 DIAGNOSIS — M791 Myalgia: Secondary | ICD-10-CM

## 2014-02-03 DIAGNOSIS — M7918 Myalgia, other site: Secondary | ICD-10-CM

## 2014-02-03 NOTE — Patient Instructions (Signed)
Trochanteric Bursitis You have hip pain due to trochanteric bursitis. Bursitis means that the sack near the outside of the hip is filled with fluid and inflamed. This sack is made up of protective soft tissue. The pain from trochanteric bursitis can be severe and keep you from sleep. It can radiate to the buttocks or down the outside of the thigh to the knee. The pain is almost always worse when rising from the seated or lying position and with walking. Pain can improve after you take a few steps. It happens more often in people with hip joint and lumbar spine problems, such as arthritis or previous surgery. Very rarely the trochanteric bursa can become infected, and antibiotics and/or surgery may be needed. Treatment often includes an injection of local anesthetic mixed with cortisone medicine. This medicine is injected into the area where it is most tender over the hip. Repeat injections may be necessary if the response to treatment is slow. You can apply ice packs over the tender area for 30 minutes every 2 hours for the next few days. Anti-inflammatory and/or narcotic pain medicine may also be helpful. Limit your activity for the next few days if the pain continues. See your caregiver in 5-10 days if you are not greatly improved.  SEEK IMMEDIATE MEDICAL CARE IF:  You develop severe pain, fever, or increased redness.  You have pain that radiates below the knee. EXERCISES STRETCHING EXERCISES - Trochanteric Bursitis  These exercises may help you when beginning to rehabilitate your injury. Your symptoms may resolve with or without further involvement from your physician, physical therapist, or athletic trainer. While completing these exercises, remember:   Restoring tissue flexibility helps normal motion to return to the joints. This allows healthier, less painful movement and activity.  An effective stretch should be held for at least 30 seconds.  A stretch should never be painful. You should only  feel a gentle lengthening or release in the stretched tissue. STRETCH - Iliotibial Band  On the floor or bed, lie on your side so your injured leg is on top. Bend your knee and grab your ankle.  Slowly bring your knee back so that your thigh is in line with your trunk. Keep your heel at your buttocks and gently arch your back so your head, shoulders and hips line up.  Slowly lower your leg so that your knee approaches the floor/bed until you feel a gentle stretch on the outside of your thigh. If you do not feel a stretch and your knee will not fall farther, place the heel of your opposite foot on top of your knee and pull your thigh down farther.  Hold this stretch for __________ seconds.  Repeat __________ times. Complete this exercise __________ times per day. STRETCH - Hamstrings, Supine   Lie on your back. Loop a belt or towel over the ball of your foot as shown.  Straighten your knee and slowly pull on the belt to raise your injured leg. Do not allow the knee to bend. Keep your opposite leg flat on the floor.  Raise the leg until you feel a gentle stretch behind your knee or thigh. Hold this position for __________ seconds.  Repeat __________ times. Complete this stretch __________ times per day. STRETCH - Quadriceps, Prone   Lie on your stomach on a firm surface, such as a bed or padded floor.  Bend your knee and grasp your ankle. If you are unable to reach your ankle or pant leg, use a belt   around your foot to lengthen your reach.  Gently pull your heel toward your buttocks. Your knee should not slide out to the side. You should feel a stretch in the front of your thigh and/or knee.  Hold this position for __________ seconds.  Repeat __________ times. Complete this stretch __________ times per day. STRETCHING - Hip Flexors, Lunge Half kneel with your knee on the floor and your opposite knee bent and directly over your ankle.  Keep good posture with your head over your  shoulders. Tighten your buttocks to point your tailbone downward; this will prevent your back from arching too much.  You should feel a gentle stretch in the front of your thigh and/or hip. If you do not feel any resistance, slightly slide your opposite foot forward and then slowly lunge forward so your knee once again lines up over your ankle. Be sure your tailbone remains pointed downward.  Hold this stretch for __________ seconds.  Repeat __________ times. Complete this stretch __________ times per day. STRETCH - Adductors, Lunge  While standing, spread your legs.  Lean away from your injured leg by bending your opposite knee. You may rest your hands on your thigh for balance.  You should feel a stretch in your inner thigh. Hold for __________ seconds.  Repeat __________ times. Complete this exercise __________ times per day. Document Released: 02/03/2004 Document Revised: 05/12/2013 Document Reviewed: 04/09/2008 [employer] Patient Information 2015 [employer]. This information is not intended to replace advice given to you by your health care provider. Make sure you discuss any questions you have with your health care provider.  
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
## 2014-02-04 LAB — PMP ALCOHOL METABOLITE (ETG): Ethyl Glucuronide (EtG): NEGATIVE ng/mL

## 2014-02-05 LAB — PRESCRIPTION MONITORING PROFILE (SOLSTAS)
Amphetamine/Meth: NEGATIVE ng/mL
Barbiturate Screen, Urine: NEGATIVE ng/mL
Benzodiazepine Screen, Urine: NEGATIVE ng/mL
Buprenorphine, Urine: NEGATIVE ng/mL
CREATININE, URINE: 25.38 mg/dL (ref >20.0)
Cannabinoid Scrn, Ur: NEGATIVE ng/mL
Carisoprodol, Urine: NEGATIVE ng/mL
Cocaine Metabolites: NEGATIVE ng/mL
Fentanyl, Ur: NEGATIVE ng/mL
MDMA URINE: NEGATIVE ng/mL
Meperidine, Ur: NEGATIVE ng/mL
Methadone Screen, Urine: NEGATIVE ng/mL
Nitrites, Initial: NEGATIVE ug/mL
OXYCODONE SCRN UR: NEGATIVE ng/mL
Opiate Screen, Urine: NEGATIVE ng/mL
Propoxyphene: NEGATIVE ng/mL
TAPENTADOLUR: NEGATIVE ng/mL
Tramadol Scrn, Ur: NEGATIVE ng/mL
Zolpidem, Urine: NEGATIVE ng/mL
pH, Initial: 5.5 pH (ref 4.5–8.9)

## 2014-02-10 NOTE — Progress Notes (Signed)
Urine drug screen for this encounter is consistent for no medication. 

## 2014-02-16 ENCOUNTER — Encounter: Payer: Self-pay | Admitting: Physical Medicine & Rehabilitation

## 2014-02-16 ENCOUNTER — Encounter: Payer: BLUE CROSS/BLUE SHIELD | Attending: Physical Medicine & Rehabilitation

## 2014-02-16 ENCOUNTER — Ambulatory Visit (HOSPITAL_BASED_OUTPATIENT_CLINIC_OR_DEPARTMENT_OTHER): Payer: BLUE CROSS/BLUE SHIELD | Admitting: Physical Medicine & Rehabilitation

## 2014-02-16 VITALS — BP 137/84 | HR 64 | Resp 14

## 2014-02-16 DIAGNOSIS — M436 Torticollis: Secondary | ICD-10-CM | POA: Diagnosis not present

## 2014-02-16 DIAGNOSIS — M7062 Trochanteric bursitis, left hip: Secondary | ICD-10-CM

## 2014-02-16 DIAGNOSIS — R293 Abnormal posture: Secondary | ICD-10-CM | POA: Diagnosis not present

## 2014-02-16 DIAGNOSIS — M542 Cervicalgia: Secondary | ICD-10-CM | POA: Insufficient documentation

## 2014-02-16 MED ORDER — DULOXETINE HCL 20 MG PO CPEP: 20.0000 mg | ORAL_CAPSULE | Freq: Every day | ORAL | Status: DC

## 2014-02-16 NOTE — Progress Notes (Signed)
Left Trochanteric bursa injection  without ultrasound guidance  Indication Trochanteric bursitis. Exam has tenderness over the greater trochanter of the hip. Pain has not responded to conservative care such as exercise therapy and oral medications. Pain interferes with sleep or with mobility Informed consent was obtained after describing risks and benefits of the procedure with the patient these include bleeding bruising and infection. Patient has signed written consent form. Patient placed in a lateral decubitus position with the affected hip superior. Point of maximal pain was palpated marked and prepped with Betadine and entered with a needle to bone contact. Needle slightly withdrawn then 6mg  of betamethasone with 4 cc 1% lidocaine were injected. Patient tolerated procedure well. Post procedure instructions given.

## 2014-02-16 NOTE — Patient Instructions (Signed)
If not much improvement, would recommend ultrasound guided injection

## 2014-02-18 NOTE — Telephone Encounter (Signed)
error 

## 2014-02-27 ENCOUNTER — Encounter: Payer: Self-pay | Admitting: Neurology

## 2014-02-27 ENCOUNTER — Ambulatory Visit (INDEPENDENT_AMBULATORY_CARE_PROVIDER_SITE_OTHER): Payer: BLUE CROSS/BLUE SHIELD | Admitting: Neurology

## 2014-02-27 VITALS — BP 124/66 | HR 76 | Resp 18 | Ht 61.0 in | Wt 144.1 lb

## 2014-02-27 DIAGNOSIS — K146 Glossodynia: Secondary | ICD-10-CM

## 2014-02-27 NOTE — Patient Instructions (Signed)
Continue gabapentin 600mg  three times daily Let's see if the Cymbalta that you were started on for fibromyalgia will help with the tongue pain as well.  Call with questions or concerns. Follow up in 6 months

## 2014-02-27 NOTE — Progress Notes (Signed)
NEUROLOGY FOLLOW UP OFFICE NOTE  Dawn Reyes [id number]  HISTORY OF PRESENT ILLNESS: Dawn Reyes is a 64 year old woman with hypertension, osteoarthritis, depression and fibromyalgia who follows up for atypical left-sided facial pain and burning mouth syndrome  UPDATE: Currently taking gabapentin 600mg  three times daily.   The neuralgia has been controlled up until 2 weeks ago.  It is not as severe.  She was started on Cymbalta 20mg  a week ago for fibromyalgia. Due to new headache from last visit, a Sed Rate was checked and was 1.   She has not had any recurrent headaches, however.  HISTORY: She began noticing symptoms last year. The symptoms correlate soon after the dental procedure involving 2 canals of 2 of her right-sided upper teeth. She did see the dentist for this, who said that her symptoms are unlikely related.  She notes a "discomfort "on the left side of her face, involving the maxilla to the temple. It is not really a pain. However, she will occasionally have a brief shooting sensation, about a 5/10.  It is sensitive to the touch.  It appears to be sensitive when she is putting on her makeup. She is never in extreme pain however. She also has a burning sensation of her tongue. This also is a 5/10 in intensity. It is less painful when she is eating or drinking.   Lidocaine mouth rinse provided only brief relief.  Ice is soothing.   PAST MEDICAL HISTORY: Past Medical History  Diagnosis Date  . Candidiasis of mouth   . Anxiety   . Benign hypertension   . GERD (gastroesophageal reflux disease)   . Hematochezia 02/13/2012  . Left sided abdominal pain 02/13/2012  . Fibromyalgia   . 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 Osteoporosis, unspecified 07/30/2012  . Depression and Anxiety - 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 07/16/2012    MEDICATIONS: Current Outpatient Prescriptions on File Prior to Visit  Medication Sig Dispense Refill  . CALCIUM PO Take by mouth.    .  Cholecalciferol (VITAMIN D3) 2000 UNITS TABS Take 1 tablet by mouth daily.    . DULoxetine (CYMBALTA) 20 MG capsule Take 1 capsule (20 mg total) by mouth daily. 30 capsule 1  . gabapentin (NEURONTIN) 300 MG capsule Take 1 capsule (300 mg total) by mouth 3 (three) times daily. 90 capsule 11  . hydrochlorothiazide (HYDRODIURIL) 25 MG tablet Take 1 tablet (25 mg total) by mouth daily. 90 tablet 3  . Krill Oil 300 MG CAPS Take 1 capsule by mouth daily.    . magnesium 30 MG tablet Take 30 mg by mouth 2 (two) times daily.    . Multiple Vitamin (MULTIVITAMIN) tablet Take 1 tablet by mouth daily.    . zoledronic acid (RECLAST) 5 MG/100ML SOLN injection Inject 100 mLs (5 mg total) into the vein once. 100 mL 0   No current facility-administered medications on file prior to visit.    ALLERGIES: Allergies  Allergen Reactions  . Vicodin [Hydrocodone-Acetaminophen] Itching    FAMILY HISTORY: Family History  Problem Relation Age of Onset  . Hypertension Father   . Lung cancer Father   . Liver cancer Father   . Lung cancer Mother   . Hypertension Sister   . Osteoporosis Sister   . Hypertension Brother   . Arthritis Brother   . Diabetes Neg Hx   . Epilepsy Father     SOCIAL HISTORY: History   Social History  . Marital Status: Married  Spouse Name: N/A  . Number of Children: 2  . 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 Smoking status: Never Smoker   . Smokeless tobacco: Never Used  . Alcohol Use: No  . Drug Use: No  . Sexual Activity: Not on file   Other Topics Concern  . Not on file   Social History Narrative   Work or School: [employer]      Home Situation: lives with husband      Spiritual Beliefs: Baptist      Lifestyle: no regular exercise; diet is ok                REVIEW OF SYSTEMS: Constitutional: No fevers, chills, or sweats, no generalized fatigue, change in appetite Eyes: No visual changes,  double vision, eye pain Ear, nose and throat: No hearing loss, ear pain, nasal congestion, sore throat Cardiovascular: No chest pain, palpitations Respiratory:  No shortness of breath at rest or with exertion, wheezes GastrointestinaI: No nausea, vomiting, diarrhea, abdominal pain, fecal incontinence Genitourinary:  No dysuria, urinary retention or frequency Musculoskeletal:  Neck pain Integumentary: No rash, pruritus, skin lesions Neurological: as above Psychiatric: No depression, insomnia, anxiety Endocrine: No palpitations, fatigue, diaphoresis, mood swings, change in appetite, change in weight, increased thirst Hematologic/Lymphatic:  No anemia, purpura, petechiae. Allergic/Immunologic: no itchy/runny eyes, nasal congestion, recent allergic reactions, rashes  PHYSICAL EXAM: Filed Vitals:   02/27/14 1535  BP: 124/66  Pulse: 76  Resp: 18   General: No acute distress Head:  Normocephalic/atraumatic Eyes:  Fundoscopic exam unremarkable without vessel changes, exudates, hemorrhages or papilledema. Neck: supple, no paraspinal tenderness, full range of motion Heart:  Regular rate and rhythm Lungs:  Clear to auscultation bilaterally Back: No paraspinal tenderness Neurological Exam: alert and oriented to person, place, and time. Attention span and concentration intact, recent and remote memory intact, fund of knowledge intact.  Speech fluent and not dysarthric, language intact.  CN II-XII intact. Fundoscopic exam unremarkable without vessel changes, exudates, hemorrhages or papilledema.  Bulk and tone normal, muscle strength 5/5 throughout.  Deep tendon reflexes 2+ throughout.  Finger to nose testing intact.  Gait normal  IMPRESSION: Burning mouth syndrome  PLAN: Continue gabapentin 600mg  three times daily We will see if the Cymbalta that you were started on for fibromyalgia will help with the tongue pain as well.   Follow up in 6 months  Metta Clines, DO  CC: Colin Benton,  DO

## 2014-04-13 ENCOUNTER — Ambulatory Visit: Payer: BLUE CROSS/BLUE SHIELD | Admitting: Physical Medicine & Rehabilitation

## 2014-06-19 ENCOUNTER — Ambulatory Visit (INDEPENDENT_AMBULATORY_CARE_PROVIDER_SITE_OTHER): Payer: BLUE CROSS/BLUE SHIELD | Admitting: Family Medicine

## 2014-06-19 ENCOUNTER — Encounter: Payer: Self-pay | Admitting: Family Medicine

## 2014-06-19 VITALS — BP 116/78 | HR 81 | Temp 98.0°F | Ht 61.0 in | Wt 142.4 lb

## 2014-06-19 DIAGNOSIS — F39 Unspecified mood [affective] disorder: Secondary | ICD-10-CM

## 2014-06-19 MED ORDER — PAROXETINE HCL 20 MG PO TABS: 20.0000 mg | ORAL_TABLET | Freq: Every day | ORAL | Status: DC

## 2014-06-19 NOTE — Progress Notes (Signed)
HPI:  Acute visit for:  Depression: -hx of depression and anxiety treated by Dr Toy Care in the past -then off medications for some time -acute worsening recently with sudden passing of husband 05/27/14 of cancer dx 1 week before he died -she has good support from her children -symptoms: just feels overwhelmed and sad, the house feels empty, cries a lot, anger at times, panic when family members leave, poor sleep, deep sadness -denies: SI, thoughts of self harm -meds that worked in the past included paxil and she wants to restart  ROS: See pertinent positives and negatives per HPI.  Past Medical History  Diagnosis Date  . Candidiasis of mouth   . Anxiety   . Benign hypertension   . GERD (gastroesophageal reflux disease)   . Hematochezia 02/13/2012  . Left sided abdominal pain 02/13/2012  . Fibromyalgia   . 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 Osteoporosis, unspecified 07/30/2012  . Depression and Anxiety - 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 07/16/2012    Past Surgical History  Procedure Laterality Date  . Abdominal hysterectomy    . Foot surgery Bilateral   . Tonsillectomy    . Breast lumpectomy Right     Family History  Problem Relation Age of Onset  . Hypertension Father   . Lung cancer Father   . Liver cancer Father   . Lung cancer Mother   . Hypertension Sister   . Osteoporosis Sister   . Hypertension Brother   . Arthritis Brother   . Diabetes Neg Hx   . Epilepsy Father     History   Social History  . Marital Status: Married    Spouse Name: N/A  . Number of Children: 2  . 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 Smoking status: Never Smoker   . Smokeless tobacco: Never Used  . Alcohol Use: No  . Drug Use: No  . Sexual Activity: Not on file   Other Topics Concern  . None   Social History Narrative   Work or School: [employer]      Home Situation: lives with husband      Spiritual Beliefs:  Baptist      Lifestyle: no regular exercise; diet is ok                 Current outpatient prescriptions:  .  CALCIUM PO, Take by mouth., Disp: , Rfl:  .  Cholecalciferol (VITAMIN D3) 2000 UNITS TABS, Take 1 tablet by mouth daily., Disp: , Rfl:  .  DULoxetine (CYMBALTA) 20 MG capsule, Take 1 capsule (20 mg total) by mouth daily., Disp: 30 capsule, Rfl: 1 .  gabapentin (NEURONTIN) 300 MG capsule, Take 1 capsule (300 mg total) by mouth 3 (three) times daily., Disp: 90 capsule, Rfl: 11 .  hydrochlorothiazide (HYDRODIURIL) 25 MG tablet, Take 1 tablet (25 mg total) by mouth daily., Disp: 90 tablet, Rfl: 3 .  Krill Oil 300 MG CAPS, Take 1 capsule by mouth daily., Disp: , Rfl:  .  magnesium 30 MG tablet, Take 30 mg by mouth 2 (two) times daily., Disp: , Rfl:  .  Multiple Vitamin (MULTIVITAMIN) tablet, Take 1 tablet by mouth daily., Disp: , Rfl:  .  zoledronic acid (RECLAST) 5 MG/100ML SOLN injection, Inject 100 mLs (5 mg total) into the vein once., Disp: 100 mL, Rfl: 0 .  PARoxetine (PAXIL) 20 MG tablet, Take 1 tablet (20 mg total)  by mouth daily., Disp: 30 tablet, Rfl: 3  EXAM:  Filed Vitals:   06/19/14 1423  BP: 116/78  Pulse: 81  Temp: 98 F (36.7 C)    Body mass index is 26.92 kg/(m^2).  GENERAL: vitals reviewed and listed above, alert, oriented, appears well hydrated and in no acute distress  HEENT: atraumatic, conjunttiva clear, no obvious abnormalities on inspection of external nose and ears  NECK: no obvious masses on inspection  LUNGS: clear to auscultation bilaterally, no wheezes, rales or rhonchi, good air movement  CV: HRRR, no peripheral edema  MS: moves all extremities without noticeable abnormality  PSYCH: sad, tearful  ASSESSMENT AND PLAN:  Discussed the following assessment and plan:  Mood disorder - Plan: PARoxetine (PAXIL) 20 MG tablet  -depression, anxiety in setting of recent loss but with sig PMH depression and anxiety -this is so sad, supported,  counseled -discussed first line tx for bereavment with CBT and provided info -she wants to restart paxil as well and understands risks and that it is not usually used in setting of behreavment - provided rx, but advised to try cbt first -close follow up in 1 month -Patient advised to return or notify a doctor immediately if symptoms worsen or persist or new concerns arise.  Patient Instructions  BEFORE YOU LEAVE: -follow up in 1 month  Paxil 20mg  (one tablet daily). Do not stop this medication suddenly.   Please call to set up counseling with Dr. Glennon Hamilton or:  [facility]: Grief/Bereavment counseling [phone]) [phone]      Lucretia Kern.

## 2014-06-19 NOTE — Patient Instructions (Signed)
BEFORE YOU LEAVE: -follow up in 1 month  Paxil 20mg  (one tablet daily). Do not stop this medication suddenly.   Please call to set up counseling with Dr. Glennon Hamilton or:  [facility]: Grief/Bereavment counseling [phone]

## 2014-06-19 NOTE — Progress Notes (Signed)
Pre visit review using our clinic review tool, if applicable. No additional management support is needed unless otherwise documented below in the visit note. 

## 2014-07-17 ENCOUNTER — Ambulatory Visit: Payer: BLUE CROSS/BLUE SHIELD | Admitting: Family Medicine

## 2014-08-28 ENCOUNTER — Ambulatory Visit (INDEPENDENT_AMBULATORY_CARE_PROVIDER_SITE_OTHER): Payer: BLUE CROSS/BLUE SHIELD | Admitting: Neurology

## 2014-08-28 ENCOUNTER — Encounter: Payer: Self-pay | Admitting: Neurology

## 2014-08-28 VITALS — BP 138/70 | HR 64 | Resp 18 | Ht 61.0 in | Wt 144.2 lb

## 2014-08-28 DIAGNOSIS — K146 Glossodynia: Secondary | ICD-10-CM | POA: Diagnosis not present

## 2014-08-28 MED ORDER — GABAPENTIN 300 MG PO CAPS: 600.0000 mg | ORAL_CAPSULE | Freq: Three times a day (TID) | ORAL | Status: DC

## 2014-08-28 NOTE — Progress Notes (Signed)
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   Pain is well-controlled.  HISTORY: She began noticing symptoms last year. The symptoms correlate soon after the dental procedure involving 2 canals of 2 of her right-sided upper teeth. She did see the dentist for this, who said that her symptoms are unlikely related.  She notes a "discomfort "on the left side of her face, involving the maxilla to the temple. It is not really a pain. However, she will occasionally have a brief shooting sensation, about a 5/10.  It is sensitive to the touch.  It appears to be sensitive when she is putting on her makeup. She is never in extreme pain however. She also has a burning sensation of her tongue. This also is a 5/10 in intensity. It is less painful when she is eating or drinking.  Lidocaine mouth rinse and ice provide brief relief  PAST MEDICAL HISTORY: Past Medical History  Diagnosis Date  . Candidiasis of mouth   . Anxiety   . Benign hypertension   . GERD (gastroesophageal reflux disease)   . Hematochezia 02/13/2012  . Left sided abdominal pain 02/13/2012  . Fibromyalgia   . 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 Osteoporosis, unspecified 07/30/2012  . Depression and Anxiety - 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 07/16/2012    MEDICATIONS: Current Outpatient Prescriptions on File Prior to Visit  Medication Sig Dispense Refill  . CALCIUM PO Take by mouth.    . Cholecalciferol (VITAMIN D3) 2000 UNITS TABS Take 1 tablet by mouth daily.    . hydrochlorothiazide (HYDRODIURIL) 25 MG tablet Take 1 tablet (25 mg total) by mouth daily. 90 tablet 3  . Krill Oil 300 MG CAPS Take 1 capsule by mouth daily.    . magnesium 30 MG tablet  Take 30 mg by mouth 2 (two) times daily.    . Multiple Vitamin (MULTIVITAMIN) tablet Take 1 tablet by mouth daily.    . zoledronic acid (RECLAST) 5 MG/100ML SOLN injection Inject 100 mLs (5 mg total) into the vein once. 100 mL 0  . DULoxetine (CYMBALTA) 20 MG capsule Take 1 capsule (20 mg total) by mouth daily. (Patient not taking: Reported on 08/28/2014) 30 capsule 1  . PARoxetine (PAXIL) 20 MG tablet Take 1 tablet (20 mg total) by mouth daily. (Patient not taking: Reported on 08/28/2014) 30 tablet 3   No current facility-administered medications on file prior to visit.    ALLERGIES: Allergies  Allergen Reactions  . Vicodin [Hydrocodone-Acetaminophen] Itching    FAMILY HISTORY: Family History  Problem Relation Age of Onset  . Hypertension Father   . Lung cancer Father   . Liver cancer Father   . Lung cancer Mother   . Hypertension Sister   . Osteoporosis Sister   . Hypertension Brother   . Arthritis Brother   . Diabetes Neg Hx   . Epilepsy Father     SOCIAL HISTORY: Social History   Social History  . Marital Status: Married    Spouse Name: N/A  . Number of Children: 2  . 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 Smoking status: Never Smoker   . Smokeless tobacco: Never Used  .  Alcohol Use: No  . Drug Use: No  . Sexual Activity: No   Other Topics Concern  . Not on file   Social History Narrative   Work or School: [employer]      Home Situation: lives with husband      Spiritual Beliefs: Baptist      Lifestyle: no regular exercise; diet is ok                REVIEW OF SYSTEMS: Constitutional: No fevers, chills, or sweats, no generalized fatigue, change in appetite Eyes: No visual changes, double vision, eye pain Ear, nose and throat: No hearing loss, ear pain, nasal congestion, sore throat Cardiovascular: No chest pain, palpitations Respiratory:  No shortness of breath at rest or with exertion,  wheezes GastrointestinaI: No nausea, vomiting, diarrhea, abdominal pain, fecal incontinence Genitourinary:  No dysuria, urinary retention or frequency Musculoskeletal:  No neck pain, back pain Integumentary: No rash, pruritus, skin lesions Neurological: as above Psychiatric: Depression Endocrine: No palpitations, fatigue, diaphoresis, mood swings, change in appetite, change in weight, increased thirst Hematologic/Lymphatic:  No anemia, purpura, petechiae. Allergic/Immunologic: no itchy/runny eyes, nasal congestion, recent allergic reactions, rashes  PHYSICAL EXAM: Filed Vitals:   08/28/14 0911  BP: 138/70  Pulse: 64  Resp: 18   General: No acute distress.  Patient appears well-groomed. Head:  Normocephalic/atraumatic Eyes:  Fundoscopic exam unremarkable without vessel changes, exudates, hemorrhages or papilledema. Neck: supple, no paraspinal tenderness, full range of motion Heart:  Regular rate and rhythm Lungs:  Clear to auscultation bilaterally Back: No paraspinal tenderness Neurological Exam: alert and oriented to person, place, and time. Attention span and concentration intact, recent and remote memory intact, fund of knowledge intact.  Speech fluent and not dysarthric, language intact.  CN II-XII intact. Fundoscopic exam unremarkable without vessel changes, exudates, hemorrhages or papilledema.  Bulk and tone normal, muscle strength 5/5 throughout.  Sensation to light touch, temperature and vibration intact.  Deep tendon reflexes 2+ throughout, toes downgoing.  Finger to nose and heel to shin testing intact.  Gait normal, Romberg negative.  IMPRESSION: Burning mouth syndrome  PLAN: Continue gabapentin 600mg  three times daily.  I year of refills prescribed. Follow up in one year  15 minutes spent face to face with patient, over 50% spent discussing management.  Metta Clines, DO  CC:  Colin Benton, DO

## 2014-08-28 NOTE — Patient Instructions (Signed)
Continue gabapentin 300mg  capsules.  Take 2 capsules (600mg ) three times daily Follow up one year.

## 2014-11-12 ENCOUNTER — Other Ambulatory Visit: Payer: Self-pay

## 2014-11-12 DIAGNOSIS — Z1231 Encounter for screening mammogram for malignant neoplasm of breast: Secondary | ICD-10-CM

## 2014-11-30 ENCOUNTER — Ambulatory Visit
Admission: RE | Admit: 2014-11-30 | Discharge: 2014-11-30 | Disposition: A | Discharge status: Home / Self Care | Payer: BLUE CROSS/BLUE SHIELD | Source: Ambulatory Visit

## 2014-11-30 DIAGNOSIS — Z1231 Encounter for screening mammogram for malignant neoplasm of breast: Secondary | ICD-10-CM

## 2015-02-17 ENCOUNTER — Encounter: Payer: Self-pay | Admitting: Neurology

## 2015-03-09 ENCOUNTER — Telehealth: Payer: Self-pay | Admitting: Family Medicine

## 2015-03-09 DIAGNOSIS — M542 Cervicalgia: Secondary | ICD-10-CM

## 2015-03-09 MED ORDER — HYDROCHLOROTHIAZIDE 25 MG PO TABS: 25.0000 mg | ORAL_TABLET | Freq: Every day | ORAL | Status: DC

## 2015-03-09 NOTE — Telephone Encounter (Signed)
Rx done. 

## 2015-03-09 NOTE — Telephone Encounter (Signed)
Pt states she called pharmacy last week ,but I do not see a record of this rx. Pt is now out of meds and needs refill hydrochlorothiazide (HYDRODIURIL) 25 MG tablet  1/day  Larene Pickett /[place]  Pt has appointment 3/17

## 2015-03-26 ENCOUNTER — Encounter: Payer: Self-pay | Admitting: Family Medicine

## 2015-03-26 ENCOUNTER — Ambulatory Visit (INDEPENDENT_AMBULATORY_CARE_PROVIDER_SITE_OTHER): Payer: BLUE CROSS/BLUE SHIELD | Admitting: Family Medicine

## 2015-03-26 VITALS — BP 104/68 | HR 71 | Temp 98.4°F | Ht 61.75 in | Wt 146.7 lb

## 2015-03-26 DIAGNOSIS — Z634 Disappearance and death of family member: Secondary | ICD-10-CM

## 2015-03-26 DIAGNOSIS — I10 Essential (primary) hypertension: Secondary | ICD-10-CM

## 2015-03-26 DIAGNOSIS — M810 Age-related osteoporosis without current pathological fracture: Secondary | ICD-10-CM | POA: Diagnosis not present

## 2015-03-26 DIAGNOSIS — Z0000 Encounter for general adult medical examination without abnormal findings: Secondary | ICD-10-CM

## 2015-03-26 DIAGNOSIS — M542 Cervicalgia: Secondary | ICD-10-CM

## 2015-03-26 LAB — LIPID PANEL
CHOLESTEROL: 197 mg/dL (ref 0–200)
HDL: 71.4 mg/dL (ref >39.00)
LDL Cholesterol: 112 mg/dL — ABNORMAL HIGH (ref 0–99)
NonHDL: 126.07
TRIGLYCERIDES: 71 mg/dL (ref 0.0–149.0)
Total CHOL/HDL Ratio: 3
VLDL: 14.2 mg/dL (ref 0.0–40.0)

## 2015-03-26 LAB — BASIC METABOLIC PANEL
BUN: 10 mg/dL (ref 6–23)
CHLORIDE: 99 meq/L (ref 96–112)
CO2: 31 meq/L (ref 19–32)
CREATININE: 0.66 mg/dL (ref 0.40–1.20)
Calcium: 9.3 mg/dL (ref 8.4–10.5)
GFR: 95.39 mL/min (ref >60.00)
GLUCOSE: 80 mg/dL (ref 70–99)
Potassium: 3.4 mEq/L — ABNORMAL LOW (ref 3.5–5.1)
Sodium: 138 mEq/L (ref 135–145)

## 2015-03-26 LAB — HEMOGLOBIN A1C: HEMOGLOBIN A1C: 5.7 % (ref 4.6–6.5)

## 2015-03-26 MED ORDER — HYDROCHLOROTHIAZIDE 25 MG PO TABS: 25.0000 mg | ORAL_TABLET | Freq: Every day | ORAL | Status: DC

## 2015-03-26 NOTE — Progress Notes (Signed)
Medicare Annual Preventive Care Visit  (initial annual wellness or annual wellness exam)  Concerns and/or follow up today:  Depression: -hx of depression and anxiety treated by Dr Toy Care in the past -then off medications for some time -acute worsening with sudden passing of husband 05/27/14 of cancer dx 1 week before he died -she has good support from her children -restarted paxil at the time -she reports she took 3 pills then decided she did not need it -Reports she still feels sad some days, but feels this is normal considering the circumstances -She is able to function and declines suicidal ideation or thoughts of self-harm  Hypertension: -Continues to take her diuretic -No chest pain, shortness of breath or swelling  Osteoporosis: -He sees Dr. Dwyane Dee for this and is on recall last   ROS: negative for report of fevers, unintentional weight loss, vision changes, vision loss, hearing loss or change, chest pain, sob, hemoptysis, melena, hematochezia, hematuria, genital discharge or lesions, falls, bleeding or bruising, loc, thoughts of suicide or self harm, memory loss  1.) Patient-completed health risk assessment  - completed and reviewed, see scanned documentation  2.) Review of Medical History: -PMH, PSH, Family History and current specialty and care providers reviewed and updated and listed below  - see scanned in document in chart and below  Past Medical History  Diagnosis Date  . Candidiasis of mouth   . Anxiety   . Benign hypertension   . GERD (gastroesophageal reflux disease)   . Hematochezia 02/13/2012  . Left sided abdominal pain 02/13/2012  . Fibromyalgia   . 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 Osteoporosis, unspecified 07/30/2012  . Depression and Anxiety - 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 07/16/2012    Past Surgical History  Procedure Laterality Date  . Abdominal hysterectomy    . Foot surgery Bilateral   . Tonsillectomy    . Breast lumpectomy Right     Social  History   Social History  . Marital Status: Married    Spouse Name: N/A  . Number of Children: 2  . 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 Smoking status: Never Smoker   . Smokeless tobacco: Never Used  . Alcohol Use: No  . Drug Use: No  . Sexual Activity: No   Other Topics Concern  . Not on file   Social History Narrative   Work or School: industry for [employer]      Home Situation: lives with husband      Spiritual Beliefs: Baptist      Lifestyle: no regular exercise; diet is ok                Family History  Problem Relation Age of Onset  . Hypertension Father   . Lung cancer Father   . Liver cancer Father   . Lung cancer Mother   . Hypertension Sister   . Osteoporosis Sister   . Hypertension Brother   . Arthritis Brother   . Diabetes Neg Hx   . Epilepsy Father     Current Outpatient Prescriptions on File Prior to Visit  Medication Sig Dispense Refill  . CALCIUM PO Take by mouth.    . Cholecalciferol (VITAMIN D3) 2000 UNITS TABS Take 1 tablet by mouth daily.    Marland Kitchen gabapentin (NEURONTIN) 300 MG capsule Take 2 capsules (600 mg total) by mouth 3 (three) times daily. 180 capsule 11  . Krill Oil 300  MG CAPS Take 1 capsule by mouth daily.    . magnesium 30 MG tablet Take 30 mg by mouth 2 (two) times daily.    . Multiple Vitamin (MULTIVITAMIN) tablet Take 1 tablet by mouth daily.    . zoledronic acid (RECLAST) 5 MG/100ML SOLN injection Inject 100 mLs (5 mg total) into the vein once. 100 mL 0   No current facility-administered medications on file prior to visit.     3.) Review of functional ability and level of safety:  Any difficulty hearing?  NO  History of falling?  NO  Any trouble with IADLs - using a phone, using transportation, grocery shopping, preparing meals, doing housework, doing laundry, taking medications and managing money? NO  Advance Directives? YES  See summary of recommendations in  Patient Instructions below.  4.) Physical Exam Filed Vitals:   03/26/15 1318  BP: 104/68  Pulse: 71  Temp: 98.4 F (36.9 C)   Estimated body mass index is 27.07 kg/(m^2) as calculated from the following:   Height as of this encounter: 5' 1.75" (1.568 m).   Weight as of this encounter: 146 lb 11.2 oz (66.543 kg).  EKG (optional): deferred  General: alert, appear well hydrated and in no acute distress  HEENT: visual acuity grossly intact  CV: HRRR  Lungs: CTA bilaterally  Psych: pleasant and cooperative, no obvious depression or anxiety  Cognitive function grossly intact  See patient instructions for recommendations.  Education and counseling regarding the above review of health provided with a plan for the following: -see scanned patient completed form for further details -fall prevention strategies discussed  -healthy lifestyle discussed -importance and resources for completing advanced directives discussed -see patient instructions below for any other recommendations provided  4)The following written screening schedule of preventive measures were reviewed with assessment and plan made per below, orders and patient instructions:       Alcohol screening done     Obesity Screening and counseling done     STI screening (Hep C if born [birth year]) offered and per pt wishes not done     Tobacco Screening done        Pneumococcal (PPSV23 -one dose after 64, one before if risk factors), influenza yearly and hepatitis B vaccines (if high risk - end stage renal disease, IV drugs, homosexual men, live in home for mentally retarded, hemophilia receiving factors) ASSESSMENT/PLAN: Refuses all vaccines      Screening mammograph (yearly if >40) ASSESSMENT/PLAN: utd       Screening Pap smear/pelvic exam (q2 years) ASSESSMENT/PLAN: declined reports all normal paps in the past with last Pap in the last 5 years and declines further Pap exams or pelvic exam today.      Colorectal  cancer screening (FOBT yearly or flex sig q4y or colonoscopy q10y or barium enema q4y) ASSESSMENT/PLAN: utd       Diabetes outpatient self-management training services ASSESSMENT/PLAN: n/a      Bone mass measurements(covered q2y if indicated - estrogen def, osteoporosis, hyperparathyroid, vertebral abnormalities, osteoporosis or steroids) ASSESSMENT/PLAN: utd, these doctor tomorrow for her osteoporosis      Screening for glaucoma(q1y if high risk - diabetes, FH, AA and > 50 or hispanic and > 65) ASSESSMENT/PLAN: utd      Medical nutritional therapy for individuals with diabetes or renal disease ASSESSMENT/PLAN: see orders      Cardiovascular screening blood tests (lipids q5y) ASSESSMENT/PLAN: see orders and labs      Diabetes screening tests ASSESSMENT/PLAN: see orders and labs  7.) Summary:   Medicare annual wellness visit, initial - Plan: Hemoglobin A1c, Lipid Panel -risk factors and conditions per above assessment were discussed and treatment, recommendations and referrals were offered per documentation above and orders and patient instructions.  Essential hypertension, benign - Plan: Basic metabolic panel -Continue current medication, refills provided  Osteoporosis -Sees endocrinologist  Bereavement -Coping well, declines treatment  Patient Instructions  Before you leave: -Labs -Schedule follow-up in 4-6 months  We recommend the following healthy lifestyle measures: - eat a healthy whole foods diet consisting of regular small meals composed of vegetables, fruits, beans, nuts, seeds, healthy meats such as white chicken and fish and whole grains.  - avoid sweets, white starchy foods, fried foods, fast food, processed foods, sodas, red meet and other fattening foods.  - get a least 150-300 minutes of aerobic exercise per week.   -We have ordered labs or studies at this visit. It can take up to 1-2 weeks for results and processing. We will contact you with instructions  IF your results are abnormal. Normal results will be released to your [facility]. If you have not heard from [place] or can not find your results in [facility] in 2 weeks please contact our office.

## 2015-03-26 NOTE — Patient Instructions (Signed)
Before you leave: -Labs -Schedule follow-up in 4-6 months  We recommend the following healthy lifestyle measures: - eat a healthy whole foods diet consisting of regular small meals composed of vegetables, fruits, beans, nuts, seeds, healthy meats such as white chicken and fish and whole grains.  - avoid sweets, white starchy foods, fried foods, fast food, processed foods, sodas, red meet and other fattening foods.  - get a least 150-300 minutes of aerobic exercise per week.   -We have ordered labs or studies at this visit. It can take up to 1-2 weeks for results and processing. We will contact you with instructions IF your results are abnormal. Normal results will be released to your [facility]. If you have not heard from [place] or can not find your results in [facility] in 2 weeks please contact our office.

## 2015-03-26 NOTE — Progress Notes (Signed)
Pre visit review using our clinic review tool, if applicable. No additional management support is needed unless otherwise documented below in the visit note. 

## 2015-04-15 DIAGNOSIS — B999 Unspecified infectious disease: Secondary | ICD-10-CM | POA: Diagnosis not present

## 2015-04-19 ENCOUNTER — Other Ambulatory Visit: Payer: Self-pay | Admitting: *Deleted

## 2015-04-19 ENCOUNTER — Other Ambulatory Visit (INDEPENDENT_AMBULATORY_CARE_PROVIDER_SITE_OTHER): Payer: BLUE CROSS/BLUE SHIELD

## 2015-04-19 DIAGNOSIS — M810 Age-related osteoporosis without current pathological fracture: Secondary | ICD-10-CM | POA: Diagnosis not present

## 2015-04-19 LAB — BASIC METABOLIC PANEL
BUN: 8 mg/dL (ref 6–23)
CHLORIDE: 98 meq/L (ref 96–112)
CO2: 31 meq/L (ref 19–32)
CREATININE: 0.7 mg/dL (ref 0.40–1.20)
Calcium: 9.2 mg/dL (ref 8.4–10.5)
GFR: 89.11 mL/min (ref >60.00)
Glucose, Bld: 85 mg/dL (ref 70–99)
POTASSIUM: 3.8 meq/L (ref 3.5–5.1)
Sodium: 137 mEq/L (ref 135–145)

## 2015-04-19 LAB — VITAMIN D 25 HYDROXY (VIT D DEFICIENCY, FRACTURES): VITD: 36.31 ng/mL (ref 30.00–100.00)

## 2015-04-22 ENCOUNTER — Ambulatory Visit (INDEPENDENT_AMBULATORY_CARE_PROVIDER_SITE_OTHER): Payer: Medicare Other | Admitting: Endocrinology

## 2015-04-22 ENCOUNTER — Encounter: Payer: Self-pay | Admitting: Endocrinology

## 2015-04-22 VITALS — BP 100/64 | HR 58 | Temp 98.4°F | Resp 14 | Ht 61.75 in | Wt 149.2 lb

## 2015-04-22 DIAGNOSIS — M810 Age-related osteoporosis without current pathological fracture: Secondary | ICD-10-CM

## 2015-04-22 NOTE — Progress Notes (Signed)
Patient ID: Dawn Reyes, female   DOB: [birth year], 65 y.o.   MRN: [id number]   History of Present Illness:  OSTEOPOROSIS:   Background history includes menopause at the usual age of about 12+.   She was told by her gynecologist in 2006 that she had osteopenia on screening and bone density.  She  had a T score of -2.0 at the right right neck of femur but her spine was normal At some point the patient was given a trial of Fosamax which she took for about 2 years but stopped it because of difficulty with lower chest and upper abdominal discomfort when she would take this She also tried this again in 2014 but could not tolerate it for longer than 6 months  Recent history: In 2011 she was found to have a T score of -2.5 at the hip and  subsequently this was -2.7 at the left hip in 2014 She was given Atelvia in 2014 and she took this for 3-4 months but since she could not get a refill on this he did not continue it and is here for further management She thinks she has lost only about a half an inch in height since her youth  May have lost 0.25 inches since her last visit  No significant back pain and no recent history of fracture She also has been taking variable doses of vitamin D for the last few years and also calcium twice daily She was given Reclast in 7/15 but did not follow-up for repeat injection last year    Past Medical History  Diagnosis Date  . Candidiasis of mouth   . Anxiety   . Benign hypertension   . GERD (gastroesophageal reflux disease)   . Hematochezia 02/13/2012  . Left sided abdominal pain 02/13/2012  . Fibromyalgia   . 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 Osteoporosis, unspecified 07/30/2012  . Depression and Anxiety - 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 07/16/2012    Past Surgical History  Procedure Laterality Date  . Abdominal hysterectomy    . Foot surgery Bilateral   . Tonsillectomy    . Breast lumpectomy Right     Family History  Problem  Relation Age of Onset  . Hypertension Father   . Lung cancer Father   . Liver cancer Father   . Lung cancer Mother   . Hypertension Sister   . Osteoporosis Sister   . Hypertension Brother   . Arthritis Brother   . Diabetes Neg Hx   . Epilepsy Father     Social History:  reports that she has never smoked. She has never used smokeless tobacco. She reports that she does not drink alcohol or use illicit drugs.  Allergies:  Allergies  Allergen Reactions  . Vicodin [Hydrocodone-Acetaminophen] Itching      Medication List       This list is accurate as of: 04/22/15 10:57 AM.  Always use your most recent med list.               CALCIUM PO  Take by mouth.     gabapentin 300 MG capsule  Commonly known as:  NEURONTIN  Take 2 capsules (600 mg total) by mouth 3 (three) times daily.     hydrochlorothiazide 25 MG tablet  Commonly known as:  HYDRODIURIL  Take 1 tablet (25 mg total) by mouth daily.     Krill Oil 300 MG Caps  Take 1 capsule by mouth daily.  magnesium 30 MG tablet  Take 30 mg by mouth 2 (two) times daily.     multivitamin tablet  Take 1 tablet by mouth daily.     Vitamin D3 2000 units Tabs  Take 1 tablet by mouth daily.     zoledronic acid 5 MG/100ML Soln injection  Commonly known as:  RECLAST  Inject 100 mLs (5 mg total) into the vein once.         REVIEW OF SYSTEMS:         She recently had a wellness exam with her PCP  Taking HCTZ for hypertension, electrolytes normal      EXAM:  BP 100/64 mmHg  Pulse 58  Temp(Src) 98.4 F (36.9 C)  Resp 14  Ht 5' 1.75" (1.568 m)  Wt 149 lb 3.2 oz (67.677 kg)  BMI 27.53 kg/m2  SpO2 98%   ASSESSMENT:   History of osteoporosis/osteopenia , long-standing and clinically asymptomatic She has mild osteoporosis at the left neck of the femur with T score -2.7 in 2014  She missed her dose of Reclast last year, previously had no side effects with this  Clinically she is doing well Vitamin D level was  adequate recently   PLAN:   Needs to have repeat dose of Reclast and will schedule this Bone density in about 6 months  Doyt Castellana 04/22/2015, 10:57 AM

## 2015-04-29 ENCOUNTER — Telehealth: Payer: Self-pay | Admitting: Endocrinology

## 2015-04-29 NOTE — Telephone Encounter (Signed)
PT was calling about her Reclast Injection and was wanting to set that up, she has not had her Bone Density done yet, so she wanted to be advised as to the next step to get this Injection appointment scheduled.

## 2015-04-29 NOTE — Telephone Encounter (Signed)
Vaughan Basta needs to schedule the Reclast.  Bone density will be in 6 months

## 2015-04-29 NOTE — Telephone Encounter (Signed)
Vaughan Basta, could you please call to schedule.

## 2015-04-29 NOTE — Telephone Encounter (Signed)
Please see below and advise,

## 2015-05-03 NOTE — Telephone Encounter (Signed)
Scheduled for this Wednesday.

## 2015-05-05 ENCOUNTER — Ambulatory Visit (INDEPENDENT_AMBULATORY_CARE_PROVIDER_SITE_OTHER): Payer: Medicare Other | Admitting: Endocrinology

## 2015-05-05 DIAGNOSIS — M810 Age-related osteoporosis without current pathological fracture: Secondary | ICD-10-CM

## 2015-05-14 DIAGNOSIS — D239 Other benign neoplasm of skin, unspecified: Secondary | ICD-10-CM | POA: Diagnosis not present

## 2015-05-14 DIAGNOSIS — D2361 Other benign neoplasm of skin of right upper limb, including shoulder: Secondary | ICD-10-CM | POA: Diagnosis not present

## 2015-05-17 DIAGNOSIS — L905 Scar conditions and fibrosis of skin: Secondary | ICD-10-CM | POA: Diagnosis not present

## 2015-05-19 NOTE — Patient Instructions (Signed)
Continue taking Vit. D and calcium per Dr. Ronnie Derby orders Drink large amounts of water today.

## 2015-05-19 NOTE — Progress Notes (Deleted)
Subjective:     Patient ID: Dawn Reyes, female   DOB: [birth year], 65 y.o.   MRN: [id number]  HPI   Review of Systems     Objective:   Physical Exam     Assessment:     ***    Plan:     ***

## 2015-05-19 NOTE — Progress Notes (Signed)
Per Dr. Ronnie Derby order on 04/22/15 and the patient's written consent, and IV of Normal saline was started in the patients' left arm.  After 5 min., 5mg /159ml of zolendronic acid was infused, starting at 4:30 for 25 min.  After flushing with Normal Saline, the IV was DC and the site showed no signs of redness or swelling.  She denied any nausea or light headedness.  She was encouraged to drink large amounts of water today, and continue taking Calcium and Vit. D per Dr. Ronnie Derby orders.  She agreed to do this.

## 2015-08-12 ENCOUNTER — Ambulatory Visit (INDEPENDENT_AMBULATORY_CARE_PROVIDER_SITE_OTHER): Payer: Medicare Other | Admitting: Family Medicine

## 2015-08-12 ENCOUNTER — Encounter: Payer: Self-pay | Admitting: Family Medicine

## 2015-08-12 VITALS — BP 118/80 | HR 81 | Temp 98.5°F | Ht 61.75 in | Wt 144.5 lb

## 2015-08-12 DIAGNOSIS — IMO0002 Reserved for concepts with insufficient information to code with codable children: Secondary | ICD-10-CM

## 2015-08-12 DIAGNOSIS — H8112 Benign paroxysmal vertigo, left ear: Secondary | ICD-10-CM | POA: Diagnosis not present

## 2015-08-12 NOTE — Patient Instructions (Signed)
Benign Positional Vertigo  -We placed a referral for you as discussed. It usually takes about 1-2 weeks to process and schedule this referral. If you have not heard from [place] regarding this appointment in 2 weeks please contact our office.  -follow up here if symptoms are worsening, new symptoms develop or symptoms do not resolve with treatment.  Vertigo is the feeling that you or your surroundings are moving when they are not. Benign positional vertigo is the most common form of vertigo. The cause of this condition is not serious (is benign). This condition is triggered by certain movements and positions (is positional). This condition can be dangerous if it occurs while you are doing something that could endanger you or others, such as driving.  CAUSES In many cases, the cause of this condition is not known. It may be caused by a disturbance in an area of the inner ear that helps your brain to sense movement and balance. This disturbance can be caused by a viral infection (labyrinthitis), head injury, or repetitive motion. RISK FACTORS This condition is more likely to develop in:  Women.  People who are 86 years of age or older. SYMPTOMS Symptoms of this condition usually happen when you move your head or your eyes in different directions. Symptoms may start suddenly, and they usually last for less than a minute. Symptoms may include:  Loss of balance and falling.  Feeling like you are spinning or moving.  Feeling like your surroundings are spinning or moving.  Nausea and vomiting.  Blurred vision.  Dizziness.  Involuntary eye movement (nystagmus). Symptoms can be mild and cause only slight annoyance, or they can be severe and interfere with daily life. Episodes of benign positional vertigo may return (recur) over time, and they may be triggered by certain movements. Symptoms may improve over time. DIAGNOSIS This condition is usually diagnosed by medical history and a physical exam  of the head, neck, and ears. You may be referred to a health care provider who specializes in ear, nose, and throat (ENT) problems (otolaryngologist) or a provider who specializes in disorders of the nervous system (neurologist). You may have additional testing, including:  MRI.  A CT scan.  Eye movement tests. Your health care provider may ask you to change positions quickly while he or she watches you for symptoms of benign positional vertigo, such as nystagmus. Eye movement may be tested with an electronystagmogram (ENG), caloric stimulation, the Dix-Hallpike test, or the roll test.  An electroencephalogram (EEG). This records electrical activity in your brain.  Hearing tests. TREATMENT Usually, your health care provider will treat this by moving your head in specific positions to adjust your inner ear back to normal. Surgery may be needed in severe cases, but this is rare. In some cases, benign positional vertigo may resolve on its own in 2-4 weeks. HOME CARE INSTRUCTIONS Safety  Move slowly.Avoid sudden body or head movements.  Avoid driving.  Avoid operating heavy machinery.  Avoid doing any tasks that would be dangerous to you or others if a vertigo episode would occur.  If you have trouble walking or keeping your balance, try using a cane for stability. If you feel dizzy or unstable, sit down right away.  Return to your normal activities as told by your health care provider. Ask your health care provider what activities are safe for you. General Instructions  Take over-the-counter and prescription medicines only as told by your health care provider.  Avoid certain positions or movements as  told by your health care provider.  Drink enough fluid to keep your urine clear or pale yellow.  Keep all follow-up visits as told by your health care provider. This is important. SEEK MEDICAL CARE IF:  You have a fever.  Your condition gets worse or you develop new  symptoms.  Your family or friends notice any behavioral changes.  Your nausea or vomiting gets worse.  You have numbness or a "pins and needles" sensation. SEEK IMMEDIATE MEDICAL CARE IF:  You have difficulty speaking or moving.  You are always dizzy.  You faint.  You develop severe headaches.  You have weakness in your legs or arms.  You have changes in your hearing or vision.  You develop a stiff neck.  You develop sensitivity to light.   This information is not intended to replace advice given to you by your health care provider. Make sure you discuss any questions you have with your health care provider.   Document Released: 10/03/2005 Document Revised: 09/16/2014 Document Reviewed: 04/20/2014 Elsevier Interactive Patient Education [employer].

## 2015-08-12 NOTE — Progress Notes (Signed)
Pre visit review using our clinic review tool, if applicable. No additional management support is needed unless otherwise documented below in the visit note. 

## 2015-08-12 NOTE — Progress Notes (Signed)
HPI:  Acute visit for:  Vertigo: -started 2 weeks ago -symptoms: brief spells of vertigo with nausea triggered by certain head movements -denies: fevers, HA, malaise, SOB, CP, weakness, numbness, sinus issues, hearing or speech issues -sees optho for glasses and her glasses broke so vision poorer w/out has opth appt today to get new glasses  ROS: See pertinent positives and negatives per HPI.  Past Medical History:  Diagnosis Date  . Anxiety   . Benign hypertension   . 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 Candidiasis of mouth   . 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 Fibromyalgia   . GERD (gastroesophageal reflux disease)   . Hematochezia 02/13/2012  . Left sided abdominal pain 02/13/2012  . Osteoporosis, unspecified 07/30/2012    Past Surgical History:  Procedure Laterality Date  . ABDOMINAL HYSTERECTOMY    . BREAST LUMPECTOMY Right   . FOOT SURGERY Bilateral   . TONSILLECTOMY      Family History  Problem Relation Age of Onset  . Hypertension Father   . Lung cancer Father   . Liver cancer Father   . Lung cancer Mother   . Hypertension Sister   . Osteoporosis Sister   . Hypertension Brother   . Arthritis Brother   . Diabetes Neg Hx   . Epilepsy Father     Social History   Social History  . Marital status: Married    Spouse name: N/A  . Number of children: 2  . Years of education: N/A   Occupational History  . [place] History Main Topics  . Smoking status: Never Smoker  . Smokeless tobacco: Never Used  . Alcohol use No  . Drug use: No  . Sexual activity: No   Other Topics Concern  . None   Social History Narrative   Work or School: [employer]      Home Situation: lives with husband      Spiritual Beliefs: [employer]      Lifestyle: no regular exercise; diet is ok                 Current Outpatient Prescriptions:  .  CALCIUM PO, Take by mouth., Disp: , Rfl:  .   Cholecalciferol (VITAMIN D3) 2000 UNITS TABS, Take 1 tablet by mouth daily., Disp: , Rfl:  .  gabapentin (NEURONTIN) 300 MG capsule, Take 2 capsules (600 mg total) by mouth 3 (three) times daily., Disp: 180 capsule, Rfl: 11 .  hydrochlorothiazide (HYDRODIURIL) 25 MG tablet, Take 1 tablet (25 mg total) by mouth daily., Disp: 90 tablet, Rfl: 3 .  Krill Oil 300 MG CAPS, Take 1 capsule by mouth daily., Disp: , Rfl:  .  magnesium 30 MG tablet, Take 30 mg by mouth 2 (two) times daily., Disp: , Rfl:  .  Multiple Vitamin (MULTIVITAMIN) tablet, Take 1 tablet by mouth daily., Disp: , Rfl:  .  zoledronic acid (RECLAST) 5 MG/100ML SOLN injection, Inject 100 mLs (5 mg total) into the vein once., Disp: 100 mL, Rfl: 0  EXAM:  Vitals:   08/12/15 1300  BP: 118/80  Pulse: 81  Temp: 98.5 F (36.9 C)    Body mass index is 26.64 kg/m.  GENERAL: vitals reviewed and listed above, alert, oriented, appears well hydrated and in no acute distress  HEENT: atraumatic, conjunttiva clear, no obvious abnormalities on inspection of external nose and ears, normal appearance ear canals and TMS, normal appearance  nasal mucosa and oropharynx  NECK: no obvious masses on inspection, no bruit  LUNGS: clear to auscultation bilaterally, no wheezes, rales or rhonchi, good air movement  CV: HRRR, no peripheral edema  MS: moves all extremities without noticeable abnormality  PSYCH/PSYCH: pleasant and cooperative, no obvious depression or anxiety, CN II-XII grossly intact, finger to nose normal, speech and thought processing grossly intact, dix hallpike + to L  ASSESSMENT AND PLAN:  Discussed the following assessment and plan:  Positional vertigo, left - Plan: Ambulatory referral to Physical Therapy  -we discussed possible serious and likely etiologies, workup and treatment, treatment risks and return precautions -after this discussion, Dawn Reyes opted for vestibular rehab, meclizine prn -advised to not drive with  vertigo -follow up advised as needed -of course, we advised Dawn Reyes  to return or notify a doctor immediately if symptoms worsen or persist or new concerns arise.   Patient Instructions  Benign Positional Vertigo  -We placed a referral for you as discussed. It usually takes about 1-2 weeks to process and schedule this referral. If you have not heard from [place] regarding this appointment in 2 weeks please contact our office.  -follow up here if symptoms are worsening, new symptoms develop or symptoms do not resolve with treatment.  Vertigo is the feeling that you or your surroundings are moving when they are not. Benign positional vertigo is the most common form of vertigo. The cause of this condition is not serious (is benign). This condition is triggered by certain movements and positions (is positional). This condition can be dangerous if it occurs while you are doing something that could endanger you or others, such as driving.  CAUSES In many cases, the cause of this condition is not known. It may be caused by a disturbance in an area of the inner ear that helps your brain to sense movement and balance. This disturbance can be caused by a viral infection (labyrinthitis), head injury, or repetitive motion. RISK FACTORS This condition is more likely to develop in:  Women.  People who are 65 years of age or older. SYMPTOMS Symptoms of this condition usually happen when you move your head or your eyes in different directions. Symptoms may start suddenly, and they usually last for less than a minute. Symptoms may include:  Loss of balance and falling.  Feeling like you are spinning or moving.  Feeling like your surroundings are spinning or moving.  Nausea and vomiting.  Blurred vision.  Dizziness.  Involuntary eye movement (nystagmus). Symptoms can be mild and cause only slight annoyance, or they can be severe and interfere with daily life. Episodes of benign positional vertigo may  return (recur) over time, and they may be triggered by certain movements. Symptoms may improve over time. DIAGNOSIS This condition is usually diagnosed by medical history and a physical exam of the head, neck, and ears. You may be referred to a health care provider who specializes in ear, nose, and throat (ENT) problems (otolaryngologist) or a provider who specializes in disorders of the nervous system (neurologist). You may have additional testing, including:  MRI.  A CT scan.  Eye movement tests. Your health care provider may ask you to change positions quickly while he or she watches you for symptoms of benign positional vertigo, such as nystagmus. Eye movement may be tested with an electronystagmogram (ENG), caloric stimulation, the Dix-Hallpike test, or the roll test.  An electroencephalogram (EEG). This records electrical activity in your brain.  Hearing tests. TREATMENT Usually, your health  care provider will treat this by moving your head in specific positions to adjust your inner ear back to normal. Surgery may be needed in severe cases, but this is rare. In some cases, benign positional vertigo may resolve on its own in 2-4 weeks. HOME CARE INSTRUCTIONS Safety  Move slowly.Avoid sudden body or head movements.  Avoid driving.  Avoid operating heavy machinery.  Avoid doing any tasks that would be dangerous to you or others if a vertigo episode would occur.  If you have trouble walking or keeping your balance, try using a cane for stability. If you feel dizzy or unstable, sit down right away.  Return to your normal activities as told by your health care provider. Ask your health care provider what activities are safe for you. General Instructions  Take over-the-counter and prescription medicines only as told by your health care provider.  Avoid certain positions or movements as told by your health care provider.  Drink enough fluid to keep your urine clear or pale  yellow.  Keep all follow-up visits as told by your health care provider. This is important. SEEK MEDICAL CARE IF:  You have a fever.  Your condition gets worse or you develop new symptoms.  Your family or friends notice any behavioral changes.  Your nausea or vomiting gets worse.  You have numbness or a "pins and needles" sensation. SEEK IMMEDIATE MEDICAL CARE IF:  You have difficulty speaking or moving.  You are always dizzy.  You faint.  You develop severe headaches.  You have weakness in your legs or arms.  You have changes in your hearing or vision.  You develop a stiff neck.  You develop sensitivity to light.   This information is not intended to replace advice given to you by your health care provider. Make sure you discuss any questions you have with your health care provider.   Document Released: 10/03/2005 Document Revised: 09/16/2014 Document Reviewed: 04/20/2014 Elsevier Interactive Patient Education 2016 [place]., DO

## 2015-08-19 ENCOUNTER — Ambulatory Visit: Payer: Medicare Other | Attending: Family Medicine

## 2015-08-19 DIAGNOSIS — R42 Dizziness and giddiness: Secondary | ICD-10-CM | POA: Diagnosis not present

## 2015-08-19 NOTE — Therapy (Signed)
[facility] [address] [place], [place], [address] Phone: [phone]   Fax:  [phone]  Physical Therapy Evaluation  Patient Details  Name: Dawn Reyes MRN: [id number] Date of Birth: July 18, 1950 Referring Provider: Dr. Maudie Mercury  Encounter Date: 08/19/2015      PT End of Session - 08/19/15 0936    Visit Number 1   Number of Visits 1   Date for PT Re-Evaluation 08/19/15  eval only   Authorization Type G-CODE every 10th visit, MEDICARE   PT Start Time 0847   PT Stop Time 0921   PT Time Calculation (min) 34 min   Equipment Utilized During Treatment --  min guard prn   Activity Tolerance Patient tolerated treatment well   Behavior During Therapy [facility] for tasks assessed/performed      Past Medical History:  Diagnosis Date  . Anxiety   . Benign hypertension   . 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 Candidiasis of mouth   . 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 Fibromyalgia   . GERD (gastroesophageal reflux disease)   . Hematochezia 02/13/2012  . Left sided abdominal pain 02/13/2012  . Osteoporosis, unspecified 07/30/2012    Past Surgical History:  Procedure Laterality Date  . ABDOMINAL HYSTERECTOMY    . BREAST LUMPECTOMY Right   . FOOT SURGERY Bilateral   . TONSILLECTOMY      There were no vitals filed for this visit.       Subjective Assessment - 08/19/15 0852    Subjective Pt reported dizziness began about 2 weeks ago. Pt states dizziness is worse when looking up and down, nausea is also present.  Pt reports frequency was daily until it was gone yesterday. Pt reports dizziness at worst 9/10 and at best: 0/10. Pt describes dizziness as spinning, especially when lying down and wooziness/nauseous after spinning. Pt reports spinning lasts about 1 minute and then wooziness. Pt denied N/T, weakness or neck pain.    Pertinent History osteoporosis, LLE neuralgia, anxiety and  depression, benign HTN   Patient Stated Goals "To stop being dizzy."   Currently in Pain? No/denies            [facility] PT Assessment - 08/19/15 0856      Assessment   Medical Diagnosis L positional vertigo   Referring Provider Dr. Maudie Mercury   Onset Date/Surgical Date 08/05/15   Prior Therapy none     Precautions   Precautions None     Restrictions   Weight Bearing Restrictions No     Balance Screen   Has the patient fallen in the past 6 months No   Has the patient had a decrease in activity level because of a fear of falling?  No   Is the patient reluctant to leave their home because of a fear of falling?  No     Home Environment   Living Environment Private residence   Living Arrangements Children   Available Help at Discharge Family   Type of [place] None     Prior Function   Level of [place] Retired   Leisure Pt likes to baby-sit great grandkids and travel to [place] to visit her sister     Cognition   Overall Cognitive Status Within Functional Limits for tasks assessed     Ambulation/Gait   Ambulation/Gait Yes  Ambulation/Gait Assistance 7: Independent  when amb. in straight line and no head turns   Ambulation Distance (Feet) 100 Feet   Assistive device None   Gait Pattern Within Functional Limits;Step-through pattern   Ambulation Surface Level;Indoor   Gait velocity 3.60ft/sec.     Functional Gait  Assessment   Gait assessed  Yes   Gait Level Surface Walks 20 ft in less than 5.5 sec, no assistive devices, good speed, no evidence for imbalance, normal gait pattern, deviates no more than 6 in outside of the 12 in walkway width.   Change in Gait Speed Able to smoothly change walking speed without loss of balance or gait deviation. Deviate no more than 6 in outside of the 12 in walkway width.   Gait with Horizontal Head Turns Performs head turns smoothly with  no change in gait. Deviates no more than 6 in outside 12 in walkway width   Gait with Vertical Head Turns Performs head turns with no change in gait. Deviates no more than 6 in outside 12 in walkway width.   Gait and Pivot Turn Pivot turns safely within 3 sec and stops quickly with no loss of balance.   Step Over Obstacle Is able to step over 2 stacked shoe boxes taped together (9 in total height) without changing gait speed. No evidence of imbalance.   Gait with Narrow Base of Support Is able to ambulate for 10 steps heel to toe with no staggering.   Gait with Eyes Closed Walks 20 ft, uses assistive device, slower speed, mild gait deviations, deviates 6-10 in outside 12 in walkway width. Ambulates 20 ft in less than 9 sec but greater than 7 sec.   Ambulating Backwards Walks 20 ft, no assistive devices, good speed, no evidence for imbalance, normal gait   Steps Alternating feet, no rail.   Total Score 29      DHI: 34%-dizziness had moderate impact on functional activities when present but pt reported she is no longer experiencing dizziness.      Vestibular Assessment - 08/19/15 0902      Symptom Behavior   Type of Dizziness Spinning  and wooziness   Frequency of Dizziness daily   Duration of Dizziness less than one minute and then woozy   Aggravating Factors Lying supine;Looking up to the ceiling  looking down   Relieving Factors Head stationary;Rest     Occulomotor Exam   Occulomotor Alignment Normal   Spontaneous Absent   Gaze-induced Absent   Smooth Pursuits Intact   Saccades Intact     Vestibulo-Occular Reflex   VOR 1 Head Only (x 1 viewing) WFL and no c/o dizziness.     Positional Testing   Dix-Hallpike Dix-Hallpike Right;Dix-Hallpike Left   Horizontal Canal Testing Horizontal Canal Right;Horizontal Canal Left     Dix-Hallpike Right   Dix-Hallpike Right Duration none   Dix-Hallpike Right Symptoms No nystagmus     Dix-Hallpike Left   Dix-Hallpike Left Duration none    Dix-Hallpike Left Symptoms No nystagmus     Horizontal Canal Right   Horizontal Canal Right Duration none   Horizontal Canal Right Symptoms Normal     Horizontal Canal Left   Horizontal Canal Left Duration none   Horizontal Canal Left Symptoms Normal                       PT Education - 08/19/15 0935    Education provided Yes   Education Details PT discussed exam findings and outcome  measures. PT discussed negative exam for BPPV and exam WFL, therefore, pt does not require PT at this time, as it appears vertigo has resolved. PT educated pt that she would need a new referral if vertigo does return.    Person(s) Educated Patient   Methods Explanation   Comprehension Verbalized understanding          PT Short Term Goals - 08/19/15 0940      PT SHORT TERM GOAL #1   Title eval only           PT Long Term Goals - 08/19/15 0940      PT LONG TERM GOAL #1   Title eval only               Plan - 08/19/15 0937    Clinical Impression Statement Pt is a pleasant 64y/o female presenting to OPPT neuro for vertigo. Pt's PMH significant for the following: osteoporosis, LLE neuralgia, anxiety and depression, benign HTN. Pt's exam negative for BPPV sx's or nystagmus. Pt's gait speed and FGA WNL and indicate pt is not at risk for falls. Pt reported sx's have been gone for two days. Therefore, pt does not require PT at this time. Pt is aware she will require a new referral if vertigo re-occurs. Thank you for this referral.    Rehab Potential Excellent   Clinical Impairments Affecting Rehab Potential co-morbidities   PT Frequency One time visit   PT Treatment/Interventions --  none required   PT Next Visit Plan eval only   Consulted and Agree with Plan of Care Patient      Patient will benefit from skilled therapeutic intervention in order to improve the following deficits and impairments:  Dizziness  Visit Diagnosis: Dizziness and giddiness - Plan: PT plan of  care cert/re-cert      G-Codes - [id number]    Functional Assessment Tool Used FGA: 29/30 and gait speed: 3.52ft/sec. no AD   Functional Limitation Mobility: Walking and moving around   Mobility: Walking and Moving Around Current Status [phone]) 0 percent impaired, limited or restricted   Mobility: Walking and Moving Around Goal Status [phone]) 0 percent impaired, limited or restricted   Mobility: Walking and Moving Around Discharge Status [phone]) 0 percent impaired, limited or restricted       Problem List Patient Active Problem List   Diagnosis Date Noted  . 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 Osteoporosis 07/30/2012  . Depression and Anxiety - 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 in the past 07/16/2012  . Essential hypertension, benign 07/16/2012  . GERD (gastroesophageal reflux disease) 07/16/2012  . Neuralgia 07/16/2012    Anzal Bartnick L 08/19/2015, 9:42 AM  [facility] [address] [place], [place], [address] Phone: [phone]   Fax:  [phone]  Name: Dawn Reyes MRN: [id number] Date of Birth: 1950-08-16  Geoffry Paradise, PT,DPT 08/19/15 9:42 AM Phone: [phone] Fax: [phone]

## 2015-08-30 ENCOUNTER — Encounter: Payer: Self-pay | Admitting: Neurology

## 2015-08-30 ENCOUNTER — Ambulatory Visit (INDEPENDENT_AMBULATORY_CARE_PROVIDER_SITE_OTHER): Payer: Medicare Other | Admitting: Neurology

## 2015-08-30 VITALS — HR 78 | Ht 62.0 in | Wt 144.0 lb

## 2015-08-30 DIAGNOSIS — K146 Glossodynia: Secondary | ICD-10-CM

## 2015-08-30 DIAGNOSIS — H8110 Benign paroxysmal vertigo, unspecified ear: Secondary | ICD-10-CM

## 2015-08-30 MED ORDER — GABAPENTIN 300 MG PO CAPS: 600.0000 mg | ORAL_CAPSULE | Freq: Three times a day (TID) | ORAL | 3 refills | Status: DC

## 2015-08-30 NOTE — Progress Notes (Signed)
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   Pain is well-controlled. Of note, she was previously diagnosed with BPPV.  She was supposed to go to vestibular rehab, but then it cleared up.  Over the past 2 days, she reports mild vertigo and nausea with positional changes, such as laying down.  It is brief.   HISTORY: She began noticing symptoms last year. The symptoms correlate soon after the dental procedure involving 2 canals of 2 of her right-sided upper teeth. She did see the dentist for this, who said that her symptoms are unlikely related.  She notes a "discomfort "on the left side of her face, involving the maxilla to the temple. It is not really a pain. However, she will occasionally have a brief shooting sensation, about a 5/10.  It is sensitive to the touch.  It appears to be sensitive when she is putting on her makeup. She is never in extreme pain however. She also has a burning sensation of her tongue. This also is a 5/10 in intensity. It is less painful when she is eating or drinking.  Lidocaine mouth rinse and ice provide brief relief  PAST MEDICAL HISTORY: Past Medical History:  Diagnosis Date  . Anxiety   . Benign hypertension   . 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 Candidiasis of mouth   . 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 Fibromyalgia   . GERD (gastroesophageal reflux disease)   . Hematochezia 02/13/2012  . Left sided abdominal pain 02/13/2012  . Osteoporosis, unspecified 07/30/2012    MEDICATIONS: Current Outpatient Prescriptions on File Prior to Visit  Medication Sig Dispense Refill  . CALCIUM PO Take by mouth.    . Cholecalciferol (VITAMIN D3)  2000 UNITS TABS Take 1 tablet by mouth daily.    . hydrochlorothiazide (HYDRODIURIL) 25 MG tablet Take 1 tablet (25 mg total) by mouth daily. 90 tablet 3  . Krill Oil 300 MG CAPS Take 1 capsule by mouth daily.    . magnesium 30 MG tablet Take 30 mg by mouth 2 (two) times daily.    . Multiple Vitamin (MULTIVITAMIN) tablet Take 1 tablet by mouth daily.    . zoledronic acid (RECLAST) 5 MG/100ML SOLN injection Inject 100 mLs (5 mg total) into the vein once. 100 mL 0   No current facility-administered medications on file prior to visit.     ALLERGIES: Allergies  Allergen Reactions  . Vicodin [Hydrocodone-Acetaminophen] Itching    FAMILY HISTORY: Family History  Problem Relation Age of Onset  . Hypertension Father   . Lung cancer Father   . Liver cancer Father   . Epilepsy Father   . Lung cancer Mother   . Hypertension Sister   . Osteoporosis Sister   . Hypertension Brother   . Arthritis Brother   . Diabetes Neg Hx     SOCIAL HISTORY: Social History   Social History  . Marital status: Widowed    Spouse name: N/A  . Number of children: 2  . Years of education: N/A   Occupational History  . [employer] History Main Topics  . Smoking status: Never Smoker  . Smokeless tobacco: Never Used  .  Alcohol use No  . Drug use: No  . Sexual activity: No   Other Topics Concern  . Not on file   Social History Narrative   Work or School: [employer]      Home Situation: lives with husband      Spiritual Beliefs: [employer]      Lifestyle: no regular exercise; diet is ok                REVIEW OF SYSTEMS: Constitutional: No fevers, chills, or sweats, no generalized fatigue, change in appetite Eyes: No visual changes, double vision, eye pain Ear, nose and throat: No hearing loss, ear pain, nasal congestion, sore throat Cardiovascular: No chest pain, palpitations Respiratory:  No shortness of breath at rest or with exertion,  wheezes GastrointestinaI: No nausea, vomiting, diarrhea, abdominal pain, fecal incontinence Genitourinary:  No dysuria, urinary retention or frequency Musculoskeletal:  No neck pain, back pain Integumentary: No rash, pruritus, skin lesions Neurological: as above Psychiatric: No depression, insomnia, anxiety Endocrine: No palpitations, fatigue, diaphoresis, mood swings, change in appetite, change in weight, increased thirst Hematologic/Lymphatic:  No purpura, petechiae. Allergic/Immunologic: no itchy/runny eyes, nasal congestion, recent allergic reactions, rashes  PHYSICAL EXAM: Vitals:   08/30/15 1433  Pulse: 78  Wt:  144lb Ht:  5'2" General: No acute distress.  Patient appears well-groomed.  normal body habitus. Head:  Normocephalic/atraumatic Eyes:  Fundi examined but not visualized Neck: supple, no paraspinal tenderness, full range of motion Heart:  Regular rate and rhythm Lungs:  Clear to auscultation bilaterally Back: No paraspinal tenderness Neurological Exam: alert and oriented to person, place, and time. Attention span and concentration intact, recent and remote memory intact, fund of knowledge intact.  Speech fluent and not dysarthric, language intact.  CN II-XII intact. Bulk and tone normal, muscle strength 5/5 throughout.  Sensation to light touch, temperature and vibration intact.  Deep tendon reflexes 2+ throughout, toes downgoing.  Finger to nose and heel to shin testing intact.  Gait normal.   Dix-Hallpike negative  IMPRESSION: Burning mouth syndrome, stable BPPV  PLAN: 1.  Continue gabapentin 600mg  three times daily 2.  Provided sheet for Epley maneuver.  If persists, recommend seeing vestibular rehab 3.  Follow up in one year or as needed.  15 minutes spent face to face with patient, over 50% spent counseling.  Metta Clines, DO  CC:  Colin Benton, DO

## 2015-08-30 NOTE — Patient Instructions (Signed)
1.  Continue gabapentin 600mg  three times daily 2.  Try the vertigo exercises at home if it recurs.  Otherwise, I recommend going back to the physical therapist for the dizziness.

## 2015-09-27 ENCOUNTER — Ambulatory Visit: Payer: BLUE CROSS/BLUE SHIELD | Admitting: Family Medicine

## 2015-11-24 ENCOUNTER — Other Ambulatory Visit: Payer: Self-pay | Admitting: Family Medicine

## 2015-11-24 DIAGNOSIS — Z1231 Encounter for screening mammogram for malignant neoplasm of breast: Secondary | ICD-10-CM

## 2015-12-16 ENCOUNTER — Ambulatory Visit: Payer: Medicare Other

## 2016-01-05 ENCOUNTER — Ambulatory Visit
Admission: RE | Admit: 2016-01-05 | Discharge: 2016-01-05 | Disposition: A | Discharge status: Home / Self Care | Payer: Medicare Other | Source: Ambulatory Visit | Attending: Family Medicine | Admitting: Family Medicine

## 2016-01-05 ENCOUNTER — Ambulatory Visit: Payer: Medicare Other

## 2016-01-05 DIAGNOSIS — Z1231 Encounter for screening mammogram for malignant neoplasm of breast: Secondary | ICD-10-CM | POA: Diagnosis not present

## 2016-02-09 DIAGNOSIS — M25561 Pain in right knee: Secondary | ICD-10-CM | POA: Diagnosis not present

## 2016-02-09 DIAGNOSIS — M25562 Pain in left knee: Secondary | ICD-10-CM | POA: Diagnosis not present

## 2016-02-09 DIAGNOSIS — M170 Bilateral primary osteoarthritis of knee: Secondary | ICD-10-CM | POA: Diagnosis not present

## 2016-02-09 DIAGNOSIS — R262 Difficulty in walking, not elsewhere classified: Secondary | ICD-10-CM | POA: Diagnosis not present

## 2016-02-21 DIAGNOSIS — M1711 Unilateral primary osteoarthritis, right knee: Secondary | ICD-10-CM | POA: Diagnosis not present

## 2016-02-21 DIAGNOSIS — M170 Bilateral primary osteoarthritis of knee: Secondary | ICD-10-CM | POA: Diagnosis not present

## 2016-02-21 DIAGNOSIS — M25561 Pain in right knee: Secondary | ICD-10-CM | POA: Diagnosis not present

## 2016-02-24 DIAGNOSIS — M1712 Unilateral primary osteoarthritis, left knee: Secondary | ICD-10-CM | POA: Diagnosis not present

## 2016-02-24 DIAGNOSIS — M25562 Pain in left knee: Secondary | ICD-10-CM | POA: Diagnosis not present

## 2016-02-28 DIAGNOSIS — M25561 Pain in right knee: Secondary | ICD-10-CM | POA: Diagnosis not present

## 2016-02-28 DIAGNOSIS — M1711 Unilateral primary osteoarthritis, right knee: Secondary | ICD-10-CM | POA: Diagnosis not present

## 2016-03-01 DIAGNOSIS — M25562 Pain in left knee: Secondary | ICD-10-CM | POA: Diagnosis not present

## 2016-03-01 DIAGNOSIS — M1712 Unilateral primary osteoarthritis, left knee: Secondary | ICD-10-CM | POA: Diagnosis not present

## 2016-03-06 DIAGNOSIS — M25561 Pain in right knee: Secondary | ICD-10-CM | POA: Diagnosis not present

## 2016-03-06 DIAGNOSIS — M1711 Unilateral primary osteoarthritis, right knee: Secondary | ICD-10-CM | POA: Diagnosis not present

## 2016-03-08 DIAGNOSIS — M1712 Unilateral primary osteoarthritis, left knee: Secondary | ICD-10-CM | POA: Diagnosis not present

## 2016-03-08 DIAGNOSIS — M25562 Pain in left knee: Secondary | ICD-10-CM | POA: Diagnosis not present

## 2016-03-13 DIAGNOSIS — M25562 Pain in left knee: Secondary | ICD-10-CM | POA: Diagnosis not present

## 2016-03-13 DIAGNOSIS — M25561 Pain in right knee: Secondary | ICD-10-CM | POA: Diagnosis not present

## 2016-03-13 DIAGNOSIS — M170 Bilateral primary osteoarthritis of knee: Secondary | ICD-10-CM | POA: Diagnosis not present

## 2016-04-10 NOTE — Progress Notes (Signed)
HPI:  Dawn Reyes is 49, has a PMH significant for HTN, BMI > 25, Anxiety, Depression (sees psychiatrist), burning tongue syndrome (sees neurologist), fibromyalgia, GERD and Osteoporosis (sees endocrinologist) here for an acute visit for:  Mild dyspnea: For 1-2 months Had a cold about 1 month ago, mild cough - cough now resolved Feels symptoms only when sitting, notices feels can quite take a deep breath and feels a little tight Denies cough, fevers, malaise, wt loss, DOE, CP with exertion, wheezing, GERD, stress, palpitations, swelling, hx lung disease or hx smoking Daughter is worried she has pneumonia   Due for medicare exam, labs, pneumonia vaccine. Reports is scheduled for this.  ROS: See pertinent positives and negatives per HPI.  Past Medical History:  Diagnosis Date  . Anxiety   . Benign hypertension   . 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 Candidiasis of mouth   . 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 Fibromyalgia   . GERD (gastroesophageal reflux disease)   . Hematochezia 02/13/2012  . Left sided abdominal pain 02/13/2012  . Osteoporosis, unspecified 07/30/2012    Past Surgical History:  Procedure Laterality Date  . ABDOMINAL HYSTERECTOMY    . BREAST LUMPECTOMY Right   . FOOT SURGERY Bilateral   . TONSILLECTOMY      Family History  Problem Relation Age of Onset  . Hypertension Father   . Lung cancer Father   . Liver cancer Father   . Epilepsy Father   . Lung cancer Mother   . Hypertension Sister   . Osteoporosis Sister   . Hypertension Brother   . Arthritis Brother   . Diabetes Neg Hx     Social History   Social History  . Marital status: Widowed    Spouse name: N/A  . Number of children: 2  . Years of education: N/A   Occupational History  . [employer] History Main Topics  . Smoking status: Never Smoker  . Smokeless tobacco: Never Used  . Alcohol use No  . Drug use: No  . Sexual  activity: No   Other Topics Concern  . None   Social History Narrative   Work or School: industry [employer]      Home Situation: lives with husband      Spiritual Beliefs: Baptist      Lifestyle: no regular exercise; diet is ok                 Current Outpatient Prescriptions:  .  CALCIUM PO, Take by mouth., Disp: , Rfl:  .  Cholecalciferol (VITAMIN D3) 2000 UNITS TABS, Take 1 tablet by mouth daily., Disp: , Rfl:  .  gabapentin (NEURONTIN) 300 MG capsule, Take 2 capsules (600 mg total) by mouth 3 (three) times daily., Disp: 540 capsule, Rfl: 3 .  hydrochlorothiazide (HYDRODIURIL) 25 MG tablet, Take 1 tablet (25 mg total) by mouth daily., Disp: 90 tablet, Rfl: 3 .  Krill Oil 300 MG CAPS, Take 1 capsule by mouth daily., Disp: , Rfl:  .  magnesium 30 MG tablet, Take 30 mg by mouth 2 (two) times daily., Disp: , Rfl:  .  Multiple Vitamin (MULTIVITAMIN) tablet, Take 1 tablet by mouth daily., Disp: , Rfl:  .  zoledronic acid (RECLAST) 5 MG/100ML SOLN injection, Inject 100 mLs (5 mg total) into the vein once., Disp: 100 mL, Rfl: 0  EXAM:  Vitals:   04/11/16 0837  BP: 100/68  Pulse: 77  Temp: 98.1 F (36.7 C)    Body mass index is 26.94 kg/m.  GENERAL: vitals reviewed and listed above, alert, oriented, appears well hydrated and in no acute distress  HEENT: atraumatic, conjunttiva clear, no obvious abnormalities on inspection of external nose and ears  NECK: no obvious masses on inspection  LUNGS: clear to auscultation bilaterally, no wheezes, rales or rhonchi, good air movement  CV: HRRR, no peripheral edema  MS: moves all extremities without noticeable abnormality  PSYCH: pleasant and cooperative, no obvious depression or anxiety  ASSESSMENT AND PLAN:  Discussed the following assessment and plan:  Dyspnea, unspecified type - Plan: DG Chest 2 View  -we discussed possible serious and likely etiologies, workup and treatment, treatment risks and return  precautions -after this discussion, [name] opted for starting with CXR and trial inhaler if xray ok, she does not want to take steroid. She will consider further eval with d-dimer, ? Echo, PFTs, ? CT if persistent symptoms and these measures unrevealing or not helping. -follow up advised as scheduled in a few weeks for CPE -of course, we advised [name]  to return or notify a doctor immediately if symptoms worsen or persist or new concerns arise.  Patient Instructions  BEFORE YOU LEAVE: -xray sheet -follow up: keep physical as scheduled  Get the xray. If ok we will try and inhaler to see if this helps and then plab do further testing if no relief.  I hope you are feeling better soon! Seek care immediately if worsening, new concerns or you are not improving with treatment.      Colin Benton R., DO

## 2016-04-11 ENCOUNTER — Encounter: Payer: Self-pay | Admitting: Family Medicine

## 2016-04-11 ENCOUNTER — Ambulatory Visit (INDEPENDENT_AMBULATORY_CARE_PROVIDER_SITE_OTHER)
Admission: RE | Admit: 2016-04-11 | Discharge: 2016-04-11 | Disposition: A | Discharge status: Home / Self Care | Payer: Medicare Other | Source: Ambulatory Visit | Attending: Family Medicine | Admitting: Family Medicine

## 2016-04-11 ENCOUNTER — Other Ambulatory Visit: Payer: Self-pay | Admitting: *Deleted

## 2016-04-11 ENCOUNTER — Ambulatory Visit (INDEPENDENT_AMBULATORY_CARE_PROVIDER_SITE_OTHER): Payer: Medicare Other | Admitting: Family Medicine

## 2016-04-11 VITALS — BP 100/68 | HR 77 | Temp 98.1°F | Ht 62.0 in | Wt 147.3 lb

## 2016-04-11 DIAGNOSIS — R0602 Shortness of breath: Secondary | ICD-10-CM | POA: Diagnosis not present

## 2016-04-11 DIAGNOSIS — R0600 Dyspnea, unspecified: Secondary | ICD-10-CM | POA: Diagnosis not present

## 2016-04-11 MED ORDER — ALBUTEROL SULFATE HFA 108 (90 BASE) MCG/ACT IN AERS: INHALATION_SPRAY | RESPIRATORY_TRACT | 0 refills | Status: DC

## 2016-04-11 NOTE — Patient Instructions (Signed)
BEFORE YOU LEAVE: -xray sheet -follow up: keep physical as scheduled  Get the xray. If ok we will try and inhaler to see if this helps and then plab do further testing if no relief.  I hope you are feeling better soon! Seek care immediately if worsening, new concerns or you are not improving with treatment.

## 2016-04-11 NOTE — Progress Notes (Signed)
Pre visit review using our clinic review tool, if applicable. No additional management support is needed unless otherwise documented below in the visit note. 

## 2016-04-11 NOTE — Telephone Encounter (Signed)
Rx done-see results note. 

## 2016-04-20 ENCOUNTER — Other Ambulatory Visit (INDEPENDENT_AMBULATORY_CARE_PROVIDER_SITE_OTHER): Payer: Medicare Other

## 2016-04-20 DIAGNOSIS — M810 Age-related osteoporosis without current pathological fracture: Secondary | ICD-10-CM

## 2016-04-20 DIAGNOSIS — E876 Hypokalemia: Secondary | ICD-10-CM

## 2016-04-20 LAB — BASIC METABOLIC PANEL
BUN: 10 mg/dL (ref 6–23)
CO2: 33 mEq/L — ABNORMAL HIGH (ref 19–32)
Calcium: 9.4 mg/dL (ref 8.4–10.5)
Chloride: 94 mEq/L — ABNORMAL LOW (ref 96–112)
Creatinine, Ser: 0.78 mg/dL (ref 0.40–1.20)
GFR: 78.4 mL/min (ref >60.00)
Glucose, Bld: 89 mg/dL (ref 70–99)
POTASSIUM: 3.1 meq/L — AB (ref 3.5–5.1)
Sodium: 135 mEq/L (ref 135–145)

## 2016-04-24 ENCOUNTER — Telehealth: Payer: Self-pay

## 2016-04-24 ENCOUNTER — Encounter: Payer: Self-pay | Admitting: Endocrinology

## 2016-04-24 ENCOUNTER — Ambulatory Visit (INDEPENDENT_AMBULATORY_CARE_PROVIDER_SITE_OTHER): Payer: Medicare Other | Admitting: Endocrinology

## 2016-04-24 VITALS — BP 122/76 | HR 70 | Ht 62.0 in | Wt 148.0 lb

## 2016-04-24 DIAGNOSIS — M810 Age-related osteoporosis without current pathological fracture: Secondary | ICD-10-CM

## 2016-04-24 DIAGNOSIS — E559 Vitamin D deficiency, unspecified: Secondary | ICD-10-CM | POA: Diagnosis not present

## 2016-04-24 NOTE — Telephone Encounter (Signed)
Called patient and gave her the appointment time for the bone density and she stated an understanding

## 2016-04-24 NOTE — Patient Instructions (Signed)
start eating potassium rich foods daily (pinto beans, lentils, unsweetened peanut butter, salmon, broccoli, soymilk, brussel sprouts, zucchini, tomatoes, mushrooms, tomato juice, oranges)

## 2016-04-24 NOTE — Telephone Encounter (Signed)
Please schedule for Reclast infusion, she had her last infusion on 05/19/15

## 2016-04-24 NOTE — Progress Notes (Signed)
Patient ID: [name], female   DOB: 1950/11/11, 66 y.o.   MRN: [id number]   History of Present Illness:  OSTEOPOROSIS:   Background history includes menopause at the usual age of about 76+.   She was told by her gynecologist in 2006 that she had osteopenia on screening and bone density.  She  had a T score of -2.0 at the right right neck of femur but her spine was normal At some point the patient was given a trial of Fosamax which she took for about 2 years but stopped it because of difficulty with lower chest and upper abdominal discomfort when she would take this She also tried this again in 2014 but could not tolerate it for longer than 6 months  Recent history: In 2011 she was found to have a T score of -2.5 at the hip and  subsequently this was -2.7 at the left hip in 2014 She was given Atelvia in 2014 and she took this for 3-4 months but since she could not get a refill on this he did not continue it and is here for further management She thinks she has lost only about a half an inch in height since her youth   No significant back pain and no recent history of fracture She also has been taking the 2000 unit doses of vitamin D for the last few years and also calcium twice daily She was given Reclast on 05/19/15, previously had an injection in 2015 She had no side effects with this She is now here for regular follow-up    Past Medical History:  Diagnosis Date  . Anxiety   . Benign hypertension   . 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 Candidiasis of mouth   . 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 Fibromyalgia   . GERD (gastroesophageal reflux disease)   . Hematochezia 02/13/2012  . Left sided abdominal pain 02/13/2012  . Osteoporosis, unspecified 07/30/2012    Past Surgical History:  Procedure Laterality Date  . ABDOMINAL HYSTERECTOMY    . BREAST LUMPECTOMY Right   . FOOT SURGERY Bilateral   . TONSILLECTOMY      Family  History  Problem Relation Age of Onset  . Hypertension Father   . Lung cancer Father   . Liver cancer Father   . Epilepsy Father   . Lung cancer Mother   . Hypertension Sister   . Osteoporosis Sister   . Hypertension Brother   . Arthritis Brother   . Diabetes Neg Hx     Social History:  reports that she has never smoked. She has never used smokeless tobacco. She reports that she does not drink alcohol or use drugs.  Allergies:  Allergies  Allergen Reactions  . Vicodin [Hydrocodone-Acetaminophen] Itching    Allergies as of 04/24/2016      Reactions   Vicodin [hydrocodone-acetaminophen] Itching      Medication List       Accurate as of 04/24/16  8:23 AM. Always use your most recent med list.          albuterol 108 (90 Base) MCG/ACT inhaler Commonly known as:  PROAIR HFA 2 puffs every 4-6 hrs as needed   CALCIUM PO Take by mouth.   gabapentin 300 MG capsule Commonly known as:  NEURONTIN Take 2 capsules (600 mg total) by mouth 3 (three) times daily.   hydrochlorothiazide 25 MG tablet Commonly known as:  HYDRODIURIL Take 1 tablet (  25 mg total) by mouth daily.   Krill Oil 300 MG Caps Take 1 capsule by mouth daily.   magnesium 30 MG tablet Take 30 mg by mouth 2 (two) times daily.   multivitamin tablet Take 1 tablet by mouth daily.   Vitamin D3 2000 units Tabs Take 1 tablet by mouth daily.   zoledronic acid 5 MG/100ML Soln injection Commonly known as:  RECLAST Inject 100 mLs (5 mg total) into the vein once.        REVIEW OF SYSTEMS:         She is due to  have wellness exam with her PCP  Taking HCTZ for hypertension, electrolytes show significant hypokalemia compared to before, to be addressed by PCP      EXAM:  BP 122/76   Pulse 70   Ht 5\' 2"  (1.575 m)   Wt 148 lb (67.1 kg)   BMI 27.07 kg/m    ASSESSMENT:   History of osteoporosis, long-standing and clinically asymptomatic She has mild osteoporosis at the left neck of the femur with T  score -2.7 in 2014  She has tolerated Reclast and is due to get another injection next month  Again asymptomatic and no history of recent fractures  Clinically she is doing well and is compliant with her vitamin D and calcium   PLAN:   Needs to have repeat dose of Reclast next month Bone density to be scheduled Recheck vitamin D on the next visit  Lola Lofaro 04/24/2016, 8:23 AM

## 2016-04-25 ENCOUNTER — Ambulatory Visit (INDEPENDENT_AMBULATORY_CARE_PROVIDER_SITE_OTHER)
Admission: RE | Admit: 2016-04-25 | Discharge: 2016-04-25 | Disposition: A | Discharge status: Home / Self Care | Payer: Medicare Other | Source: Ambulatory Visit | Attending: Endocrinology | Admitting: Endocrinology

## 2016-04-25 DIAGNOSIS — M810 Age-related osteoporosis without current pathological fracture: Secondary | ICD-10-CM | POA: Diagnosis not present

## 2016-04-26 NOTE — Progress Notes (Signed)
Medicare Annual Preventive Care Visit  (initial annual wellness or annual wellness exam)  Concerns and/or follow up today:  PMH HTN, Anxiety, Depression, burning tongue syndrome, fibromyalgia, GERD, mild dyslipidemia, mild hyperglycemia and osteoporosis (seeing Dr. Dwyane Dee, endocrinology). Continues to see her endocrinologist and neurologist. However, no longer sees psychiatrist. No longer on any medications for depression and feels she is doing well. Cries easily, but o/w feels mood is good and declines any help for mood at this time. No SI, thoughts of self harm, frequent depressed mood. Had what sounds like a viral resp illness last month and had some dyspnea, see recent OV notes. CXR normal. Had very mildly low potassium on recent BMP her endocrinologist did. Will be rechecking  - possibly from her diuretic. Reports mild dyspnea continues - is any time, particularly at rest. Denies CP, SOB, DOE, swelling. She does not want to undergo any CV testing for this right now. Agrees to PFT - however did not feel like alb helped. Due for phq9, Prevnar 13, hep c screening, lipid and diabetes screening with labs in May. See EPIC HM section, other preventive care measures UTD. See scanned documentation under Media in EPIC for risk assessment and further details HPI .   ROS: negative for report of fevers, unintentional weight loss, vision changes, vision loss, hearing loss or change, chest pain, sob, hemoptysis, melena, hematochezia, hematuria, genital discharge or lesions, falls, bleeding or bruising, loc, thoughts of suicide or self harm, memory loss  1.) Patient-completed health risk assessment  - completed and reviewed, see scanned documentation  2.) Review of Medical History: -PMH, PSH, Family History and current specialty and care providers reviewed and updated and listed below  - see scanned in document in chart and below  Past Medical History:  Diagnosis Date  . Anxiety   . Benign hypertension    . 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 Candidiasis of mouth   . 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 Fibromyalgia   . GERD (gastroesophageal reflux disease)   . Hematochezia 02/13/2012  . Left sided abdominal pain 02/13/2012  . Osteoporosis, unspecified 07/30/2012    Past Surgical History:  Procedure Laterality Date  . ABDOMINAL HYSTERECTOMY    . BREAST LUMPECTOMY Right   . FOOT SURGERY Bilateral   . TONSILLECTOMY      Social History   Social History  . Marital status: Widowed    Spouse name: N/A  . Number of children: 2  . Years of education: N/A   Occupational History  . [employer] History Main Topics  . Smoking status: Never Smoker  . Smokeless tobacco: Never Used  . Alcohol use No  . Drug use: No  . Sexual activity: No   Other Topics Concern  . Not on file   Social History Narrative   Work or School: [employer] for [employer]      Home Situation: lives with husband      Spiritual Beliefs: Baptist      Lifestyle: no regular exercise; diet is ok                Family History  Problem Relation Age of Onset  . Hypertension Father   . Lung cancer Father   . Liver cancer Father   . Epilepsy Father   . Lung cancer Mother   . Hypertension Sister   . Osteoporosis Sister   . Hypertension Brother   . Arthritis  Brother   . Diabetes Neg Hx     Current Outpatient Prescriptions on File Prior to Visit  Medication Sig Dispense Refill  . albuterol (PROAIR HFA) 108 (90 Base) MCG/ACT inhaler 2 puffs every 4-6 hrs as needed 1 Inhaler 0  . CALCIUM PO Take by mouth.    . Cholecalciferol (VITAMIN D3) 2000 UNITS TABS Take 1 tablet by mouth daily.    Marland Kitchen gabapentin (NEURONTIN) 300 MG capsule Take 2 capsules (600 mg total) by mouth 3 (three) times daily. 540 capsule 3  . hydrochlorothiazide (HYDRODIURIL) 25 MG tablet Take 1 tablet (25 mg total) by mouth daily. 90 tablet 3  . Krill Oil 300 MG CAPS Take 1 capsule by  mouth daily.    . magnesium 30 MG tablet Take 30 mg by mouth 2 (two) times daily.    . Multiple Vitamin (MULTIVITAMIN) tablet Take 1 tablet by mouth daily.    . zoledronic acid (RECLAST) 5 MG/100ML SOLN injection Inject 100 mLs (5 mg total) into the vein once. 100 mL 0   No current facility-administered medications on file prior to visit.      3.) Review of functional ability and level of safety:  Any difficulty hearing?  See scanned documentation  History of falling?  See scanned documentation  Any trouble with IADLs - using a phone, using transportation, grocery shopping, preparing meals, doing housework, doing laundry, taking medications and managing money?  See scanned documentation  Advance Directives? Reports has HCPOA and living will. Would like for me to list daughter's name in EPIC section for HCPOA.  See summary of recommendations in Patient Instructions below.  4.) Physical Exam Vitals:   04/27/16 0658  BP: 118/76  Pulse: 66  Temp: 98 F (36.7 C)   Estimated body mass index is 27.29 kg/m as calculated from the following:   Height as of this encounter: 5' 1.75" (1.568 m).   Weight as of this encounter: 148 lb (67.1 kg).  EKG (optional): deferred  General: alert, appear well hydrated and in no acute distress  NECK: no masses  HEENT: visual acuity grossly intact, normal appearance of ear canals and TMs, clear nasal congestion, mild post oropharyngeal erythema with PND, no tonsillar edema or exudate, no sinus TTP  CV: HRRR, no peripheral edema  Lungs: CTA bilaterally  Psych: pleasant and cooperative, no obvious depression or anxiety  Cognitive function grossly intact  See patient instructions for recommendations.  Education and counseling regarding the above review of health provided with a plan for the following: -see scanned patient completed form for further details -fall prevention strategies discussed  -healthy lifestyle discussed -importance  and resources for completing advanced directives discussed -see patient instructions below for any other recommendations provided  4)The following written screening schedule of preventive measures were reviewed with assessment and plan made per below, orders and patient instructions:      AAA screening n/a     Alcohol screening done     Obesity Screening and counseling done     STI screening (Hep C if born 51-65) offered and per pt wishes     Tobacco Screening done       Pneumococcal (PPSV23 -one dose after 64, one before if risk factors), influenza yearly and hepatitis B vaccines (if high risk - end stage renal disease, IV drugs, homosexual men, live in home for mentally retarded, hemophilia receiving factors) ASSESSMENT/PLAN: refused      Screening mammograph (yearly if >40) ASSESSMENT/PLAN: utd  Screening Pap smear/pelvic exam (q2 years) ASSESSMENT/PLAN: n/a, declined      Colorectal cancer screening (FOBT yearly or flex sig q4y or colonoscopy q10y or barium enema q4y) ASSESSMENT/PLAN: utd      Bone mass measurements(covered q2y if indicated - estrogen def, osteoporosis, hyperparathyroid, vertebral abnormalities, osteoporosis or steroids) ASSESSMENT/PLAN: sees endo for treatment and monitoring osteoporosis      Screening for glaucoma(q1y if high risk - diabetes, FH, AA and > 50 or hispanic and > 65) ASSESSMENT/PLAN: utd, see scanned documentation      Medical nutritional therapy for individuals with diabetes or renal disease ASSESSMENT/PLAN: see orders      Cardiovascular screening blood tests (lipids q5y) ASSESSMENT/PLAN: see orders and labs      Diabetes screening tests ASSESSMENT/PLAN: see orders and labs   7.) Summary:   Medicare annual wellness visit, initial -risk factors and conditions per above assessment were discussed and treatment, recommendations and referrals were offered per documentation above and orders and patient instructions.  Recurrent major  depressive disorder, in full remission ([place]) -seems to be doing ok, cries easily and discussed CBT/tx/screening -she declines any treatment for this  Essential hypertension, benign - Plan: Basic metabolic panel, CBC -continue current treatment, may need to add K+ with diuretic  Osteoporosis, unspecified osteoporosis type, unspecified pathological fracture presence -sees endo for management  Dyspnea, unspecified type - Plan: Pulmonary function test -discussed potential etiologies, advised ESE and PFT - she declined CV eval, but agreed to PFTs -advised to follow up promptly if any worsening and to let us know if wishes to do CV testing  Hypokalemia -discussed -recheck, may need to add K+ with diuretic  Hep C screening - Plan: Hepatitis C antibody -screening for baby boomer  Hyperlipidemia, unspecified hyperlipidemia type - Plan: Lipid panel Hyperglycemia - Plan: Hemoglobin A1c -lifestyle recs  Patient Instructions   BEFORE YOU LEAVE: -follow up: 3-4 months -labs  -We placed a referral for you as discussed for the lung testing. It usually takes about 1-2 weeks to process and schedule this referral. If you have not heard from [place] regarding this appointment in 2 weeks please contact our office.  We have ordered labs or studies at this visit. It can take up to 1-2 weeks for results and processing. IF results require follow up or explanation, we will call you with instructions. Clinically stable results will be released to your [facility]. If you have not heard from [place] or cannot find your results in [facility] in 2 weeks please contact our office at [phone].  If you are not yet signed up for [facility], please consider signing up.   Dawn Reyes , Thank you for taking time to come for your Medicare Wellness Visit. I appreciate your ongoing commitment to your health goals. Please review the following plan we discussed and let me know if I can assist you in the future.   These are the goals  we discussed: Goals     We recommend the following healthy lifestyle for LIFE: 1) Small portions.   Tip: eat off of a salad plate instead of a dinner plate.  Tip: if you need more or a snack choose fruits, veggies and/or a handful of nuts or seeds.  2) Eat a healthy clean diet.  * Tip: Avoid (less then 1 serving per week): processed foods, sweets, sweetened drinks, white starches (rice, flour, bread, potatoes, pasta, etc), red meat, fast foods, butter  *Tip: CHOOSE instead   * 5-9 servings per day of fresh  or frozen fruits and vegetables (but not corn, potatoes, bananas, canned or dried fruit)   *nuts and seeds, beans   *olives and olive oil   *small portions of lean meats such as fish and white chicken    *small portions of whole grains  3)Get at least 150 minutes of sweaty aerobic exercise per week.  4)Reduce stress - consider counseling, meditation and relaxation to balance other aspects of your life.       This is a list of the screening recommended for you and due dates:  Health Maintenance  Topic Date Due  . Pneumonia vaccines (1 of 2 - PCV13) Pt refused  . Flu Shot  08/09/2016  . Colon Cancer Screening  01/24/2017  . Mammogram  01/04/2018  . Tetanus Vaccine  12/21/2022  . DEXA scan (bone density measurement)  Completed  .  Hepatitis C: One time screening is recommended by Center for Disease Control  (CDC) for  adults born from [birth year] through 1966.   Ordered today  *Topic was postponed. The date shown is not the original due date.    WE NOW OFFER   [facility] FAST TRACK!!!  SAME DAY Appointments for ACUTE CARE  Such as: Sprains, Injuries, cuts, abrasions, rashes, muscle pain, joint pain, back pain Colds, flu, sore throats, headache, allergies, cough, fever  Ear pain, sinus and eye infections Abdominal pain, nausea, vomiting, diarrhea, upset stomach Animal/insect bites  3 Easy Ways to Schedule: Walk-In Scheduling Call in scheduling Mychart Sign-up:  [email or web address]           Colin Benton R., DO

## 2016-04-27 ENCOUNTER — Ambulatory Visit (INDEPENDENT_AMBULATORY_CARE_PROVIDER_SITE_OTHER): Payer: Medicare Other | Admitting: Family Medicine

## 2016-04-27 ENCOUNTER — Encounter: Payer: Self-pay | Admitting: Family Medicine

## 2016-04-27 VITALS — BP 118/76 | HR 66 | Temp 98.0°F | Ht 61.75 in | Wt 148.0 lb

## 2016-04-27 DIAGNOSIS — I10 Essential (primary) hypertension: Secondary | ICD-10-CM

## 2016-04-27 DIAGNOSIS — M810 Age-related osteoporosis without current pathological fracture: Secondary | ICD-10-CM | POA: Diagnosis not present

## 2016-04-27 DIAGNOSIS — Z0000 Encounter for general adult medical examination without abnormal findings: Secondary | ICD-10-CM | POA: Diagnosis not present

## 2016-04-27 DIAGNOSIS — E876 Hypokalemia: Secondary | ICD-10-CM | POA: Diagnosis not present

## 2016-04-27 DIAGNOSIS — Z7289 Other problems related to lifestyle: Secondary | ICD-10-CM | POA: Diagnosis not present

## 2016-04-27 DIAGNOSIS — R0600 Dyspnea, unspecified: Secondary | ICD-10-CM | POA: Diagnosis not present

## 2016-04-27 DIAGNOSIS — R739 Hyperglycemia, unspecified: Secondary | ICD-10-CM | POA: Diagnosis not present

## 2016-04-27 DIAGNOSIS — E785 Hyperlipidemia, unspecified: Secondary | ICD-10-CM

## 2016-04-27 DIAGNOSIS — F3342 Major depressive disorder, recurrent, in full remission: Secondary | ICD-10-CM | POA: Diagnosis not present

## 2016-04-27 LAB — CBC
HEMATOCRIT: 39.5 % (ref 36.0–46.0)
HEMOGLOBIN: 13.2 g/dL (ref 12.0–15.0)
MCHC: 33.4 g/dL (ref 30.0–36.0)
MCV: 94.3 fl (ref 78.0–100.0)
Platelets: 335 10*3/uL (ref 150.0–400.0)
RBC: 4.19 Mil/uL (ref 3.87–5.11)
RDW: 13.4 % (ref 11.5–15.5)
WBC: 5 10*3/uL (ref 4.0–10.5)

## 2016-04-27 LAB — BASIC METABOLIC PANEL
BUN: 14 mg/dL (ref 6–23)
CHLORIDE: 97 meq/L (ref 96–112)
CO2: 34 meq/L — AB (ref 19–32)
CREATININE: 0.68 mg/dL (ref 0.40–1.20)
Calcium: 9.5 mg/dL (ref 8.4–10.5)
GFR: 91.85 mL/min (ref >60.00)
GLUCOSE: 86 mg/dL (ref 70–99)
Potassium: 3.5 mEq/L (ref 3.5–5.1)
Sodium: 139 mEq/L (ref 135–145)

## 2016-04-27 LAB — LIPID PANEL
CHOL/HDL RATIO: 3
Cholesterol: 208 mg/dL — ABNORMAL HIGH (ref 0–200)
HDL: 77.7 mg/dL (ref >39.00)
LDL Cholesterol: 108 mg/dL — ABNORMAL HIGH (ref 0–99)
NONHDL: 129.93
Triglycerides: 110 mg/dL (ref 0.0–149.0)
VLDL: 22 mg/dL (ref 0.0–40.0)

## 2016-04-27 LAB — HEMOGLOBIN A1C: Hgb A1c MFr Bld: 5.8 % (ref 4.6–6.5)

## 2016-04-27 NOTE — Patient Instructions (Signed)
BEFORE YOU LEAVE: -follow up: 3-4 months -labs  -We placed a referral for you as discussed for the lung testing. It usually takes about 1-2 weeks to process and schedule this referral. If you have not heard from [place] regarding this appointment in 2 weeks please contact our office.  We have ordered labs or studies at this visit. It can take up to 1-2 weeks for results and processing. IF results require follow up or explanation, we will call you with instructions. Clinically stable results will be released to your [facility]. If you have not heard from [place] or cannot find your results in [facility] in 2 weeks please contact our office at [phone].  If you are not yet signed up for [facility], please consider signing up.   Dawn Reyes , Thank you for taking time to come for your Medicare Wellness Visit. I appreciate your ongoing commitment to your health goals. Please review the following plan we discussed and let me know if I can assist you in the future.   These are the goals we discussed: Goals     We recommend the following healthy lifestyle for LIFE: 1) Small portions.   Tip: eat off of a salad plate instead of a dinner plate.  Tip: if you need more or a snack choose fruits, veggies and/or a handful of nuts or seeds.  2) Eat a healthy clean diet.  * Tip: Avoid (less then 1 serving per week): processed foods, sweets, sweetened drinks, white starches (rice, flour, bread, potatoes, pasta, etc), red meat, fast foods, butter  *Tip: CHOOSE instead   * 5-9 servings per day of fresh or frozen fruits and vegetables (but not corn, potatoes, bananas, canned or dried fruit)   *nuts and seeds, beans   *olives and olive oil   *small portions of lean meats such as fish and white chicken    *small portions of whole grains  3)Get at least 150 minutes of sweaty aerobic exercise per week.  4)Reduce stress - consider counseling, meditation and relaxation to balance other aspects of your life.       This is a  list of the screening recommended for you and due dates:  Health Maintenance  Topic Date Due  . Pneumonia vaccines (1 of 2 - PCV13) Pt refused  . Flu Shot  08/09/2016  . Colon Cancer Screening  01/24/2017  . Mammogram  01/04/2018  . Tetanus Vaccine  12/21/2022  . DEXA scan (bone density measurement)  Completed  .  Hepatitis C: One time screening is recommended by Center for Disease Control  (CDC) for  adults born from [birth year] through 1966.   Ordered today  *Topic was postponed. The date shown is not the original due date.    WE NOW OFFER   [facility] FAST TRACK!!!  SAME DAY Appointments for ACUTE CARE  Such as: Sprains, Injuries, cuts, abrasions, rashes, muscle pain, joint pain, back pain Colds, flu, sore throats, headache, allergies, cough, fever  Ear pain, sinus and eye infections Abdominal pain, nausea, vomiting, diarrhea, upset stomach Animal/insect bites  3 Easy Ways to Schedule: Walk-In Scheduling Call in scheduling Mychart Sign-up: [email or web address]

## 2016-04-27 NOTE — Progress Notes (Signed)
Pre visit review using our clinic review tool, if applicable. No additional management support is needed unless otherwise documented below in the visit note. 

## 2016-04-28 ENCOUNTER — Ambulatory Visit (INDEPENDENT_AMBULATORY_CARE_PROVIDER_SITE_OTHER): Payer: Medicare Other | Admitting: Internal Medicine

## 2016-04-28 DIAGNOSIS — R0600 Dyspnea, unspecified: Secondary | ICD-10-CM

## 2016-04-28 LAB — PULMONARY FUNCTION TEST
DL/VA % PRED: 85 %
DL/VA: 3.9 ml/min/mmHg/L
DLCO COR % PRED: 77 %
DLCO COR: 16.79 ml/min/mmHg
DLCO UNC % PRED: 79 %
DLCO unc: 17.09 ml/min/mmHg
FEF 25-75 POST: 2.45 L/s
FEF 25-75 PRE: 2.5 L/s
FEF2575-%CHANGE-POST: -1 %
FEF2575-%PRED-PRE: 128 %
FEF2575-%Pred-Post: 126 %
FEV1-%CHANGE-POST: -1 %
FEV1-%PRED-PRE: 113 %
FEV1-%Pred-Post: 112 %
FEV1-POST: 2.45 L
FEV1-Pre: 2.48 L
FEV1FVC-%Change-Post: 2 %
FEV1FVC-%Pred-Pre: 104 %
FEV6-%CHANGE-POST: -3 %
FEV6-%PRED-POST: 107 %
FEV6-%Pred-Pre: 111 %
FEV6-POST: 2.96 L
FEV6-PRE: 3.07 L
FEV6FVC-%Pred-Post: 104 %
FEV6FVC-%Pred-Pre: 104 %
FVC-%Change-Post: -3 %
FVC-%PRED-POST: 103 %
FVC-%Pred-Pre: 106 %
FVC-Post: 2.96 L
FVC-Pre: 3.07 L
Post FEV1/FVC ratio: 83 %
Post FEV6/FVC ratio: 100 %
Pre FEV1/FVC ratio: 81 %
Pre FEV6/FVC Ratio: 100 %
RV % pred: 89 %
RV: 1.8 L
TLC % pred: 101 %
TLC: 4.82 L

## 2016-04-28 LAB — HEPATITIS C ANTIBODY: HCV Ab: NEGATIVE

## 2016-04-28 NOTE — Progress Notes (Signed)
PFT done today. 

## 2016-05-01 NOTE — Addendum Note (Signed)
Addended by: Agnes Lawrence on: 05/01/2016 11:54 AM   Modules accepted: Orders

## 2016-05-01 NOTE — Addendum Note (Signed)
Addended by: Agnes Lawrence on: 05/01/2016 11:04 AM   Modules accepted: Orders

## 2016-05-01 NOTE — Addendum Note (Signed)
Addended by: Agnes Lawrence on: 05/01/2016 12:05 PM   Modules accepted: Orders

## 2016-05-02 ENCOUNTER — Telehealth: Payer: Self-pay | Admitting: Nutrition

## 2016-05-02 NOTE — Telephone Encounter (Signed)
Patient ask you to give her a call °

## 2016-05-03 ENCOUNTER — Encounter: Payer: Self-pay | Admitting: Emergency Medicine

## 2016-05-03 ENCOUNTER — Ambulatory Visit (INDEPENDENT_AMBULATORY_CARE_PROVIDER_SITE_OTHER): Payer: Medicare Other | Admitting: Emergency Medicine

## 2016-05-03 DIAGNOSIS — R0602 Shortness of breath: Secondary | ICD-10-CM

## 2016-05-03 DIAGNOSIS — R0600 Dyspnea, unspecified: Secondary | ICD-10-CM | POA: Insufficient documentation

## 2016-05-03 NOTE — Telephone Encounter (Signed)
Scheduled for 05/10/16

## 2016-05-03 NOTE — Progress Notes (Signed)
Please let patient know that the bone density is improving significantly, continue current regimen

## 2016-05-03 NOTE — Telephone Encounter (Signed)
Appt. Scheduled for 05/10/16 for reclast infusion

## 2016-05-03 NOTE — Assessment & Plan Note (Signed)
Dyspnea that is characterized by the sensation of inability to get an adequate deep breath. Her chest x-ray is normal, no evidence elevated HD, no evidence for a hiatal hernia. She does have some chest tightness - ? GERD although she denies, ? Anxiety. No wheeze on exam. Pulmonary function testing is normal. There is no vascular defect when her diffusion capacity is adjusted for her alveolar volume. I believe that she is experiencing some mild restriction due to some weight gain. I've encouraged her to continue to exercise, try to lose a few pounds that she has gained. Given her chest tightness it is reasonable to get the echocardiogram although I think her risk for coronary disease is low based on her history and her family history. I don't believe she needs any intervention at this time. If her symptoms evolve, change that I would like to see her again to decide whether any further workup is indicated including possible inspiratory and exp force (MIP, MEP) or cardiopulmonary exercise testing.

## 2016-05-03 NOTE — Progress Notes (Signed)
Subjective:    Patient ID: Dawn Reyes, female    DOB: [birth year], 66 y.o.   MRN: [id number]  HPI 66 year old never smoker with a history of hypertension, GERD, fibromyalgia, depression. She is referred for evaluation of dyspnea. Began to have sx about 2-3 months ago. She first noticed some dyspnea with sitting - needs to get a bigger breath, sighs to get a breath. Never feels full obstruction. She describes a chest tightness, mid chest, present all the time- present currently. No way to make it better or worse. Never coughs. She has gained about 6-8 lbs over the last year. She is able to work in the yard, planting. Chases her grandkids.   Denies any GERD sx. No exertional CP or angina.   Full PFT reviewed by me >>   Chest x-ray 04/11/16 personally reviewed. No infiltrates, normal heart size, no effusions.   Review of Systems  Constitutional: Negative for fever and unexpected weight change.  HENT: Negative for congestion, dental problem, ear pain, nosebleeds, postnasal drip, rhinorrhea, sinus pressure, sneezing, sore throat and trouble swallowing.   Eyes: Negative for redness and itching.  Respiratory: Positive for chest tightness and shortness of breath. Negative for cough and wheezing.   Cardiovascular: Negative for palpitations and leg swelling.  Gastrointestinal: Negative for nausea and vomiting.  Genitourinary: Negative for dysuria.  Musculoskeletal: Negative for joint swelling.  Skin: Negative for rash.  Neurological: Negative for headaches.  Hematological: Does not bruise/bleed easily.  Psychiatric/Behavioral: Negative for dysphoric mood. The patient is not nervous/anxious.    Past Medical History:  Diagnosis Date  . Anxiety   . Benign hypertension   . 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 Candidiasis of mouth   . 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 Fibromyalgia   . GERD (gastroesophageal reflux disease)   . Hematochezia 02/13/2012  .  Left sided abdominal pain 02/13/2012  . Osteoporosis, unspecified 07/30/2012     Family History  Problem Relation Age of Onset  . Hypertension Father   . Lung cancer Father   . Liver cancer Father   . Epilepsy Father   . Lung cancer Mother   . Hypertension Sister   . Osteoporosis Sister   . Hypertension Brother   . Arthritis Brother   . Diabetes Neg Hx      Social History   Social History  . Marital status: Widowed    Spouse name: N/A  . Number of children: 2  . Years of education: N/A   Occupational History  . [employer] History Main Topics  . Smoking status: Never Smoker  . Smokeless tobacco: Never Used  . Alcohol use No  . Drug use: No  . Sexual activity: No   Other Topics Concern  . Not on file   Social History Narrative   Work or School: [employer]      Home Situation: lives with husband      Spiritual Beliefs: [employer]      Lifestyle: no regular exercise; diet is ok                 Allergies  Allergen Reactions  . Vicodin [Hydrocodone-Acetaminophen] Itching     Outpatient Medications Prior to Visit  Medication Sig Dispense Refill  . albuterol (PROAIR HFA) 108 (90 Base) MCG/ACT inhaler 2 puffs every 4-6 hrs as needed 1 Inhaler 0  . CALCIUM PO Take by mouth.    Marland Kitchen  Cholecalciferol (VITAMIN D3) 2000 UNITS TABS Take 1 tablet by mouth daily.    Marland Kitchen gabapentin (NEURONTIN) 300 MG capsule Take 2 capsules (600 mg total) by mouth 3 (three) times daily. 540 capsule 3  . hydrochlorothiazide (HYDRODIURIL) 25 MG tablet Take 1 tablet (25 mg total) by mouth daily. 90 tablet 3  . Krill Oil 300 MG CAPS Take 1 capsule by mouth daily.    . magnesium 30 MG tablet Take 30 mg by mouth 2 (two) times daily.    . Multiple Vitamin (MULTIVITAMIN) tablet Take 1 tablet by mouth daily.    . Probiotic Product (PROBIOTIC PO) Take by mouth.    . zoledronic acid (RECLAST) 5 MG/100ML SOLN injection Inject 100 mLs (5 mg total) into the vein once.  100 mL 0   No facility-administered medications prior to visit.         Objective:   Physical Exam Vitals:   05/03/16 0858  BP: 118/84  Pulse: 75  SpO2: 98%  Weight: 146 lb 9.6 oz (66.5 kg)  Height: 5\' 2"  (1.575 m)   Gen: Pleasant, well-nourished, in no distress,  normal affect  ENT: No lesions,  mouth clear,  oropharynx clear, no postnasal drip  Neck: No JVD, no stridor  Lungs: No use of accessory muscles, clear without rales or rhonchi  Cardiovascular: RRR, heart sounds normal, no murmur or gallops, no peripheral edema  Musculoskeletal: No deformities, no cyanosis or clubbing  Neuro: alert, non focal  Skin: Warm, no lesions or rashes      Assessment & Plan:  Dyspnea Dyspnea that is characterized by the sensation of inability to get an adequate deep breath. Her chest x-ray is normal, no evidence elevated HD, no evidence for a hiatal hernia. She does have some chest tightness - ? GERD although she denies, ? Anxiety. No wheeze on exam. Pulmonary function testing is normal. There is no vascular defect when her diffusion capacity is adjusted for her alveolar volume. I believe that she is experiencing some mild restriction due to some weight gain. I've encouraged her to continue to exercise, try to lose a few pounds that she has gained. Given her chest tightness it is reasonable to get the echocardiogram although I think her risk for coronary disease is low based on her history and her family history. I don't believe she needs any intervention at this time. If her symptoms evolve, change that I would like to see her again to decide whether any further workup is indicated including possible inspiratory and exp force (MIP, MEP) or cardiopulmonary exercise testing.  Baltazar Apo, MD, PhD 05/03/2016, 9:42 AM [facility] [phone] or if no answer [phone]

## 2016-05-03 NOTE — Patient Instructions (Signed)
Your breathing tests do not show any evidence for abnormal airflow, have normal volumes and normal adjusted diffusion capacity -- this is all good news, normal.  Agree with echocardiogram as planned, although believe that you are low risk for heart disease.  Continue to exercise, work on some slow steady weight loss.  Return to see Dr Lamonte Sakai if your breathing changes in any way. We will decide whether any further testing is indicated.

## 2016-05-09 ENCOUNTER — Ambulatory Visit (INDEPENDENT_AMBULATORY_CARE_PROVIDER_SITE_OTHER): Payer: Medicare Other | Admitting: Endocrinology

## 2016-05-09 DIAGNOSIS — M810 Age-related osteoporosis without current pathological fracture: Secondary | ICD-10-CM | POA: Diagnosis not present

## 2016-05-18 ENCOUNTER — Telehealth (HOSPITAL_COMMUNITY): Payer: Self-pay | Admitting: *Deleted

## 2016-05-18 NOTE — Telephone Encounter (Signed)
Patient given detailed instructions per Stress Test Requisition Sheet for test on 05/23/16 at 2:30.Patient Notified to arrive 30 minutes early, and that it is imperative to arrive on time for appointment to keep from having the test rescheduled.  Patient verbalized understanding. Veronia Beets

## 2016-05-21 NOTE — Progress Notes (Signed)
Per Dr Ronnie Derby note on 04/24/16, and after the patient signed the consent, an IV was inserted into the patient's left antecubital space with a 20G needle.  Some Normal saline was infused at first to determine if the needle was correctly placed, and then 5mg . Of Reclast was started at 4:25PM and infused until 5PM.  Pt. Reported no discomfort during the infusion.  The IV was flushed with Normal Saline, and then  D/C.  The site showed no signs of redness, or infiltration.   She was encouraged to drink plenty of fluids today, and to continue to take calcium and vit.D as directed by Dr, Dwyane Dee She had no final questions.  Patient treatment report reviewed and agreed with procedure description  Elayne Snare

## 2016-05-21 NOTE — Patient Instructions (Signed)
Drink plenty of fluids today Continue to take calcium and vit. D per Dr. Ronnie Derby instruction

## 2016-05-23 ENCOUNTER — Ambulatory Visit (HOSPITAL_BASED_OUTPATIENT_CLINIC_OR_DEPARTMENT_OTHER): Payer: Medicare Other

## 2016-05-23 ENCOUNTER — Ambulatory Visit (HOSPITAL_COMMUNITY): Payer: Medicare Other | Attending: Cardiology

## 2016-05-23 DIAGNOSIS — R0989 Other specified symptoms and signs involving the circulatory and respiratory systems: Secondary | ICD-10-CM

## 2016-05-23 DIAGNOSIS — R0600 Dyspnea, unspecified: Secondary | ICD-10-CM | POA: Insufficient documentation

## 2016-05-23 DIAGNOSIS — I10 Essential (primary) hypertension: Secondary | ICD-10-CM | POA: Insufficient documentation

## 2016-05-24 ENCOUNTER — Other Ambulatory Visit: Payer: Medicare Other

## 2016-06-13 DIAGNOSIS — M170 Bilateral primary osteoarthritis of knee: Secondary | ICD-10-CM | POA: Diagnosis not present

## 2016-06-13 DIAGNOSIS — R262 Difficulty in walking, not elsewhere classified: Secondary | ICD-10-CM | POA: Diagnosis not present

## 2016-06-13 DIAGNOSIS — M25561 Pain in right knee: Secondary | ICD-10-CM | POA: Diagnosis not present

## 2016-06-13 DIAGNOSIS — M25562 Pain in left knee: Secondary | ICD-10-CM | POA: Diagnosis not present

## 2016-06-23 ENCOUNTER — Other Ambulatory Visit: Payer: Self-pay | Admitting: Family Medicine

## 2016-06-23 DIAGNOSIS — M542 Cervicalgia: Secondary | ICD-10-CM

## 2016-07-27 ENCOUNTER — Encounter: Payer: Self-pay | Admitting: Family Medicine

## 2016-07-27 ENCOUNTER — Telehealth: Payer: Self-pay | Admitting: Family Medicine

## 2016-07-27 ENCOUNTER — Ambulatory Visit (INDEPENDENT_AMBULATORY_CARE_PROVIDER_SITE_OTHER): Payer: Medicare Other | Admitting: Family Medicine

## 2016-07-27 VITALS — BP 128/82 | HR 85 | Temp 98.0°F | Ht 62.0 in | Wt 148.4 lb

## 2016-07-27 DIAGNOSIS — L255 Unspecified contact dermatitis due to plants, except food: Secondary | ICD-10-CM | POA: Diagnosis not present

## 2016-07-27 MED ORDER — PREDNISONE 10 MG PO TABS: ORAL_TABLET | ORAL | 0 refills | Status: DC

## 2016-07-27 NOTE — Telephone Encounter (Signed)
Error/njr °

## 2016-07-27 NOTE — Progress Notes (Signed)
HPI:  Acute visit for "poison ivy" -hx remote poison ivy reaction -did some weeding 2 weeks ago then developed itchy vesiculopapular rash that has spread to involve arms, back, legs, R buttock, chin -topical otc creams help some -wants prednisone -no SOb, difficulty breathing, throat swelling, lesions on mucus membranes  ROS: See pertinent positives and negatives per HPI.  Past Medical History:  Diagnosis Date  . Anxiety   . Benign hypertension   . 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 Candidiasis of mouth   . 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 Fibromyalgia   . GERD (gastroesophageal reflux disease)   . Hematochezia 02/13/2012  . Left sided abdominal pain 02/13/2012  . Osteoporosis, unspecified 07/30/2012    Past Surgical History:  Procedure Laterality Date  . ABDOMINAL HYSTERECTOMY    . BREAST LUMPECTOMY Right   . FOOT SURGERY Bilateral   . TONSILLECTOMY      Family History  Problem Relation Age of Onset  . Hypertension Father   . Lung cancer Father   . Liver cancer Father   . Epilepsy Father   . Lung cancer Mother   . Hypertension Sister   . Osteoporosis Sister   . Hypertension Brother   . Arthritis Brother   . Diabetes Neg Hx     Social History   Social History  . Marital status: Widowed    Spouse name: N/A  . Number of children: 2  . Years of education: N/A   Occupational History  . [place] History Main Topics  . Smoking status: Never Smoker  . Smokeless tobacco: Never Used  . Alcohol use No  . Drug use: No  . Sexual activity: No   Other Topics Concern  . None   Social History Narrative   Work or School: [employer]      Home Situation: lives with husband      Spiritual Beliefs: Baptist      Lifestyle: no regular exercise; diet is ok                 Current Outpatient Prescriptions:  .  CALCIUM PO, Take by mouth., Disp: , Rfl:  .  Cholecalciferol (VITAMIN  D3) 2000 UNITS TABS, Take 1 tablet by mouth daily., Disp: , Rfl:  .  gabapentin (NEURONTIN) 300 MG capsule, Take 2 capsules (600 mg total) by mouth 3 (three) times daily., Disp: 540 capsule, Rfl: 3 .  hydrochlorothiazide (HYDRODIURIL) 25 MG tablet, TAKE ONE TABLET BY MOUTH ONCE DAILY, Disp: 90 tablet, Rfl: 1 .  Krill Oil 300 MG CAPS, Take 1 capsule by mouth daily., Disp: , Rfl:  .  Multiple Vitamin (MULTIVITAMIN) tablet, Take 1 tablet by mouth daily., Disp: , Rfl:  .  Probiotic Product (PROBIOTIC PO), Take by mouth., Disp: , Rfl:  .  zoledronic acid (RECLAST) 5 MG/100ML SOLN injection, Inject 100 mLs (5 mg total) into the vein once., Disp: 100 mL, Rfl: 0 .  predniSONE (DELTASONE) 10 MG tablet, 5 tablets (50mg ) daily for 3 days, then 4 tablets (40 mg) daily for 3 days, then 3 tablets (30mg ) daily for 3 days, then 2 tablets (20 mg) daily for 3 days, then 1 tablet (10 mg) daily for 3 days., Disp: 45 tablet, Rfl: 0  EXAM:  Vitals:   07/27/16 1533  BP: 128/82  Pulse: 85  Temp: 98 F (36.7 C)    Body mass index is 27.14 kg/m.  GENERAL: vitals reviewed and listed above, alert, oriented, appears well hydrated and in no acute distress  HEENT: atraumatic, conjunttiva clear, no obvious abnormalities on inspection of external nose and ears  NECK: no obvious masses on inspection  LUNGS: clear to auscultation bilaterally, no wheezes, rales or rhonchi, good air movement  CV: HRRR, no peripheral edema  SKIM: scattered patches of erythematous papules and excoriations on arms, buttock, chin, back  MS: moves all extremities without noticeable abnormality  PSYCH: pleasant and cooperative, no obvious depression or anxiety  ASSESSMENT AND PLAN:  Discussed the following assessment and plan:  Toxicodendron dermatitis  -discussed tx options and risks -she wants to do prednisone taper - reasonable given widespread involvement - risks discussed -Patient advised to return or notify a doctor  immediately if symptoms worsen or persist or new concerns arise.  Patient Instructions  Take the prednisone as instructed.   Poison Ivy Dermatitis Poison ivy dermatitis is redness and soreness (inflammation) of the skin. It is caused by a chemical that is found on the leaves of the poison ivy plant. You may also have itching, a rash, and blisters. Symptoms often clear up in 1-2 weeks. You may get this condition by touching a poison ivy plant. You can also get it by touching something that has the chemical on it. This may include animals or objects that have come in contact with the plant. Follow these instructions at home: General instructions  Take or apply over-the-counter and prescription medicines only as told by your doctor.  If you touch poison ivy, wash your skin with soap and cold water right away.  Use hydrocortisone creams or calamine lotion as needed to help with itching.  Take oatmeal baths as needed. Use colloidal oatmeal. You can get this at a pharmacy or grocery store. Follow the instructions on the package.  Do not scratch or rub your skin.  While you have the rash, wash your clothes right after you wear them. Prevention  Know what poison ivy looks like so you can avoid it. This plant has three leaves with flowering branches on a single stem. The leaves are glossy. They have uneven edges that come to a point at the front.  If you have touched poison ivy, wash with soap and water right away. Be sure to wash under your fingernails.  When hiking or camping, wear long pants, a long-sleeved shirt, tall socks, and hiking boots. You can also use a lotion on your skin that helps to prevent contact with the chemical on the plant.  If you think that your clothes or outdoor gear came in contact with poison ivy, rinse them off with a garden hose before you bring them inside your house. Contact a doctor if:  You have open sores in the rash area.  You have more redness,  swelling, or pain in the affected area.  You have redness that spreads beyond the rash area.  You have fluid, blood, or pus coming from the affected area.  You have a fever.  You have a rash over a large area of your body.  You have a rash on your eyes, mouth, or genitals.  Your rash does not get better after a few days. Get help right away if:  Your face swells or your eyes swell shut.  You have trouble breathing.  You have trouble swallowing. This information is not intended to replace advice given to you by your health care provider. Make sure you discuss any questions you have  with your health care provider. Document Released: 01/28/2010 Document Revised: 06/03/2015 Document Reviewed: 06/03/2014 Elsevier Interactive Patient Education  2018 [place]., DO

## 2016-07-27 NOTE — Patient Instructions (Signed)
Take the prednisone as instructed.   Poison Ivy Dermatitis Poison ivy dermatitis is redness and soreness (inflammation) of the skin. It is caused by a chemical that is found on the leaves of the poison ivy plant. You may also have itching, a rash, and blisters. Symptoms often clear up in 1-2 weeks. You may get this condition by touching a poison ivy plant. You can also get it by touching something that has the chemical on it. This may include animals or objects that have come in contact with the plant. Follow these instructions at home: General instructions  Take or apply over-the-counter and prescription medicines only as told by your doctor.  If you touch poison ivy, wash your skin with soap and cold water right away.  Use hydrocortisone creams or calamine lotion as needed to help with itching.  Take oatmeal baths as needed. Use colloidal oatmeal. You can get this at a pharmacy or grocery store. Follow the instructions on the package.  Do not scratch or rub your skin.  While you have the rash, wash your clothes right after you wear them. Prevention  Know what poison ivy looks like so you can avoid it. This plant has three leaves with flowering branches on a single stem. The leaves are glossy. They have uneven edges that come to a point at the front.  If you have touched poison ivy, wash with soap and water right away. Be sure to wash under your fingernails.  When hiking or camping, wear long pants, a long-sleeved shirt, tall socks, and hiking boots. You can also use a lotion on your skin that helps to prevent contact with the chemical on the plant.  If you think that your clothes or outdoor gear came in contact with poison ivy, rinse them off with a garden hose before you bring them inside your house. Contact a doctor if:  You have open sores in the rash area.  You have more redness, swelling, or pain in the affected area.  You have redness that spreads beyond the rash  area.  You have fluid, blood, or pus coming from the affected area.  You have a fever.  You have a rash over a large area of your body.  You have a rash on your eyes, mouth, or genitals.  Your rash does not get better after a few days. Get help right away if:  Your face swells or your eyes swell shut.  You have trouble breathing.  You have trouble swallowing. This information is not intended to replace advice given to you by your health care provider. Make sure you discuss any questions you have with your health care provider. Document Released: 01/28/2010 Document Revised: 06/03/2015 Document Reviewed: 06/03/2014 Elsevier Interactive Patient Education  [employer].

## 2016-08-29 ENCOUNTER — Ambulatory Visit: Payer: Medicare Other | Admitting: Neurology

## 2016-09-18 ENCOUNTER — Encounter: Payer: Self-pay | Admitting: Neurology

## 2016-09-18 ENCOUNTER — Ambulatory Visit (INDEPENDENT_AMBULATORY_CARE_PROVIDER_SITE_OTHER): Payer: Medicare Other | Admitting: Neurology

## 2016-09-18 VITALS — BP 108/66 | HR 77 | Ht 62.0 in | Wt 128.8 lb

## 2016-09-18 DIAGNOSIS — K146 Glossodynia: Secondary | ICD-10-CM | POA: Diagnosis not present

## 2016-09-18 DIAGNOSIS — G2581 Restless legs syndrome: Secondary | ICD-10-CM

## 2016-09-18 DIAGNOSIS — M792 Neuralgia and neuritis, unspecified: Secondary | ICD-10-CM | POA: Diagnosis not present

## 2016-09-18 NOTE — Patient Instructions (Addendum)
1.  Change the gabapentin 300mg  capsules to 3 capsules in afternoon and 3 capsules at bedtime.   2.  We will check a ferritin and B12 level. 3.  Follow up in 4 months.

## 2016-09-18 NOTE — Progress Notes (Signed)
NEUROLOGY FOLLOW UP OFFICE NOTE  [name] [id number]  HISTORY OF PRESENT ILLNESS: Dawn Reyes is a 66 year old woman with hypertension, osteoarthritis, depression and fibromyalgia who follows up for burning mouth syndrome and now restless leg syndrome.   UPDATE: Currently taking gabapentin 600mg  three times daily.   Burning mouth is controlled.  However, she would like to discuss another issue, restless leg.  She has had it for several years.  It occurs in the evening and in bed.  When she is in bed or sitting still watching TV, she would experience discomfort in the legs that is relieved by movement.   HISTORY: Onset of symptoms occurred in 2013. The symptoms correlate soon after the dental procedure involving 2 canals of 2 of her right-sided upper teeth. She did see the dentist for this, who said that her symptoms are unlikely related.  She notes a "discomfort "on the left side of her face, involving the maxilla to the temple. It is not really a pain. However, she will occasionally have a brief shooting sensation, about a 5/10.  It is sensitive to the touch.  It appears to be sensitive when she is putting on her makeup. She is never in extreme pain however. She also has a burning sensation of her tongue. This also is a 5/10 in intensity. It is less painful when she is eating or drinking.  Lidocaine mouth rinse and ice provide brief relief  PAST MEDICAL HISTORY: Past Medical History:  Diagnosis Date  . Anxiety   . Benign hypertension   . 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 Candidiasis of mouth   . 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 Fibromyalgia   . GERD (gastroesophageal reflux disease)   . Hematochezia 02/13/2012  . Left sided abdominal pain 02/13/2012  . Osteoporosis, unspecified 07/30/2012    MEDICATIONS: Current Outpatient Prescriptions on File Prior to Visit  Medication Sig Dispense Refill  . CALCIUM PO Take by mouth.    .  Cholecalciferol (VITAMIN D3) 2000 UNITS TABS Take 1 tablet by mouth daily.    Marland Kitchen gabapentin (NEURONTIN) 300 MG capsule Take 2 capsules (600 mg total) by mouth 3 (three) times daily. 540 capsule 3  . hydrochlorothiazide (HYDRODIURIL) 25 MG tablet TAKE ONE TABLET BY MOUTH ONCE DAILY 90 tablet 1  . Krill Oil 300 MG CAPS Take 1 capsule by mouth daily.    . Multiple Vitamin (MULTIVITAMIN) tablet Take 1 tablet by mouth daily.    . predniSONE (DELTASONE) 10 MG tablet 5 tablets (50mg ) daily for 3 days, then 4 tablets (40 mg) daily for 3 days, then 3 tablets (30mg ) daily for 3 days, then 2 tablets (20 mg) daily for 3 days, then 1 tablet (10 mg) daily for 3 days. 45 tablet 0  . Probiotic Product (PROBIOTIC PO) Take by mouth.    . zoledronic acid (RECLAST) 5 MG/100ML SOLN injection Inject 100 mLs (5 mg total) into the vein once. 100 mL 0   No current facility-administered medications on file prior to visit.     ALLERGIES: Allergies  Allergen Reactions  . Vicodin [Hydrocodone-Acetaminophen] Itching    FAMILY HISTORY: Family History  Problem Relation Age of Onset  . Hypertension Father   . Lung cancer Father   . Liver cancer Father   . Epilepsy Father   . Lung cancer Mother   . Hypertension Sister   . Osteoporosis Sister   . Hypertension Brother   . Arthritis  Brother   . Diabetes Neg Hx     SOCIAL HISTORY: Social History   Social History  . Marital status: Widowed    Spouse name: N/A  . Number of children: 2  . Years of education: N/A   Occupational History  . [place] History Main Topics  . Smoking status: Never Smoker  . Smokeless tobacco: Never Used  . Alcohol use No  . Drug use: No  . Sexual activity: No   Other Topics Concern  . Not on file   Social History Narrative   Work or School: [employer]      Home Situation: lives with husband      Spiritual Beliefs: Baptist      Lifestyle: no regular exercise; diet is ok                 REVIEW OF SYSTEMS: Constitutional: No fevers, chills, or sweats, no generalized fatigue, change in appetite Eyes: No visual changes, double vision, eye pain Ear, nose and throat: No hearing loss, ear pain, nasal congestion, sore throat Cardiovascular: No chest pain, palpitations Respiratory:  No shortness of breath at rest or with exertion, wheezes GastrointestinaI: No nausea, vomiting, diarrhea, abdominal pain, fecal incontinence Genitourinary:  No dysuria, urinary retention or frequency Musculoskeletal:  No neck pain, back pain Integumentary: No rash, pruritus, skin lesions Neurological: as above Psychiatric: No depression, insomnia, anxiety Endocrine: No palpitations, fatigue, diaphoresis, mood swings, change in appetite, change in weight, increased thirst Hematologic/Lymphatic:  No purpura, petechiae. Allergic/Immunologic: no itchy/runny eyes, nasal congestion, recent allergic reactions, rashes  PHYSICAL EXAM: Vitals:   09/18/16 0932  BP: 108/66  Pulse: 77  SpO2: 96%   General: No acute distress.  Patient appears well-groomed.  normal body habitus. Head:  Normocephalic/atraumatic Eyes:  Fundi examined but not visualized Neck: supple, no paraspinal tenderness, full range of motion Heart:  Regular rate and rhythm Lungs:  Clear to auscultation bilaterally Back: No paraspinal tenderness Neurological Exam: alert and oriented to person, place, and time. Attention span and concentration intact, recent and remote memory intact, fund of knowledge intact.  Speech fluent and not dysarthric, language intact.  CN II-XII intact. Bulk and tone normal, muscle strength 5/5 throughout.  Sensation to light touch, temperature and vibration intact.  Deep tendon reflexes 2+ throughout, toes downgoing.  Finger to nose and heel to shin testing intact.  Gait normal, Romberg negative.  IMPRESSION: Restless leg syndrome Burning mouth syndrome  PLAN: 1.  She will adjust dosing of gabapentin to  900mg  in afternoon and 900mg  at bedtime. 2.  Check B12 and ferritin level.  Treating with iron supplements with ferritin level goal above 50 may be therapeutic for restless leg syndrome. 3.  Other options include supplements (Calm Legs) or soap under the top at the foot of the bed. 4.  Follow up in 4 months.  18 minutes spent face to face with patient, over 50% spent discussing diagnosis and management.  Metta Clines, DO  CC:  Colin Benton, DO

## 2016-09-19 ENCOUNTER — Other Ambulatory Visit: Payer: Self-pay

## 2016-09-19 ENCOUNTER — Telehealth: Payer: Self-pay | Admitting: Neurology

## 2016-09-19 MED ORDER — GABAPENTIN 300 MG PO CAPS: ORAL_CAPSULE | ORAL | 3 refills | Status: DC

## 2016-09-19 NOTE — Telephone Encounter (Signed)
Pt called and would like her prescription for Neurontin called in to [facility]

## 2016-09-25 ENCOUNTER — Telehealth: Payer: Self-pay | Admitting: Neurology

## 2016-09-25 ENCOUNTER — Telehealth: Payer: Self-pay

## 2016-09-25 NOTE — Telephone Encounter (Signed)
-----   Message from Pieter Partridge, DO sent at 09/22/2016  7:23 AM EDT ----- Labs look okay (B12 was 430 and ferritin level was 58).  For restless leg syndrome, we can start pramipexole (Mirapex) 0.125mg  2-3 hours before bedtime.  If not improved in one week, she may double the dose (total of 0.25mg ).  Side effects may include vivid dreams and sometimes has been known to cause compulsive behavior, so she should let us know if she experiences these symptoms.  However, this is a very common medication to treat restless leg.

## 2016-09-25 NOTE — Telephone Encounter (Signed)
Pt left a message she was returning your call

## 2016-09-25 NOTE — Telephone Encounter (Signed)
Called Pt, advsd of lab results, offered to send in Mirapex, Pt states since increasing gabapentin, RLS is somewhat better, would like to hold off on addition of Mirapex for now. She will call back if changes her mind.

## 2016-09-25 NOTE — Telephone Encounter (Signed)
LM for Pt to rtrn my call

## 2016-09-28 ENCOUNTER — Encounter: Payer: Self-pay | Admitting: Family Medicine

## 2016-12-21 ENCOUNTER — Encounter: Payer: Self-pay | Admitting: Adult Health

## 2016-12-21 ENCOUNTER — Ambulatory Visit (INDEPENDENT_AMBULATORY_CARE_PROVIDER_SITE_OTHER): Payer: Medicare Other | Admitting: Adult Health

## 2016-12-21 VITALS — BP 138/86 | HR 68 | Temp 97.6°F | Wt 145.0 lb

## 2016-12-21 DIAGNOSIS — M5431 Sciatica, right side: Secondary | ICD-10-CM

## 2016-12-21 MED ORDER — CYCLOBENZAPRINE HCL 10 MG PO TABS: 10.0000 mg | ORAL_TABLET | Freq: Three times a day (TID) | ORAL | 0 refills | Status: DC | PRN

## 2016-12-21 MED ORDER — METHYLPREDNISOLONE 4 MG PO TBPK: ORAL_TABLET | ORAL | 0 refills | Status: DC

## 2016-12-21 NOTE — Progress Notes (Signed)
Subjective:    Patient ID: Dawn Reyes, female    DOB: August 16, 1950, 66 y.o.   MRN: [id number]  HPI  66 year old female who  has a past medical history of Anxiety, Benign hypertension, Burning tongue syndrome (02/06/2013), Candidiasis of mouth, Depression and Anxiety - 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 (07/16/2012), Fibromyalgia, GERD (gastroesophageal reflux disease), Hematochezia (02/13/2012), Left sided abdominal pain (02/13/2012), and Osteoporosis, unspecified (07/30/2012). She is a patient of Dr. Maudie Mercury who I am seeing today for the issue of right hip pain.   She is a very active person who reports that about 4 days ago she was playing with her dog, she did not have any pain that day. When she awoke the next morning she reports that she started to have mild right hip pain, the next day the pain become worse, but yesterday the pain had improved. When she woke up this morning the pain was so bad that it took her over an hour to get out of bed. The pain is described as " a stabbing pain" that radiates from her right lower back/buttock down the outside of her right leg. Pain stops at the knee. She reports worse pain with walking and sitting.   She denies any trauma, falls, or surgery on the right hip. She has not noticed any rash, bruising,  lower extremity edema, cp, or palpations.   Standing make the pain better.   She has been using Advil with minimal relief.   Review of Systems See HPI   Past Medical History:  Diagnosis Date  . Anxiety   . Benign hypertension   . 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 Candidiasis of mouth   . 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 Fibromyalgia   . GERD (gastroesophageal reflux disease)   . Hematochezia 02/13/2012  . Left sided abdominal pain 02/13/2012  . Osteoporosis, unspecified 07/30/2012    Social History   Socioeconomic History  . Marital status: Widowed    Spouse name: Not on file  . Number of children: 2  . Years  of education: Not on file  . Highest education level: Not on file  Social Needs  . Financial resource strain: Not on file  . Food insecurity - worry: Not on file  . Food insecurity - inability: Not on file  . Transportation needs - medical: Not on file  . Transportation needs - non-medical: Not on file  Occupational History  . Occupation: CSR    Employer: [employer]  Tobacco Use  . Smoking status: Never Smoker  . Smokeless tobacco: Never Used  Substance and Sexual Activity  . Alcohol use: No    Alcohol/week: 0.0 oz  . Drug use: No  . Sexual activity: No  Other Topics Concern  . Not on file  Social History Narrative   Work or School: [employer]      Home Situation: lives with husband      Spiritual Beliefs: Baptist      Lifestyle: no regular exercise; diet is ok                Past Surgical History:  Procedure Laterality Date  . ABDOMINAL HYSTERECTOMY    . BREAST LUMPECTOMY Right   . FOOT SURGERY Bilateral   . TONSILLECTOMY      Family History  Problem Relation Age of Onset  . Hypertension Father   . Lung cancer Father   .  Liver cancer Father   . Epilepsy Father   . Lung cancer Mother   . Hypertension Sister   . Osteoporosis Sister   . Hypertension Brother   . Arthritis Brother   . Diabetes Neg Hx     Allergies  Allergen Reactions  . Vicodin [Hydrocodone-Acetaminophen] Itching    Current Outpatient Medications on File Prior to Visit  Medication Sig Dispense Refill  . CALCIUM PO Take by mouth.    . Cholecalciferol (VITAMIN D3) 2000 UNITS TABS Take 1 tablet by mouth daily.    Marland Kitchen gabapentin (NEURONTIN) 300 MG capsule Take 2 capsules (600 mg total) by mouth 3 (three) times daily. 540 capsule 3  . gabapentin (NEURONTIN) 300 MG capsule Take 3 capsules in the afternoon, and 3 capsules at bedtime 180 capsule 3  . hydrochlorothiazide (HYDRODIURIL) 25 MG tablet TAKE ONE TABLET BY MOUTH ONCE DAILY 90 tablet 1  . Krill Oil 300 MG CAPS  Take 1 capsule by mouth daily.    . Multiple Vitamin (MULTIVITAMIN) tablet Take 1 tablet by mouth daily.    . Probiotic Product (PROBIOTIC PO) Take by mouth.    . zoledronic acid (RECLAST) 5 MG/100ML SOLN injection Inject 100 mLs (5 mg total) into the vein once. 100 mL 0   No current facility-administered medications on file prior to visit.     BP 138/86 (BP Location: Left Arm, Patient Position: Standing, Cuff Size: Normal)   Pulse 68   Temp 97.6 F (36.4 C) (Oral)   Wt 145 lb (65.8 kg)   SpO2 96%   BMI 26.52 kg/m       Objective:   Physical Exam  Constitutional: She is oriented to person, place, and time. She appears well-developed and well-nourished. No distress.  Cardiovascular: Normal rate, regular rhythm, normal heart sounds and intact distal pulses. Exam reveals no gallop and no friction rub.  No murmur heard. Pulmonary/Chest: Effort normal and breath sounds normal. No respiratory distress. She has no wheezes. She has no rales. She exhibits no tenderness.  Musculoskeletal: She exhibits tenderness. She exhibits no edema or deformity.  Tenderness with palpation to right buttock and right lateral thigh. Has pain with shifting gait.   She is walking with a walker to help with discomfort  Neurological: She is alert and oriented to person, place, and time.  Skin: Skin is warm and dry. No rash noted. She is not diaphoretic. No erythema. No pallor.  No rash or bruising noted. No signs of trauma   Psychiatric: She has a normal mood and affect. Her behavior is normal. Judgment and thought content normal.  Nursing note and vitals reviewed.     Assessment & Plan:  1. Sciatica of right side - Exam consistent with Sciatica. Will prescribe medrol dose pack and muscle relaxer.  - Advised heating pad and Motrin 600 mg Q6-8 H PRN  - methylPREDNISolone (MEDROL DOSEPAK) 4 MG TBPK tablet; Takes as directed  Dispense: 21 tablet; Refill: 0 - cyclobenzaprine (FLEXERIL) 10 MG tablet; Take 1  tablet (10 mg total) by mouth 3 (three) times daily as needed for muscle spasms.  Dispense: 30 tablet; Refill: 0 - Advised to follow up in 2-3 days if no improvement  - Consider Imaging at that point   Dorothyann Peng, NP

## 2017-01-18 ENCOUNTER — Encounter: Payer: Self-pay | Admitting: Family Medicine

## 2017-01-22 ENCOUNTER — Ambulatory Visit: Payer: Medicare Other | Admitting: Neurology

## 2017-02-16 ENCOUNTER — Other Ambulatory Visit: Payer: Self-pay | Admitting: Family Medicine

## 2017-02-16 DIAGNOSIS — Z1231 Encounter for screening mammogram for malignant neoplasm of breast: Secondary | ICD-10-CM

## 2017-02-27 DIAGNOSIS — L814 Other melanin hyperpigmentation: Secondary | ICD-10-CM | POA: Diagnosis not present

## 2017-02-27 DIAGNOSIS — Z86018 Personal history of other benign neoplasm: Secondary | ICD-10-CM | POA: Diagnosis not present

## 2017-02-27 DIAGNOSIS — Z23 Encounter for immunization: Secondary | ICD-10-CM | POA: Diagnosis not present

## 2017-02-27 DIAGNOSIS — D1801 Hemangioma of skin and subcutaneous tissue: Secondary | ICD-10-CM | POA: Diagnosis not present

## 2017-02-27 DIAGNOSIS — B001 Herpesviral vesicular dermatitis: Secondary | ICD-10-CM | POA: Diagnosis not present

## 2017-02-27 DIAGNOSIS — L821 Other seborrheic keratosis: Secondary | ICD-10-CM | POA: Diagnosis not present

## 2017-02-27 DIAGNOSIS — D225 Melanocytic nevi of trunk: Secondary | ICD-10-CM | POA: Diagnosis not present

## 2017-03-07 ENCOUNTER — Encounter: Payer: Self-pay | Admitting: Internal Medicine

## 2017-03-19 ENCOUNTER — Ambulatory Visit
Admission: RE | Admit: 2017-03-19 | Discharge: 2017-03-19 | Disposition: A | Discharge status: Home / Self Care | Payer: Medicare Other | Source: Ambulatory Visit | Attending: Family Medicine | Admitting: Family Medicine

## 2017-03-19 DIAGNOSIS — Z1231 Encounter for screening mammogram for malignant neoplasm of breast: Secondary | ICD-10-CM | POA: Diagnosis not present

## 2017-04-16 ENCOUNTER — Ambulatory Visit (INDEPENDENT_AMBULATORY_CARE_PROVIDER_SITE_OTHER): Payer: Medicare Other | Admitting: Family Medicine

## 2017-04-16 ENCOUNTER — Encounter: Payer: Self-pay | Admitting: Family Medicine

## 2017-04-16 VITALS — BP 120/72 | HR 81 | Temp 98.5°F | Ht 62.0 in | Wt 145.3 lb

## 2017-04-16 DIAGNOSIS — J329 Chronic sinusitis, unspecified: Secondary | ICD-10-CM | POA: Diagnosis not present

## 2017-04-16 MED ORDER — FLUTICASONE PROPIONATE 50 MCG/ACT NA SUSP: 2.0000 | Freq: Every day | NASAL | 6 refills | Status: DC

## 2017-04-16 MED ORDER — AMOXICILLIN-POT CLAVULANATE 875-125 MG PO TABS: 1.0000 | ORAL_TABLET | Freq: Two times a day (BID) | ORAL | 0 refills | Status: DC

## 2017-04-16 NOTE — Patient Instructions (Addendum)
-  start flonase 2 sprays each nostril daily for 1 month, then 1 spray each nostril daily  -nasal saline twice daily  -start the antibiotic (Augmentin) if worsening or not improving

## 2017-04-16 NOTE — Progress Notes (Signed)
HPI:  Using dictation device. Unfortunately this device frequently misinterprets words/phrases.   Acute visit for respiratory illness: -started:about 12 days ago -symptoms:nasal congestion, sore throat, cough, PMD - had a fever a few days into the illness that then resolved, some sinus discomfort and tooth pain this past week thicker sinus congestion -denies:fever, SOB, NVD, tooth pain -has tried: Antihistamine -sick contacts/travel/risks: no reported flu, strep or tick exposure -Hx of:  Seasonal allergiesROS: See pertinent positives and negatives per HPI.  Past Medical History:  Diagnosis Date  . Anxiety   . Benign hypertension   . 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 Candidiasis of mouth   . 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 Fibromyalgia   . GERD (gastroesophageal reflux disease)   . Hematochezia 02/13/2012  . Left sided abdominal pain 02/13/2012  . Osteoporosis, unspecified 07/30/2012    Past Surgical History:  Procedure Laterality Date  . ABDOMINAL HYSTERECTOMY    . BREAST LUMPECTOMY Right   . FOOT SURGERY Bilateral   . TONSILLECTOMY      Family History  Problem Relation Age of Onset  . Hypertension Father   . Lung cancer Father   . Liver cancer Father   . Epilepsy Father   . Lung cancer Mother   . Hypertension Sister   . Osteoporosis Sister   . Hypertension Brother   . Arthritis Brother   . Diabetes Neg Hx     Social History   Socioeconomic History  . Marital status: Widowed    Spouse name: Not on file  . Number of children: 2  . Years of education: Not on file  . Highest education level: Not on file  Occupational History  . Occupation: CSR    Employer: [employer]  Social Needs  . Financial resource strain: Not on file  . Food insecurity:    Worry: Not on file    Inability: Not on file  . Transportation needs:    Medical: Not on file    Non-medical: Not on file  Tobacco Use  . Smoking status:  Never Smoker  . Smokeless tobacco: Never Used  Substance and Sexual Activity  . Alcohol use: No    Alcohol/week: 0.0 oz  . Drug use: No  . Sexual activity: Never  Lifestyle  . Physical activity:    Days per week: Not on file    Minutes per session: Not on file  . Stress: Not on file  Relationships  . Social connections:    Talks on phone: Not on file    Gets together: Not on file    Attends religious service: Not on file    Active member of club or organization: Not on file    Attends meetings of clubs or organizations: Not on file    Relationship status: Not on file  Other Topics Concern  . Not on file  Social History Narrative   Work or School: [employer]      Home Situation: lives with husband      Spiritual Beliefs: Baptist      Lifestyle: no regular exercise; diet is ok                 Current Outpatient Medications:  .  CALCIUM PO, Take by mouth., Disp: , Rfl:  .  Cholecalciferol (VITAMIN D3) 2000 UNITS TABS, Take 1 tablet by mouth daily., Disp: , Rfl:  .  gabapentin (NEURONTIN) 300 MG capsule, Take  2 capsules (600 mg total) by mouth 3 (three) times daily., Disp: 540 capsule, Rfl: 3 .  hydrochlorothiazide (HYDRODIURIL) 25 MG tablet, TAKE ONE TABLET BY MOUTH ONCE DAILY, Disp: 90 tablet, Rfl: 1 .  Krill Oil 300 MG CAPS, Take 1 capsule by mouth daily., Disp: , Rfl:  .  Multiple Vitamin (MULTIVITAMIN) tablet, Take 1 tablet by mouth daily., Disp: , Rfl:  .  Red Yeast Rice Extract (RED YEAST RICE PO), Take 2,000 mg by mouth daily., Disp: , Rfl:  .  zoledronic acid (RECLAST) 5 MG/100ML SOLN injection, Inject 100 mLs (5 mg total) into the vein once., Disp: 100 mL, Rfl: 0 .  amoxicillin-clavulanate (AUGMENTIN) 875-125 MG tablet, Take 1 tablet by mouth 2 (two) times daily., Disp: 14 tablet, Rfl: 0 .  fluticasone (FLONASE) 50 MCG/ACT nasal spray, Place 2 sprays into both nostrils daily., Disp: 16 g, Rfl: 6  EXAM:  Vitals:   04/16/17 1128  BP: 120/72   Pulse: 81  Temp: 98.5 F (36.9 C)  SpO2: 98%    Body mass index is 26.58 kg/m.  GENERAL: vitals reviewed and listed above, alert, oriented, appears well hydrated and in no acute distress  HEENT: atraumatic, conjunttiva clear, no obvious abnormalities on inspection of external nose and ears, normal appearance of ear canals and TMs, clear nasal congestion, mild post oropharyngeal erythema with PND, no tonsillar edema or exudate, no sinus TTP  NECK: no obvious masses on inspection  LUNGS: clear to auscultation bilaterally, no wheezes, rales or rhonchi, good air movement  CV: HRRR, no peripheral edema  MS: moves all extremities without noticeable abnormality  PSYCH: pleasant and cooperative, no obvious depression or anxiety  ASSESSMENT AND PLAN:  Discussed the following assessment and plan:  Rhinosinusitis  -given HPI and exam findings today, a serious infection or illness is unlikely. We discussed potential etiologies, with VURI and/or allergic rhinitis likely with possible developing sinusitis.We discussed treatment side effects, likely course, antibiotic misuse, transmission, and signs of developing a serious illness. - opted to add Flonase, nasal saline and antibiotic if worsening or not improving, after discussion of risks.  -of course, we advised to return or notify a doctor immediately if symptoms worsen or persist or new concerns arise.    Patient Instructions  -start flonase 2 sprays each nostril daily for 1 month, then 1 spray each nostril daily  -nasal saline twice daily  -start the antibiotic (Augmentin) if worsening or not improving   We advise that you please obtain and provide [place] with the following so that we can provide you with the best care possible:  NOTE: please provide ONLY the requested documents and reserve a copy for yourself  -copies of all vaccines -reports from any heart studies, EKGs, xrays, MRIs, CT scans, ultrasounds or biopsies -any lab  tests done in the last 2 years -reports from any gastrointestinal studies such as colonoscopy or endoscopy -reports from any pap smears, mammograms or bone density tests -physical exam doctor notes from your last physical -the most recent office visit note from any specialist you have seen  Thank you.    Lucretia Kern, DO

## 2017-04-20 ENCOUNTER — Other Ambulatory Visit (INDEPENDENT_AMBULATORY_CARE_PROVIDER_SITE_OTHER): Payer: Medicare Other

## 2017-04-20 DIAGNOSIS — E559 Vitamin D deficiency, unspecified: Secondary | ICD-10-CM

## 2017-04-20 DIAGNOSIS — M810 Age-related osteoporosis without current pathological fracture: Secondary | ICD-10-CM | POA: Diagnosis not present

## 2017-04-20 LAB — COMPREHENSIVE METABOLIC PANEL
ALK PHOS: 65 U/L (ref 39–117)
ALT: 22 U/L (ref 0–35)
AST: 21 U/L (ref 0–37)
Albumin: 4.2 g/dL (ref 3.5–5.2)
BILIRUBIN TOTAL: 0.4 mg/dL (ref 0.2–1.2)
BUN: 9 mg/dL (ref 6–23)
CO2: 32 mEq/L (ref 19–32)
Calcium: 9.2 mg/dL (ref 8.4–10.5)
Chloride: 93 mEq/L — ABNORMAL LOW (ref 96–112)
Creatinine, Ser: 0.65 mg/dL (ref 0.40–1.20)
GFR: 96.47 mL/min (ref >60.00)
GLUCOSE: 87 mg/dL (ref 70–99)
Potassium: 4.4 mEq/L (ref 3.5–5.1)
SODIUM: 134 meq/L — AB (ref 135–145)
TOTAL PROTEIN: 7.1 g/dL (ref 6.0–8.3)

## 2017-04-20 LAB — VITAMIN D 25 HYDROXY (VIT D DEFICIENCY, FRACTURES): VITD: 42.61 ng/mL (ref 30.00–100.00)

## 2017-04-24 ENCOUNTER — Encounter: Payer: Self-pay | Admitting: Endocrinology

## 2017-04-24 ENCOUNTER — Ambulatory Visit (INDEPENDENT_AMBULATORY_CARE_PROVIDER_SITE_OTHER): Payer: Medicare Other | Admitting: Endocrinology

## 2017-04-24 VITALS — BP 136/70 | HR 62 | Ht 62.5 in | Wt 145.5 lb

## 2017-04-24 DIAGNOSIS — M810 Age-related osteoporosis without current pathological fracture: Secondary | ICD-10-CM | POA: Diagnosis not present

## 2017-04-24 NOTE — Progress Notes (Signed)
Patient ID: Dawn Reyes, female   DOB: [birth year], 67 y.o.   MRN: [id number]   History of Present Illness:  OSTEOPOROSIS:   Background history includes menopause at the usual age of about 40+.   She was told by her gynecologist in 2006 that she had osteopenia on screening and bone density.  She  had a T score of -2.0 at the right right neck of femur but her spine was normal At some point the patient was given a trial of Fosamax which she took for about 2 years but stopped it because of difficulty with lower chest and upper abdominal discomfort when she would take this She also tried this again in 2014 but could not tolerate it for longer than 6 months  Recent history: In 2011 she was found to have a T score of -2.5 at the hip and  subsequently this was -2.7 at the left hip in 2014 She was given Atelvia in 2014 and she took this for 3-4 months but since she could not get a refill on this he did not continue it and is here for further management She thinks she has lost only about a half an inch in height since her youth    She was given Reclast on 05/19/15 and repeat was done on 05/09/16 This was her third injection  She had no side effects with her infusions  She also has been taking the 2000 unit doses of vitamin D for the last few years and also calcium twice daily  She is now here for regular annual follow-up  No significant back pain and no recent history of fracture Her bone density in 2018 showed the following changes:  04/25/2016 Lumbar spine (L1-L4) Femoral neck (FN) 33% distal radius  T-score -0.6 RFN:-2.4 LFN:-2.4 n/a  Change in BMD from previous DXA test (%) Up 5.6% Up 3.5% n/a    Vitamin D levels:  Lab Results  Component Value Date   VD25OH 42.61 04/20/2017   VD25OH 36.31 04/19/2015   VD25OH 66 07/30/2012     Past Medical History:  Diagnosis Date  . Anxiety   . Benign hypertension   . 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 Candidiasis of mouth    . 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 Fibromyalgia   . GERD (gastroesophageal reflux disease)   . Hematochezia 02/13/2012  . Left sided abdominal pain 02/13/2012  . Osteoporosis, unspecified 07/30/2012    Past Surgical History:  Procedure Laterality Date  . ABDOMINAL HYSTERECTOMY    . BREAST LUMPECTOMY Right   . FOOT SURGERY Bilateral   . TONSILLECTOMY      Family History  Problem Relation Age of Onset  . Hypertension Father   . Lung cancer Father   . Liver cancer Father   . Epilepsy Father   . Lung cancer Mother   . Hypertension Sister   . Osteoporosis Sister   . Hypertension Brother   . Arthritis Brother   . Diabetes Neg Hx     Social History:  reports that she has never smoked. She has never used smokeless tobacco. She reports that she does not drink alcohol or use drugs.  Allergies:  Allergies  Allergen Reactions  . Vicodin [Hydrocodone-Acetaminophen] Itching    Allergies as of 04/24/2017      Reactions   Vicodin [hydrocodone-acetaminophen] Itching      Medication List        Accurate as of 04/24/17  10:03 AM. Always use your most recent med list.          CALCIUM PO Take by mouth.   fluticasone 50 MCG/ACT nasal spray Commonly known as:  FLONASE Place 2 sprays into both nostrils daily.   gabapentin 300 MG capsule Commonly known as:  NEURONTIN Take 2 capsules (600 mg total) by mouth 3 (three) times daily.   hydrochlorothiazide 25 MG tablet Commonly known as:  HYDRODIURIL TAKE ONE TABLET BY MOUTH ONCE DAILY   Krill Oil 300 MG Caps Take 1 capsule by mouth daily.   multivitamin tablet Take 1 tablet by mouth daily.   RED YEAST RICE PO Take 2,000 mg by mouth daily.   Vitamin D3 2000 units Tabs Take 1 tablet by mouth daily.   zoledronic acid 5 MG/100ML Soln injection Commonly known as:  RECLAST Inject 100 mLs (5 mg total) into the vein once.        REVIEW OF SYSTEMS:         She is scheduled to have  her physical soon with her PCP  Continues to be on HCTZ for hypertension and recently no hypokalemia      EXAM:  BP 136/70 (BP Location: Left Arm, Patient Position: Sitting, Cuff Size: Normal)   Pulse 62   Ht 5' 2.5" (1.588 m)   Wt 145 lb 8 oz (66 kg)   SpO2 96%   BMI 26.19 kg/m    ASSESSMENT:   History of osteoporosis, long-standing and clinically asymptomatic Has had only minimal height loss  She has mild osteoporosis at the left neck of the femur with T score -2.7 in 2014 which was improved to -2.4 in 2018 She has tolerated Reclast and is due to get another injection next month  No recent height loss and no history of recent fractures  Clinically she is doing well and is compliant with her vitamin D and calcium with adequate vitamin D levels   PLAN:   Needs to have repeat dose of Reclast next month, this will be scheduled today Current management and bone density results were discussed with patient  This will be continued for at least 3 more injections Follow-up annually  Vitamin D level is adequate and she will continue the same regimen unchanged along with calcium  Total visit time 15 minutes  Akshat Minehart 04/24/2017, 10:03 AM

## 2017-05-02 NOTE — Progress Notes (Signed)
Medicare Annual Preventive Care Visit  (initial annual wellness or annual wellness exam)  Concerns and/or follow up today:  PMH HTN, Anxiety, Depression, burning tongue syndrome, fibromyalgia, GERD, mild dyslipidemia, mild hyperglycemia and osteoporosis (seeing Dr. Dwyane Dee, endocrinology). Continues to see her endocrinologist and neurologist No depression currently. Feels well for the most part. Needs refill on hctz.  Active but no regular exercise. Has pain in R medial foot intermittently for a long time - this limits her from wanting to exercise as hurts more when walks.no weakness or numbness. Due for lipids, hgba1c, colon ca screening Refused pna vaccine in the past.  See HM section in Epic for other details of completed HM. See scanned documentation under Media Tab for further documentation HPI, health risk assessment. See Media Tab and Care Teams sections in Epic for other providers.  ROS: negative for report of fevers, unintentional weight loss, vision changes, vision loss, hearing loss or change, chest pain, sob, hemoptysis, melena, hematochezia, hematuria, genital discharge or lesions, falls, bleeding or bruising, loc, thoughts of suicide or self harm, change in memory loss  1.) Patient-completed health risk assessment  - completed and reviewed, see scanned documentation  2.) Review of Medical History: -PMH, PSH, Family History and current specialty and care providers reviewed and updated and listed below  - see scanned in document in chart and below  Past Medical History:  Diagnosis Date  . Anxiety   . Benign hypertension   . 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 Candidiasis of mouth   . 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 Fibromyalgia   . GERD (gastroesophageal reflux disease)   . Hematochezia 02/13/2012  . Left sided abdominal pain 02/13/2012  . Osteoporosis, unspecified 07/30/2012    Past Surgical History:  Procedure Laterality Date  .  ABDOMINAL HYSTERECTOMY    . BREAST LUMPECTOMY Right   . FOOT SURGERY Bilateral   . TONSILLECTOMY      Social History   Socioeconomic History  . Marital status: Widowed    Spouse name: Not on file  . Number of children: 2  . Years of education: Not on file  . Highest education level: Not on file  Occupational History  . Occupation: CSR    Employer: [employer]  Social Needs  . Financial resource strain: Not on file  . Food insecurity:    Worry: Not on file    Inability: Not on file  . Transportation needs:    Medical: Not on file    Non-medical: Not on file  Tobacco Use  . Smoking status: Never Smoker  . Smokeless tobacco: Never Used  Substance and Sexual Activity  . Alcohol use: No    Alcohol/week: 0.0 oz  . Drug use: No  . Sexual activity: Never  Lifestyle  . Physical activity:    Days per week: Not on file    Minutes per session: Not on file  . Stress: Not on file  Relationships  . Social connections:    Talks on phone: Not on file    Gets together: Not on file    Attends religious service: Not on file    Active member of club or organization: Not on file    Attends meetings of clubs or organizations: Not on file    Relationship status: Not on file  . Intimate partner violence:    Fear of current or ex partner: Not on file    Emotionally abused: Not on file  Physically abused: Not on file    Forced sexual activity: Not on file  Other Topics Concern  . Not on file  Social History Narrative   Work or School: [employer]      Home Situation: lives with husband      Spiritual Beliefs: Baptist      Lifestyle: no regular exercise; diet is ok                Family History  Problem Relation Age of Onset  . Hypertension Father   . Lung cancer Father   . Liver cancer Father   . Epilepsy Father   . Lung cancer Mother   . Hypertension Sister   . Osteoporosis Sister   . Hypertension Brother   . Arthritis Brother   . Diabetes  Neg Hx     Current Outpatient Medications on File Prior to Visit  Medication Sig Dispense Refill  . CALCIUM PO Take by mouth.    . Cholecalciferol (VITAMIN D3) 2000 UNITS TABS Take 1 tablet by mouth daily.    . fluticasone (FLONASE) 50 MCG/ACT nasal spray Place 2 sprays into both nostrils daily. (Patient taking differently: Place 2 sprays into both nostrils daily. ) 16 g 6  . gabapentin (NEURONTIN) 300 MG capsule Take 2 capsules (600 mg total) by mouth 3 (three) times daily. 540 capsule 3  . hydrochlorothiazide (HYDRODIURIL) 25 MG tablet TAKE ONE TABLET BY MOUTH ONCE DAILY 90 tablet 1  . Krill Oil 300 MG CAPS Take 1 capsule by mouth daily.    . Multiple Vitamin (MULTIVITAMIN) tablet Take 1 tablet by mouth daily.    . Red Yeast Rice Extract (RED YEAST RICE PO) Take 2,000 mg by mouth daily.    . zoledronic acid (RECLAST) 5 MG/100ML SOLN injection Inject 100 mLs (5 mg total) into the vein once. 100 mL 0   No current facility-administered medications on file prior to visit.      3.) Review of functional ability and level of safety:  Any difficulty hearing?  See scanned documentation  History of falling?  See scanned documentation  Any trouble with IADLs - using a phone, using transportation, grocery shopping, preparing meals, doing housework, doing laundry, taking medications and managing money?  See scanned documentation  Advance Directives?  Discussed briefly and offered more resources and detailed discussion with our trained staff.   See summary of recommendations in Patient Instructions below.  4.) Physical Exam Vitals:   05/03/17 0915  BP: (!) 100/58  Pulse: 73  Temp: 98.3 F (36.8 C)   Estimated body mass index is 26.59 kg/m as calculated from the following:   Height as of this encounter: 5' 1.75" (1.568 m).   Weight as of this encounter: 144 lb 3.2 oz (65.4 kg).  EKG (optional): deferred  General: alert, appear well hydrated and in no acute distress  HEENT:  visual acuity grossly intact  CV: HRRR  Lungs: CTA bilaterally  Psych: pleasant and cooperative, no obvious depression or anxiety  MD: TTP medial ankle tendons, mild collapse long arch with some pronation foot  Cognitive function grossly intact  See patient instructions for recommendations.  Education and counseling regarding the above review of health provided with a plan for the following: -see scanned patient completed form for further details -fall prevention strategies discussed  -healthy lifestyle discussed -importance and resources for completing advanced directives discussed -see patient instructions below for any other recommendations provided  4)The following written screening schedule of preventive  measures were reviewed with assessment and plan made per below, orders and patient instructions:      AAA screening done if applicable     Alcohol screening done     Obesity Screening and counseling done     STI screening (Hep C if born [birth year]) offered and per pt wishes     Tobacco Screening done done       Pneumococcal (PPSV23 -one dose after 64, one before if risk factors), influenza yearly and hepatitis B vaccines (if high risk - end stage renal disease, IV drugs, homosexual men, live in home for mentally retarded, hemophilia receiving factors) ASSESSMENT/PLAN: refused      Screening mammograph (yearly if >40) ASSESSMENT/PLAN: utd or ordered      Screening Pap smear/pelvic exam (q2 years) ASSESSMENT/PLAN: n/a, declined      Colorectal cancer screening (FOBT yearly or flex sig q4y or colonoscopy q10y or barium enema q4y) ASSESSMENT/PLAN:ordered - she wants to see Dr. Ardis Hughs because son with colon cancer screening see them      Diabetes outpatient self-management training services ASSESSMENT/PLAN: utd or done      Bone mass measurements(covered q2y if indicated - estrogen def, osteoporosis, hyperparathyroid, vertebral abnormalities, osteoporosis or  steroids) ASSESSMENT/PLAN: sees Dr. Dwyane Dee      Screening for glaucoma(q1y if high risk - diabetes, FH, AA and > 6 or hispanic and > 65) ASSESSMENT/PLAN: utd or advised      Medical nutritional therapy for individuals with diabetes or renal disease ASSESSMENT/PLAN: see orders      Cardiovascular screening blood tests (lipids q5y) ASSESSMENT/PLAN: see orders and labs      Diabetes screening tests ASSESSMENT/PLAN: see orders and labs   7.) Summary:  Medicare annual wellness visit, subsequent -risk factors and conditions per above assessment were discussed and treatment, recommendations and referrals were offered per documentation above and orders and patient instructions.  Screening for depression -negative  Essential hypertension, benign -labs per orders, had cmp recently and repeat pending -refills  Dyslipidemia -labs  Hyperglycemia Labs  Ankle pain: -R medial -discussed potential etiologies and treatment options -start with footwear, OMT, PT per her preference, offered referral as well, she will let us know Patient Instructions   BEFORE YOU LEAVE: -? prevnar 13 - she is considering - see if she wants -labs -follow up: 4 - 6 months  -We placed a referral for you as discussed to Dr. Ardis Hughs for your colon cancer screening. It usually takes about 1-2 weeks to process and schedule this referral. If you have not heard from [place] regarding this appointment in 2 weeks please contact our office.  -Try a gait analysis and proper footwear (fleet feet/off and running) for the foot pain. Also could consider osteopathic treatment and/or physical therapy or referral to foot specialist. Let us know what you prefer.  We have ordered labs or studies at this visit. It can take up to 1-2 weeks for results and processing. IF results require follow up or explanation, we will call you with instructions. Clinically stable results will be released to your [facility]. If you have not heard from [place]  or cannot find your results in [facility] in 2 weeks please contact our office at [phone].  If you are not yet signed up for [facility], please consider signing up.    Ms. Protzman , Thank you for taking time to come for your Medicare Wellness Visit. I appreciate your ongoing commitment to your health goals. Please review the following plan we discussed and let  me know if I can assist you in the future.   These are the goals we discussed: Goals    Colon cancer screening Regular aerobic exercise Pneumonia vaccines Healthy diet      This is a list of the screening recommended for you and due dates:  Health Maintenance  Topic Date Due  . Colon Cancer Screening  01/24/2017 - referral sent  . Pneumonia vaccines (1 of 2 - PCV13) Due - offered  . Flu Shot  08/09/2017  . Mammogram  03/20/2019  . Tetanus Vaccine  12/21/2022  . DEXA scan (bone density measurement)  Completed  .  Hepatitis C: One time screening is recommended by Center for Disease Control  (CDC) for  adults born from [birth year] through 1965.   Completed  *Topic was postponed. The date shown is not the original due date.   Preventive Care 35 Years and Older, Female Preventive care refers to lifestyle choices and visits with your health care provider that can promote health and wellness. What does preventive care include?  A yearly physical exam. This is also called an annual well check.  Dental exams once or twice a year.  Routine eye exams. Ask your health care provider how often you should have your eyes checked.  Personal lifestyle choices, including: ? Daily care of your teeth and gums. ? Regular physical activity. ? Eating a healthy diet. ? Avoiding tobacco and drug use. ? Limiting alcohol use. ? Practicing safe sex. ? Taking low-dose aspirin every day. ? Taking vitamin and mineral supplements as recommended by your health care provider. What happens during an annual well check? The services and screenings done by  your health care provider during your annual well check will depend on your age, overall health, lifestyle risk factors, and family history of disease. Counseling Your health care provider may ask you questions about your:  Alcohol use.  Tobacco use.  Drug use.  Emotional well-being.  Home and relationship well-being.  Sexual activity.  Eating habits.  History of falls.  Memory and ability to understand (cognition).  Work and work Statistician.  Reproductive health.  Screening You may have the following tests or measurements:  Height, weight, and BMI.  Blood pressure.  Lipid and cholesterol levels. These may be checked every 5 years, or more frequently if you are over 65 years old.  Skin check.  Lung cancer screening. You may have this screening every year starting at age 81 if you have a 30-pack-year history of smoking and currently smoke or have quit within the past 15 years.  Fecal occult blood test (FOBT) of the stool. You may have this test every year starting at age 63.  Flexible sigmoidoscopy or colonoscopy. You may have a sigmoidoscopy every 5 years or a colonoscopy every 10 years starting at age 68.  Hepatitis C blood test.  Hepatitis B blood test.  Sexually transmitted disease (STD) testing.  Diabetes screening. This is done by checking your blood sugar (glucose) after you have not eaten for a while (fasting). You may have this done every 1-3 years.  Bone density scan. This is done to screen for osteoporosis. You may have this done starting at age 73.  Mammogram. This may be done every 1-2 years. Talk to your health care provider about how often you should have regular mammograms.  Talk with your health care provider about your test results, treatment options, and if necessary, the need for more tests. Vaccines Your health care provider may recommend certain  vaccines, such as:  Influenza vaccine. This is recommended every year.  Tetanus,  diphtheria, and acellular pertussis (Tdap, Td) vaccine. You may need a Td booster every 10 years.  Varicella vaccine. You may need this if you have not been vaccinated.  Zoster vaccine. You may need this after age 85.  Measles, mumps, and rubella (MMR) vaccine. You may need at least one dose of MMR if you were born in 1957 or later. You may also need a second dose.  Pneumococcal 13-valent conjugate (PCV13) vaccine. One dose is recommended after age 38.  Pneumococcal polysaccharide (PPSV23) vaccine. One dose is recommended after age 64.  Meningococcal vaccine. You may need this if you have certain conditions.  Hepatitis A vaccine. You may need this if you have certain conditions or if you travel or work in places where you may be exposed to hepatitis A.  Hepatitis B vaccine. You may need this if you have certain conditions or if you travel or work in places where you may be exposed to hepatitis B.  Haemophilus influenzae type b (Hib) vaccine. You may need this if you have certain conditions.  Talk to your health care provider about which screenings and vaccines you need and how often you need them. This information is not intended to replace advice given to you by your health care provider. Make sure you discuss any questions you have with your health care provider. Document Released: 01/22/2015 Document Revised: 09/15/2015 Document Reviewed: 10/27/2014 Elsevier Interactive Patient Education  2018 [place], [place]

## 2017-05-03 ENCOUNTER — Encounter: Payer: Self-pay | Admitting: Neurology

## 2017-05-03 ENCOUNTER — Ambulatory Visit (INDEPENDENT_AMBULATORY_CARE_PROVIDER_SITE_OTHER): Payer: Medicare Other | Admitting: Family Medicine

## 2017-05-03 ENCOUNTER — Encounter: Payer: Self-pay | Admitting: Family Medicine

## 2017-05-03 VITALS — BP 100/58 | HR 73 | Temp 98.3°F | Ht 61.75 in | Wt 144.2 lb

## 2017-05-03 DIAGNOSIS — M542 Cervicalgia: Secondary | ICD-10-CM | POA: Diagnosis not present

## 2017-05-03 DIAGNOSIS — Z1331 Encounter for screening for depression: Secondary | ICD-10-CM

## 2017-05-03 DIAGNOSIS — R739 Hyperglycemia, unspecified: Secondary | ICD-10-CM

## 2017-05-03 DIAGNOSIS — I10 Essential (primary) hypertension: Secondary | ICD-10-CM | POA: Diagnosis not present

## 2017-05-03 DIAGNOSIS — E785 Hyperlipidemia, unspecified: Secondary | ICD-10-CM

## 2017-05-03 DIAGNOSIS — Z0000 Encounter for general adult medical examination without abnormal findings: Secondary | ICD-10-CM | POA: Diagnosis not present

## 2017-05-03 LAB — HEMOGLOBIN A1C: Hgb A1c MFr Bld: 5.7 % (ref 4.6–6.5)

## 2017-05-03 LAB — CBC
HCT: 38.1 % (ref 36.0–46.0)
Hemoglobin: 12.9 g/dL (ref 12.0–15.0)
MCHC: 33.9 g/dL (ref 30.0–36.0)
MCV: 94 fl (ref 78.0–100.0)
Platelets: 379 10*3/uL (ref 150.0–400.0)
RBC: 4.06 Mil/uL (ref 3.87–5.11)
RDW: 13.7 % (ref 11.5–15.5)
WBC: 4.6 10*3/uL (ref 4.0–10.5)

## 2017-05-03 LAB — LIPID PANEL
CHOL/HDL RATIO: 2
Cholesterol: 199 mg/dL (ref 0–200)
HDL: 84.7 mg/dL (ref >39.00)
LDL CALC: 97 mg/dL (ref 0–99)
NONHDL: 114.59
Triglycerides: 88 mg/dL (ref 0.0–149.0)
VLDL: 17.6 mg/dL (ref 0.0–40.0)

## 2017-05-03 MED ORDER — HYDROCHLOROTHIAZIDE 25 MG PO TABS: 25.0000 mg | ORAL_TABLET | Freq: Every day | ORAL | 1 refills | Status: DC

## 2017-05-03 NOTE — Patient Instructions (Signed)
BEFORE YOU LEAVE: -? prevnar 13 - she is considering - see if she wants -labs -follow up: 4 - 6 months  -We placed a referral for you as discussed to Dr. Ardis Hughs for your colon cancer screening. It usually takes about 1-2 weeks to process and schedule this referral. If you have not heard from [place] regarding this appointment in 2 weeks please contact our office.  -Try a gait analysis and proper footwear (fleet feet/off and running) for the foot pain. Also could consider osteopathic treatment and/or physical therapy or referral to foot specialist. Let us know what you prefer.  We have ordered labs or studies at this visit. It can take up to 1-2 weeks for results and processing. IF results require follow up or explanation, we will call you with instructions. Clinically stable results will be released to your [facility]. If you have not heard from [place] or cannot find your results in [facility] in 2 weeks please contact our office at [phone].  If you are not yet signed up for [facility], please consider signing up.    Dawn Reyes , Thank you for taking time to come for your Medicare Wellness Visit. I appreciate your ongoing commitment to your health goals. Please review the following plan we discussed and let me know if I can assist you in the future.   These are the goals we discussed: Goals    Colon cancer screening Regular aerobic exercise Pneumonia vaccines Healthy diet      This is a list of the screening recommended for you and due dates:  Health Maintenance  Topic Date Due  . Colon Cancer Screening  01/24/2017 - referral sent  . Pneumonia vaccines (1 of 2 - PCV13) Due - offered  . Flu Shot  08/09/2017  . Mammogram  03/20/2019  . Tetanus Vaccine  12/21/2022  . DEXA scan (bone density measurement)  Completed  .  Hepatitis C: One time screening is recommended by Center for Disease Control  (CDC) for  adults born from [birth year] through 1966.   Completed  *Topic was postponed. The date shown is not  the original due date.   Preventive Care 20 Years and Older, Female Preventive care refers to lifestyle choices and visits with your health care provider that can promote health and wellness. What does preventive care include?  A yearly physical exam. This is also called an annual well check.  Dental exams once or twice a year.  Routine eye exams. Ask your health care provider how often you should have your eyes checked.  Personal lifestyle choices, including: ? Daily care of your teeth and gums. ? Regular physical activity. ? Eating a healthy diet. ? Avoiding tobacco and drug use. ? Limiting alcohol use. ? Practicing safe sex. ? Taking low-dose aspirin every day. ? Taking vitamin and mineral supplements as recommended by your health care provider. What happens during an annual well check? The services and screenings done by your health care provider during your annual well check will depend on your age, overall health, lifestyle risk factors, and family history of disease. Counseling Your health care provider may ask you questions about your:  Alcohol use.  Tobacco use.  Drug use.  Emotional well-being.  Home and relationship well-being.  Sexual activity.  Eating habits.  History of falls.  Memory and ability to understand (cognition).  Work and work Statistician.  Reproductive health.  Screening You may have the following tests or measurements:  Height, weight, and BMI.  Blood pressure.  Lipid and cholesterol levels. These may be checked every 5 years, or more frequently if you are over 26 years old.  Skin check.  Lung cancer screening. You may have this screening every year starting at age 75 if you have a 30-pack-year history of smoking and currently smoke or have quit within the past 15 years.  Fecal occult blood test (FOBT) of the stool. You may have this test every year starting at age 66.  Flexible sigmoidoscopy or colonoscopy. You may have a  sigmoidoscopy every 5 years or a colonoscopy every 10 years starting at age 64.  Hepatitis C blood test.  Hepatitis B blood test.  Sexually transmitted disease (STD) testing.  Diabetes screening. This is done by checking your blood sugar (glucose) after you have not eaten for a while (fasting). You may have this done every 1-3 years.  Bone density scan. This is done to screen for osteoporosis. You may have this done starting at age 7.  Mammogram. This may be done every 1-2 years. Talk to your health care provider about how often you should have regular mammograms.  Talk with your health care provider about your test results, treatment options, and if necessary, the need for more tests. Vaccines Your health care provider may recommend certain vaccines, such as:  Influenza vaccine. This is recommended every year.  Tetanus, diphtheria, and acellular pertussis (Tdap, Td) vaccine. You may need a Td booster every 10 years.  Varicella vaccine. You may need this if you have not been vaccinated.  Zoster vaccine. You may need this after age 78.  Measles, mumps, and rubella (MMR) vaccine. You may need at least one dose of MMR if you were born in 1957 or later. You may also need a second dose.  Pneumococcal 13-valent conjugate (PCV13) vaccine. One dose is recommended after age 49.  Pneumococcal polysaccharide (PPSV23) vaccine. One dose is recommended after age 46.  Meningococcal vaccine. You may need this if you have certain conditions.  Hepatitis A vaccine. You may need this if you have certain conditions or if you travel or work in places where you may be exposed to hepatitis A.  Hepatitis B vaccine. You may need this if you have certain conditions or if you travel or work in places where you may be exposed to hepatitis B.  Haemophilus influenzae type b (Hib) vaccine. You may need this if you have certain conditions.  Talk to your health care provider about which screenings and  vaccines you need and how often you need them. This information is not intended to replace advice given to you by your health care provider. Make sure you discuss any questions you have with your health care provider. Document Released: 01/22/2015 Document Revised: 09/15/2015 Document Reviewed: 10/27/2014 Elsevier Interactive Patient Education  [employer].

## 2017-05-03 NOTE — Addendum Note (Signed)
Addended by: Agnes Lawrence on: 05/03/2017 10:12 AM   Modules accepted: Orders

## 2017-05-07 ENCOUNTER — Encounter: Payer: Self-pay | Admitting: Internal Medicine

## 2017-05-15 ENCOUNTER — Ambulatory Visit (INDEPENDENT_AMBULATORY_CARE_PROVIDER_SITE_OTHER): Payer: Medicare Other | Admitting: Endocrinology

## 2017-05-15 DIAGNOSIS — M810 Age-related osteoporosis without current pathological fracture: Secondary | ICD-10-CM | POA: Diagnosis not present

## 2017-05-17 NOTE — Progress Notes (Signed)
Per Dr. Ronnie Derby note on 04/24/17, and after patient signed to consent form, an IV was started in the patientsleft arm with a 20g needle. Normal saline was infused to determine that the IV was patent, and then 5mg  Reclast was started at 10:20AM, and infused until 10:50 AM. She reported no symptoms of discomfort during this process. We discussed the need to take Calcium and Vit. D as directed by Dr. Dwyane Dee, and she was encourage to drink at least 6 8oz glasses today. She agreed to do this. The IV was then flushed with more normal saline and then D/Ced. The site showed no signs of redness or swelling.   Procedure note reviewed, corrections made and accepted  Elayne Snare

## 2017-05-17 NOTE — Patient Instructions (Signed)
Drink at least 6 glasses of water today. Call if questions or concerns

## 2017-05-28 ENCOUNTER — Encounter: Payer: Self-pay | Admitting: Family Medicine

## 2017-05-28 ENCOUNTER — Ambulatory Visit (INDEPENDENT_AMBULATORY_CARE_PROVIDER_SITE_OTHER): Payer: Medicare Other | Admitting: Family Medicine

## 2017-05-28 VITALS — BP 110/70 | HR 81 | Temp 98.5°F | Wt 146.6 lb

## 2017-05-28 DIAGNOSIS — J029 Acute pharyngitis, unspecified: Secondary | ICD-10-CM

## 2017-05-28 NOTE — Progress Notes (Signed)
  Subjective:     Patient ID: Dawn Reyes, female   DOB: July 30, 1950, 67 y.o.   MRN: [id number]  HPI Patient is a nonsmoker who seen with over 3 week history of persistent very localized sore throat on the right side. She had tonsillectomy in childhood. She also complains right ear pain. He's had some mild postnasal drainage. No left-sided symptoms. Denies any fever. No cough. No active GERD symptoms. No appetite or weight changes.   She thinks she might have had some intermittent lymphadenopathy right side greater than left. Has been using saltwater gargles 3-4 times daily with no relief. Symptoms are worse at night  Patient has been taking some Flonase and Allegra daily without improvement. No history of any tobacco use whatsoever.  Past Medical History:  Diagnosis Date  . Anxiety   . Benign hypertension   . 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 Candidiasis of mouth   . 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 Fibromyalgia   . GERD (gastroesophageal reflux disease)   . Hematochezia 02/13/2012  . Left sided abdominal pain 02/13/2012  . Osteoporosis, unspecified 07/30/2012   Past Surgical History:  Procedure Laterality Date  . ABDOMINAL HYSTERECTOMY    . BREAST LUMPECTOMY Right   . FOOT SURGERY Bilateral   . TONSILLECTOMY      reports that she has never smoked. She has never used smokeless tobacco. She reports that she does not drink alcohol or use drugs. family history includes Arthritis in her brother; Epilepsy in her father; Hypertension in her brother, father, and sister; Liver cancer in her father; Lung cancer in her father and mother; Osteoporosis in her sister. Allergies  Allergen Reactions  . Vicodin [Hydrocodone-Acetaminophen] Itching     Review of Systems  Constitutional: Negative for appetite change, chills, fever and unexpected weight change.  HENT: Positive for postnasal drip and sore throat. Negative for sinus pressure, sinus pain,  trouble swallowing and voice change.   Respiratory: Negative for cough and shortness of breath.   Cardiovascular: Negative for chest pain.       Objective:   Physical Exam  Constitutional: She appears well-developed and well-nourished.  HENT:  Right Ear: Tympanic membrane and ear canal normal.  Left Ear: Tympanic membrane and ear canal normal.  Mouth/Throat: Uvula is midline and oropharynx is clear and moist. No oropharyngeal exudate or posterior oropharyngeal edema.  Patient's had previous tonsillectomy. She has some mild erythema right posterior pharynx near the base of the tongue-but no visible masses. No necrosis. No ulcerations noted. No thrush       Assessment:     Persistent unilateral (right-sided) sore throat over 3 weeks duration. She does not have any red flags such as appetite or weight change, unilateral adenopathy, or other worrisome finding. Only abnormality is some mild erythema at the base of the right tongue compared to left side    Plan:     -Set up ENT referral to further evaluate -Continue saltwater gargles in the meantime  Eulas Post MD [facility]

## 2017-05-28 NOTE — Patient Instructions (Signed)
We will set up ENT referral regarding persistant sore throat.

## 2017-06-20 ENCOUNTER — Ambulatory Visit (INDEPENDENT_AMBULATORY_CARE_PROVIDER_SITE_OTHER): Payer: Medicare Other | Admitting: Internal Medicine

## 2017-06-20 ENCOUNTER — Encounter: Payer: Self-pay | Admitting: Internal Medicine

## 2017-06-20 VITALS — BP 110/74 | HR 68 | Ht 62.0 in | Wt 147.0 lb

## 2017-06-20 DIAGNOSIS — Z86010 Personal history of colonic polyps: Secondary | ICD-10-CM

## 2017-06-20 DIAGNOSIS — Z800 Family history of malignant neoplasm of digestive organs: Secondary | ICD-10-CM

## 2017-06-20 DIAGNOSIS — K219 Gastro-esophageal reflux disease without esophagitis: Secondary | ICD-10-CM | POA: Diagnosis not present

## 2017-06-20 MED ORDER — PEG-KCL-NACL-NASULF-NA ASC-C 140 G PO SOLR: 1.0000 | Freq: Once | ORAL | 0 refills | Status: AC

## 2017-06-20 NOTE — Progress Notes (Signed)
HISTORY OF PRESENT ILLNESS:  Dawn Reyes is a 67 y.o. female , with a history of GERD and adenomatous colon polyps. She presents today for routine follow-up. In terms of GERD, she underwent EGD January 2014 to evaluate epigastric pain. This was normal. Since that time she has been adhering to reflux precautions. On demand acid suppressive medication. This has been a good strategy. No dysphagia. Next, she tells me that her son was diagnosed with colon cancer at age 58 2 years ago. The patient herself underwent routine screening colonoscopy January 2014. In addition to mild sigmoid diverticulosis she was found to have a diminutive cecal polyp which was removed and found to be a tubular adenoma. Follow-up in 5 years recommended. She did receive a recall letter. The patient's GI review of systems currently is otherwise negative. Her overall general health has been good  REVIEW OF SYSTEMS:  All non-GI ROS negative unless otherwise stated in history of present illness except for things I and depression  Past Medical History:  Diagnosis Date  . Anxiety   . Benign hypertension   . 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 Candidiasis of mouth   . 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 Fibromyalgia   . GERD (gastroesophageal reflux disease)   . Hematochezia 02/13/2012  . Left sided abdominal pain 02/13/2012  . Osteoporosis, unspecified 07/30/2012    Past Surgical History:  Procedure Laterality Date  . ABDOMINAL HYSTERECTOMY    . BREAST LUMPECTOMY Right   . FOOT SURGERY Bilateral   . TONSILLECTOMY      Social History Dawn Reyes  reports that she has never smoked. She has never used smokeless tobacco. She reports that she does not drink alcohol or use drugs.  family history includes Arthritis in her brother; Colon cancer (age of onset: 54) in her son; Epilepsy in her father; Hypertension in her brother, father, and sister; Liver cancer in her father; Lung cancer in  her father and mother; Osteoporosis in her sister.  Allergies  Allergen Reactions  . Vicodin [Hydrocodone-Acetaminophen] Itching       PHYSICAL EXAMINATION: Vital signs: BP 110/74   Pulse 68   Ht 5\' 2"  (1.575 m)   Wt 147 lb (66.7 kg)   BMI 26.89 kg/m   Constitutional: generally well-appearing, no acute distress Psychiatric: alert and oriented x3, cooperative Eyes: extraocular movements intact, anicteric, conjunctiva pink Mouth: oral pharynx moist, no lesions Neck: supple no lymphadenopathy Cardiovascular: heart regular rate and rhythm, no murmur Lungs: clear to auscultation bilaterally Abdomen: soft, nontender, nondistended, no obvious ascites, no peritoneal signs, normal bowel sounds, no organomegaly Rectal:deferred until colonoscopy Extremities: no clubbing, cyanosis, or lower extremity edema bilaterally Skin: no lesions on visible extremities Neuro: No focal deficits. Normal DTRs  ASSESSMENT:  #1. GERD. Normal EGD. Doing well with reflux precautions and on demand acid suppressive therapy #2. History of adenomatous colon polyps. Due for surveillance #3. History of colon cancer in her son at age 74. New   PLAN:  #1. Continue reflux precautions #2. Continue on demand acid suppressive therapy as needed #3. Schedule surveillance colonoscopy.The nature of the procedure, as well as the risks, benefits, and alternatives were carefully and thoroughly reviewed with the patient. Ample time for discussion and questions allowed. The patient understood, was satisfied, and agreed to proceed. #4. Advised that all first-degree relatives of her son be screened for colonoscopy if not yet done

## 2017-06-20 NOTE — Patient Instructions (Signed)

## 2017-08-15 IMAGING — DX DG CHEST 2V
2 series · 2 of 2 positions shown · non-contrast
Comparison: Scout image for CT scan dated 05/18/2006

CLINICAL DATA: Shortness of breath for 2 months.  Chest tightness.

EXAM:
CHEST  2 VIEW

[chest pa]
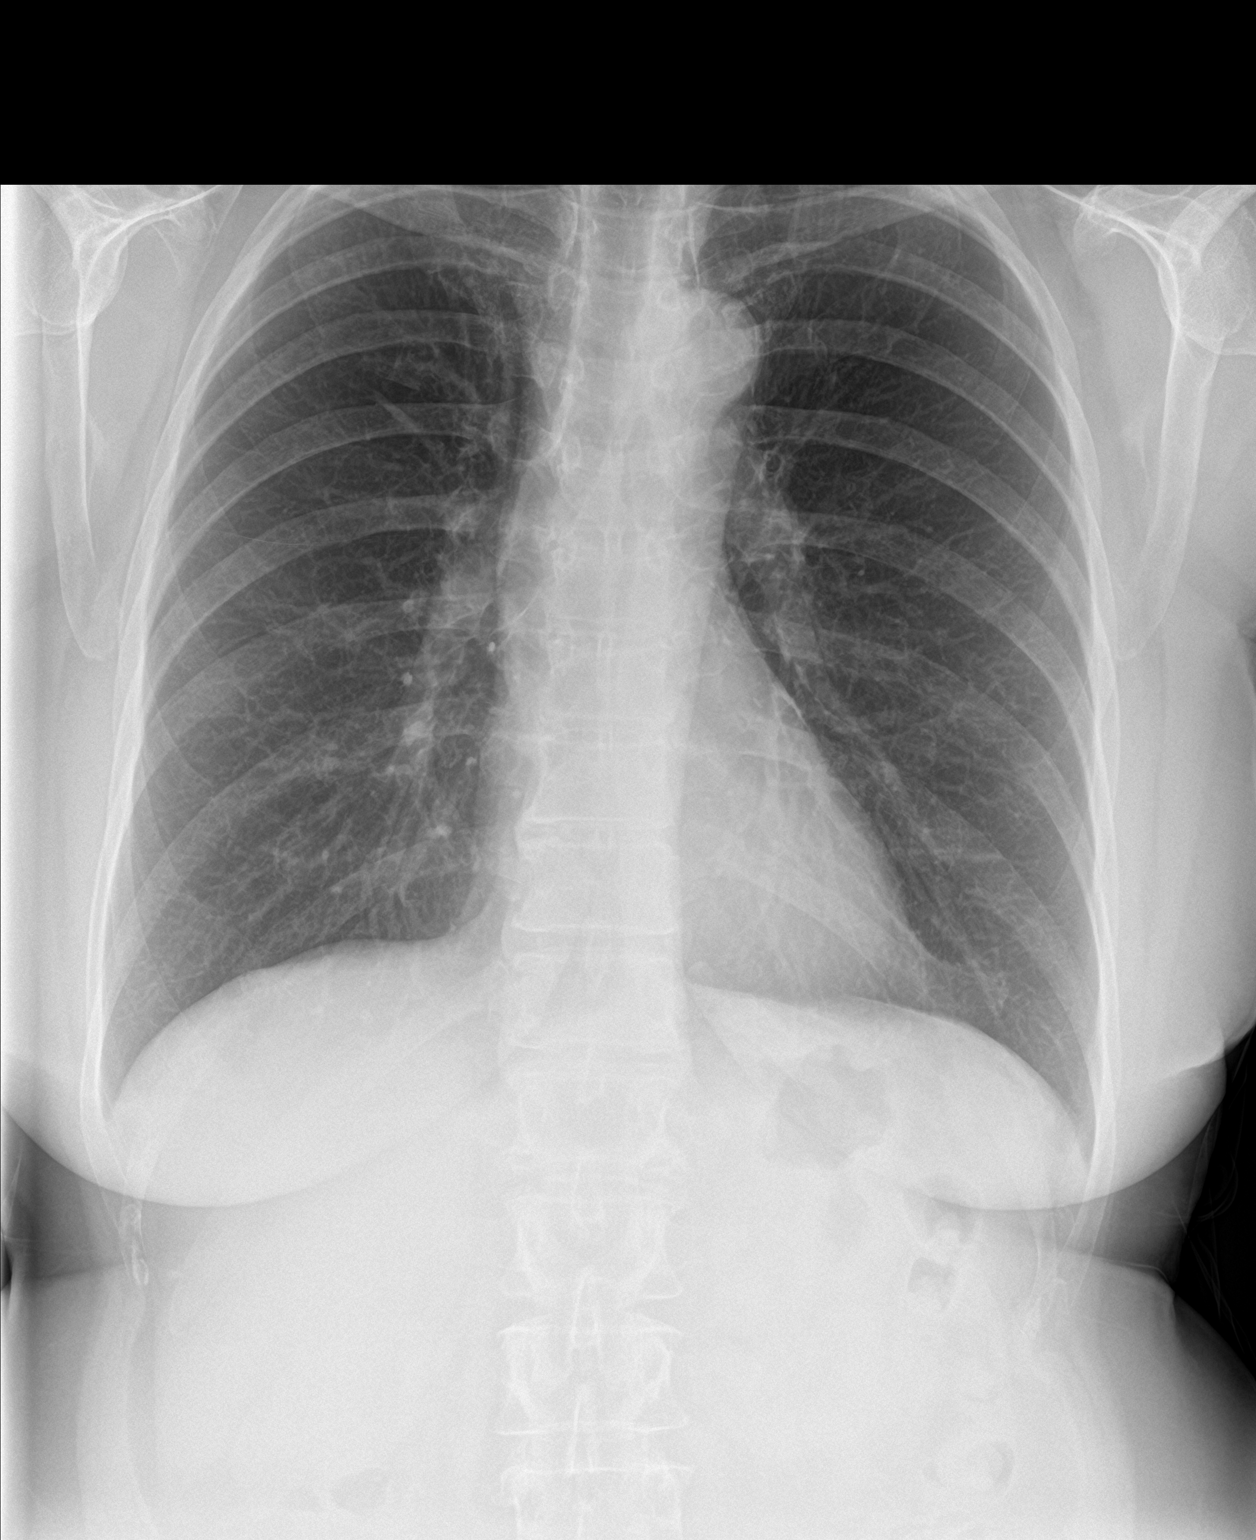

[chest lat]
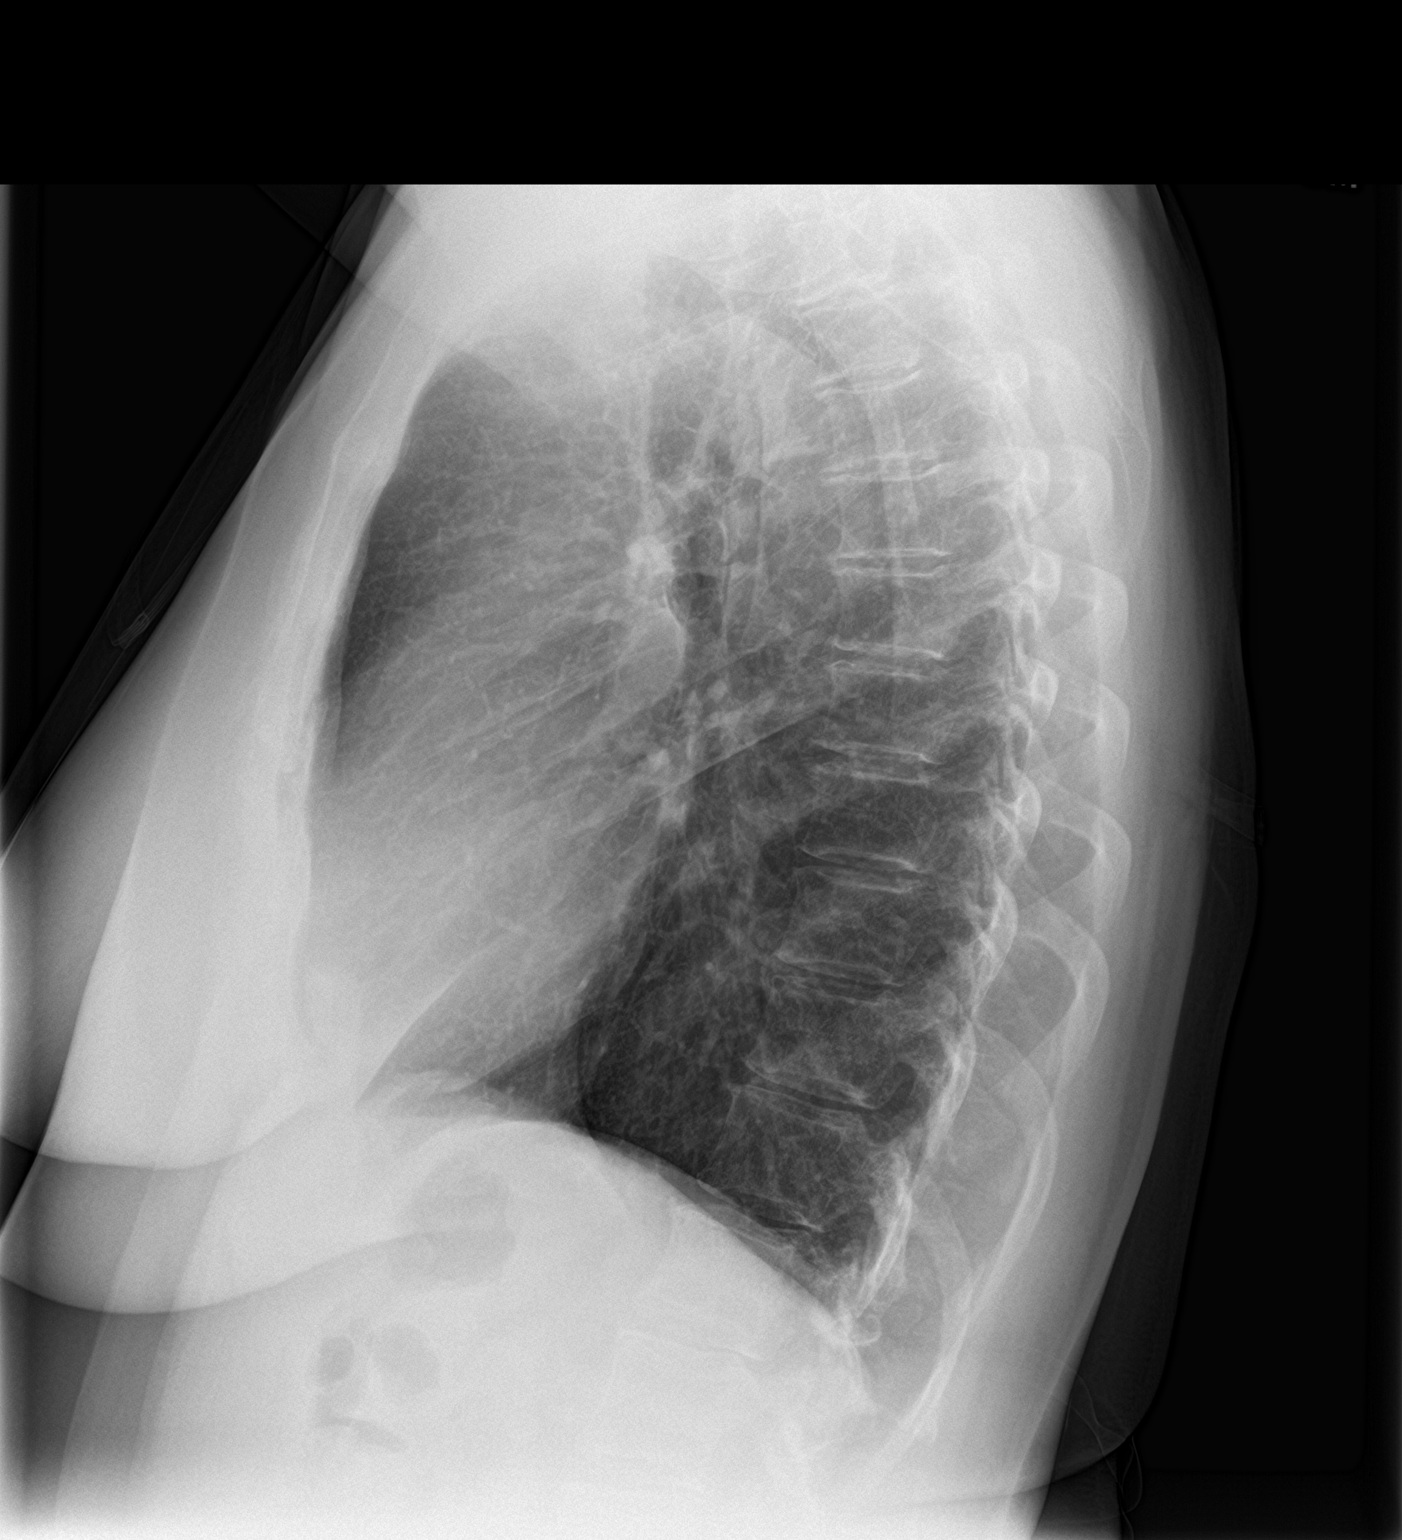

[2 of 2 positions shown; findings below may reference images not displayed]

FINDINGS: The heart size and mediastinal contours are within normal limits.
Both lungs are clear. The visualized skeletal structures are
unremarkable.
IMPRESSION: Normal chest.

## 2017-08-22 ENCOUNTER — Ambulatory Visit (AMBULATORY_SURGERY_CENTER): Payer: Medicare Other | Admitting: Internal Medicine

## 2017-08-22 ENCOUNTER — Encounter: Payer: Self-pay | Admitting: Internal Medicine

## 2017-08-22 VITALS — BP 115/63 | HR 68 | Temp 97.5°F | Resp 13 | Ht 62.0 in | Wt 147.0 lb

## 2017-08-22 DIAGNOSIS — Z800 Family history of malignant neoplasm of digestive organs: Secondary | ICD-10-CM | POA: Diagnosis not present

## 2017-08-22 DIAGNOSIS — Z86010 Personal history of colonic polyps: Secondary | ICD-10-CM | POA: Diagnosis not present

## 2017-08-22 DIAGNOSIS — M797 Fibromyalgia: Secondary | ICD-10-CM | POA: Diagnosis not present

## 2017-08-22 DIAGNOSIS — I10 Essential (primary) hypertension: Secondary | ICD-10-CM | POA: Diagnosis not present

## 2017-08-22 MED ORDER — SODIUM CHLORIDE 0.9 % IV SOLN: 500.0000 mL | Freq: Once | INTRAVENOUS | Status: DC

## 2017-08-22 NOTE — Progress Notes (Signed)
NEUROLOGY FOLLOW UP OFFICE NOTE  Dawn Reyes [id number]  HISTORY OF PRESENT ILLNESS: Dawn Reyes is a 67 year old woman with hypertension, osteoarthritis, depression and fibromyalgia who follows up for restless leg syndrome and burning mouth syndrome.    I  RESTLESS LEG SYNDROME: Update: She is taking gabapentin 900mg  in afternoon and 900mg  at bedtime. Labs from 09/18/16 include ferritin 58 and B12 430. She is doing well.  It is controlled.  History: Onset of symptoms occurred in 2013. The symptoms correlate soon after the dental procedure involving 2 canals of 2 of her right-sided upper teeth. She did see the dentist for this, who said that her symptoms are unlikely related.  She notes a "discomfort "on the left side of her face, involving the maxilla to the temple. It is not really a pain. However, she will occasionally have a brief shooting sensation, about a 5/10.  It is sensitive to the touch.  It appears to be sensitive when she is putting on her makeup. She is never in extreme pain however. She also has a burning sensation of her tongue. This also is a 5/10 in intensity. It is less painful when she is eating or drinking.  Lidocaine mouth rinse and ice provide brief relief  II BURNING MOUTH SYNDROME: Update: She is taking gabapentin 900mg  in afternoon and 900mg  at bedtime She is doing well.  It is controlled.  History: She has had it for several years.  It occurs in the evening and in bed.  When she is in bed or sitting still watching TV, she would experience discomfort in the legs that is relieved by movement.  PAST MEDICAL HISTORY: Past Medical History:  Diagnosis Date  . Anxiety   . Benign hypertension   . 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 Candidiasis of mouth   . 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 Fibromyalgia   . GERD (gastroesophageal reflux disease)   . Hematochezia 02/13/2012  . Left sided abdominal pain 02/13/2012  .  Osteoporosis, unspecified 07/30/2012    MEDICATIONS: Current Outpatient Medications on File Prior to Visit  Medication Sig Dispense Refill  . CALCIUM PO Take by mouth.    . Cholecalciferol (VITAMIN D3) 2000 UNITS TABS Take 1 tablet by mouth daily.    . fluticasone (FLONASE) 50 MCG/ACT nasal spray Place 2 sprays into both nostrils daily. (Patient taking differently: Place 2 sprays into both nostrils daily. ) 16 g 6  . gabapentin (NEURONTIN) 300 MG capsule Take 2 capsules (600 mg total) by mouth 3 (three) times daily. 540 capsule 3  . hydrochlorothiazide (HYDRODIURIL) 25 MG tablet Take 1 tablet (25 mg total) by mouth daily. 90 tablet 1  . Krill Oil 300 MG CAPS Take 1 capsule by mouth daily.    . Multiple Vitamin (MULTIVITAMIN) tablet Take 1 tablet by mouth daily.    . Red Yeast Rice Extract (RED YEAST RICE PO) Take 2,000 mg by mouth daily.    . zoledronic acid (RECLAST) 5 MG/100ML SOLN injection Inject 100 mLs (5 mg total) into the vein once. 100 mL 0   No current facility-administered medications on file prior to visit.     ALLERGIES: Allergies  Allergen Reactions  . Vicodin [Hydrocodone-Acetaminophen] Itching    FAMILY HISTORY: Family History  Problem Relation Age of Onset  . Hypertension Father   . Lung cancer Father   . Liver cancer Father   . Epilepsy Father   . Lung cancer  Mother   . Hypertension Sister   . Osteoporosis Sister   . Hypertension Brother   . Arthritis Brother   . Colon cancer Son 63  . Diabetes Neg Hx     SOCIAL HISTORY: Social History   Socioeconomic History  . Marital status: Widowed    Spouse name: Not on file  . Number of children: 2  . Years of education: Not on file  . Highest education level: Not on file  Occupational History  . Occupation: CSR    Employer: [employer]  Social Needs  . Financial resource strain: Not on file  . Food insecurity:    Worry: Not on file    Inability: Not on file  . Transportation needs:     Medical: Not on file    Non-medical: Not on file  Tobacco Use  . Smoking status: Never Smoker  . Smokeless tobacco: Never Used  Substance and Sexual Activity  . Alcohol use: No    Alcohol/week: 0.0 standard drinks  . Drug use: No  . Sexual activity: Never  Lifestyle  . Physical activity:    Days per week: Not on file    Minutes per session: Not on file  . Stress: Not on file  Relationships  . Social connections:    Talks on phone: Not on file    Gets together: Not on file    Attends religious service: Not on file    Active member of club or organization: Not on file    Attends meetings of clubs or organizations: Not on file    Relationship status: Not on file  . Intimate partner violence:    Fear of current or ex partner: Not on file    Emotionally abused: Not on file    Physically abused: Not on file    Forced sexual activity: Not on file  Other Topics Concern  . Not on file  Social History Narrative   Work or School: [employer]      Home Situation: lives with husband      Spiritual Beliefs: Baptist      Lifestyle: no regular exercise; diet is ok                REVIEW OF SYSTEMS: Constitutional: No fevers, chills, or sweats, no generalized fatigue, change in appetite Eyes: No visual changes, double vision, eye pain Ear, nose and throat: No hearing loss, ear pain, nasal congestion, sore throat Cardiovascular: No chest pain, palpitations Respiratory:  No shortness of breath at rest or with exertion, wheezes GastrointestinaI: No nausea, vomiting, diarrhea, abdominal pain, fecal incontinence Genitourinary:  No dysuria, urinary retention or frequency Musculoskeletal:  No neck pain, back pain Integumentary: No rash, pruritus, skin lesions Neurological: as above Psychiatric: No depression, insomnia, anxiety Endocrine: No palpitations, fatigue, diaphoresis, mood swings, change in appetite, change in weight, increased thirst Hematologic/Lymphatic:  No  purpura, petechiae. Allergic/Immunologic: no itchy/runny eyes, nasal congestion, recent allergic reactions, rashes  PHYSICAL EXAM: Blood pressure 124/68, pulse 73, height 5\' 2"  (1.575 m), weight 146 lb (66.2 kg), SpO2 93 %. General: No acute distress.  Patient appears well-groomed.  normal body habitus. Head:  Normocephalic/atraumatic Eyes:  Fundi examined but not visualized Neck: supple, no paraspinal tenderness, full range of motion Heart:  Regular rate and rhythm Lungs:  Clear to auscultation bilaterally Back: No paraspinal tenderness Neurological Exam:alert and oriented to person, place, and time. Attention span and concentration intact, recent and remote memory intact, fund of knowledge  intact.  Speech fluent and not dysarthric, language intact.  CN II-XII intact. Bulk and tone normal, muscle strength 5/5 throughout.  Sensation to light touch  intact.  Deep tendon reflexes 2+ throughout.  Finger to nose testing intact.  Gait normal, Romberg negative.  IMPRESSION: 1. Restless Leg Syndrome 2.  Burning Mouth Syndrome  PLAN: 1.  Continue gabapentin 900mg  in afternoon and 900mg  at bedtime 2.  Follow up in one year or as needed.  15 minutes spent face to face with patient, over 50% spent discussing management.  Metta Clines, DO  CC: Colin Benton, DO

## 2017-08-22 NOTE — Patient Instructions (Signed)
HANDOUT GIVEN FOR DIVERTICULOSIS  YOU HAD AN ENDOSCOPIC PROCEDURE TODAY AT [facility]:   Refer to the procedure report that was given to you for any specific questions about what was found during the examination.  If the procedure report does not answer your questions, please call your gastroenterologist to clarify.  If you requested that your care partner not be given the details of your procedure findings, then the procedure report has been included in a sealed envelope for you to review at your convenience later.  YOU SHOULD EXPECT: Some feelings of bloating in the abdomen. Passage of more gas than usual.  Walking can help get rid of the air that was put into your GI tract during the procedure and reduce the bloating. If you had a lower endoscopy (such as a colonoscopy or flexible sigmoidoscopy) you may notice spotting of blood in your stool or on the toilet paper. If you underwent a bowel prep for your procedure, you may not have a normal bowel movement for a few days.  Please Note:  You might notice some irritation and congestion in your nose or some drainage.  This is from the oxygen used during your procedure.  There is no need for concern and it should clear up in a day or so.  SYMPTOMS TO REPORT IMMEDIATELY:   Following lower endoscopy (colonoscopy or flexible sigmoidoscopy):  Excessive amounts of blood in the stool  Significant tenderness or worsening of abdominal pains  Swelling of the abdomen that is new, acute  Fever of 100F or higher  For urgent or emergent issues, a gastroenterologist can be reached at any hour by calling [phone].   DIET:  We do recommend a small meal at first, but then you may proceed to your regular diet.  Drink plenty of fluids but you should avoid alcoholic beverages for 24 hours.  ACTIVITY:  You should plan to take it easy for the rest of today and you should NOT DRIVE or use heavy machinery until tomorrow (because of the sedation  medicines used during the test).    FOLLOW UP: Our staff will call the number listed on your records the next business day following your procedure to check on you and address any questions or concerns that you may have regarding the information given to you following your procedure. If we do not reach you, we will leave a message.  However, if you are feeling well and you are not experiencing any problems, there is no need to return our call.  We will assume that you have returned to your regular daily activities without incident.  If any biopsies were taken you will be contacted by phone or by letter within the next 1-3 weeks.  Please call us at [phone] if you have not heard about the biopsies in 3 weeks.    SIGNATURES/CONFIDENTIALITY: You and/or your care partner have signed paperwork which will be entered into your electronic medical record.  These signatures attest to the fact that that the information above on your After Visit Summary has been reviewed and is understood.  Full responsibility of the confidentiality of this discharge information lies with you and/or your care-partner.

## 2017-08-22 NOTE — Op Note (Signed)
[facility] Patient Name: Dawn Reyes Procedure Date: 08/22/2017 2:14 PM MRN: [id number] Endoscopist: Docia Chuck. Henrene Pastor , MD Age: 67 Referring MD:  Date of Birth: 27-Jan-1950 Gender: Female Account #: [id number] Procedure:                Colonoscopy Indications:              High risk colon cancer surveillance: Personal                            history of non-advanced adenoma. Last examination                            January 2014. Also, son with colon cancer at age 37 Medicines:                Monitored Anesthesia Care Procedure:                Pre-Anesthesia Assessment:                           - Prior to the procedure, a History and Physical                            was performed, and patient medications and                            allergies were reviewed. The patient's tolerance of                            previous anesthesia was also reviewed. The risks                            and benefits of the procedure and the sedation                            options and risks were discussed with the patient.                            All questions were answered, and informed consent                            was obtained. Prior Anticoagulants: The patient has                            taken no previous anticoagulant or antiplatelet                            agents. ASA Grade Assessment: II - A patient with                            mild systemic disease. After reviewing the risks                            and benefits, the patient was deemed in  satisfactory condition to undergo the procedure.                           After obtaining informed consent, the colonoscope                            was passed under direct vision. Throughout the                            procedure, the patient's blood pressure, pulse, and                            oxygen saturations were monitored continuously. The                            Colonoscope was  introduced through the anus and                            advanced to the the cecum, identified by                            appendiceal orifice and ileocecal valve. The                            ileocecal valve, appendiceal orifice, and rectum                            were photographed. The quality of the bowel                            preparation was excellent. The colonoscopy was                            performed without difficulty. The patient tolerated                            the procedure well. The bowel preparation used was                            SUPREP. Scope In: 2:22:28 PM Scope Out: 2:39:44 PM Scope Withdrawal Time: 0 hours 8 minutes 26 seconds  Total Procedure Duration: 0 hours 17 minutes 16 seconds  Findings:                 Multiple medium-mouthed diverticula were found in                            the sigmoid colon, with sigmoid stenosis.                           The exam was otherwise without abnormality on                            direct and retroflexion views. Complications:            No immediate complications.  Estimated blood loss:                            None. Estimated Blood Loss:     Estimated blood loss: none. Impression:               - Diverticulosis in the sigmoid colon, with sigmoid                            stenosis.                           - The examination was otherwise normal on direct                            and retroflexion views.                           - No specimens collected. Recommendation:           - Repeat colonoscopy in 5 years for surveillance                            (PEDIATRIC COLONOSCOPE).                           - Patient has a contact number available for                            emergencies. The signs and symptoms of potential                            delayed complications were discussed with the                            patient. Return to normal activities tomorrow.                             Written discharge instructions were provided to the                            patient.                           - Resume previous diet.                           - Continue present medications. Docia Chuck. Henrene Pastor, MD 08/22/2017 2:47:39 PM This report has been signed electronically.

## 2017-08-22 NOTE — Progress Notes (Signed)
Report to PACU, RN, vss, BBS= Clear.  

## 2017-08-23 ENCOUNTER — Encounter: Payer: Self-pay | Admitting: Neurology

## 2017-08-23 ENCOUNTER — Telehealth: Payer: Self-pay

## 2017-08-23 ENCOUNTER — Ambulatory Visit (INDEPENDENT_AMBULATORY_CARE_PROVIDER_SITE_OTHER): Payer: Medicare Other | Admitting: Neurology

## 2017-08-23 ENCOUNTER — Telehealth: Payer: Self-pay | Admitting: *Deleted

## 2017-08-23 VITALS — BP 124/68 | HR 73 | Ht 62.0 in | Wt 146.0 lb

## 2017-08-23 DIAGNOSIS — G2581 Restless legs syndrome: Secondary | ICD-10-CM | POA: Diagnosis not present

## 2017-08-23 DIAGNOSIS — K146 Glossodynia: Secondary | ICD-10-CM | POA: Diagnosis not present

## 2017-08-23 NOTE — Telephone Encounter (Signed)
  Follow up Call-  Call back number 08/22/2017  Post procedure Call Back phone  # [phone]  Permission to leave phone message Yes  Some recent data might be hidden     Patient questions:  Message left to call if necessary.

## 2017-08-23 NOTE — Patient Instructions (Signed)
Continue gabapentin 900mg  in afternoon and 900mg  at bedtime Follow up in one year

## 2017-08-23 NOTE — Telephone Encounter (Signed)
  Follow up Call-  Call back number 08/22/2017  Post procedure Call Back phone  # [phone]  Permission to leave phone message Yes  Some recent data might be hidden     Patient questions:  Do you have a fever, pain , or abdominal swelling? No. Pain Score  0 *  Have you tolerated food without any problems? Yes.    Have you been able to return to your normal activities? Yes.    Do you have any questions about your discharge instructions: Diet   No. Medications  No. Follow up visit  No.  Do you have questions or concerns about your Care? No.  Actions: * If pain score is 4 or above: No action needed, pain <4.

## 2017-09-11 ENCOUNTER — Other Ambulatory Visit: Payer: Self-pay | Admitting: *Deleted

## 2017-09-11 ENCOUNTER — Telehealth: Payer: Self-pay | Admitting: Neurology

## 2017-09-11 MED ORDER — GABAPENTIN 300 MG PO CAPS: 900.0000 mg | ORAL_CAPSULE | Freq: Two times a day (BID) | ORAL | 3 refills | Status: DC

## 2017-09-11 NOTE — Telephone Encounter (Signed)
RX sent to pharmacy  

## 2017-09-11 NOTE — Telephone Encounter (Signed)
Patient states that we need to send the refill for the neurontin into the [facility] in [place] seh is out of medication

## 2018-02-25 DIAGNOSIS — D225 Melanocytic nevi of trunk: Secondary | ICD-10-CM | POA: Diagnosis not present

## 2018-02-25 DIAGNOSIS — T1490XD Injury, unspecified, subsequent encounter: Secondary | ICD-10-CM | POA: Diagnosis not present

## 2018-02-25 DIAGNOSIS — Z86018 Personal history of other benign neoplasm: Secondary | ICD-10-CM | POA: Diagnosis not present

## 2018-02-25 DIAGNOSIS — L814 Other melanin hyperpigmentation: Secondary | ICD-10-CM | POA: Diagnosis not present

## 2018-02-25 DIAGNOSIS — L821 Other seborrheic keratosis: Secondary | ICD-10-CM | POA: Diagnosis not present

## 2018-02-25 DIAGNOSIS — Z23 Encounter for immunization: Secondary | ICD-10-CM | POA: Diagnosis not present

## 2018-02-26 ENCOUNTER — Other Ambulatory Visit: Payer: Self-pay | Admitting: Family Medicine

## 2018-02-26 DIAGNOSIS — Z1231 Encounter for screening mammogram for malignant neoplasm of breast: Secondary | ICD-10-CM

## 2018-03-25 ENCOUNTER — Ambulatory Visit
Admission: RE | Admit: 2018-03-25 | Discharge: 2018-03-25 | Disposition: A | Discharge status: Home / Self Care | Payer: Medicare Other | Source: Ambulatory Visit | Attending: Family Medicine | Admitting: Family Medicine

## 2018-03-25 ENCOUNTER — Other Ambulatory Visit: Payer: Self-pay

## 2018-03-25 DIAGNOSIS — Z1231 Encounter for screening mammogram for malignant neoplasm of breast: Secondary | ICD-10-CM | POA: Diagnosis not present

## 2018-03-27 ENCOUNTER — Other Ambulatory Visit: Payer: Self-pay | Admitting: Family Medicine

## 2018-03-27 DIAGNOSIS — R928 Other abnormal and inconclusive findings on diagnostic imaging of breast: Secondary | ICD-10-CM

## 2018-03-29 ENCOUNTER — Ambulatory Visit
Admission: RE | Admit: 2018-03-29 | Discharge: 2018-03-29 | Disposition: A | Discharge status: Home / Self Care | Payer: Medicare Other | Source: Ambulatory Visit | Attending: Family Medicine | Admitting: Family Medicine

## 2018-03-29 ENCOUNTER — Other Ambulatory Visit: Payer: Self-pay

## 2018-03-29 ENCOUNTER — Ambulatory Visit: Payer: Medicare Other

## 2018-03-29 DIAGNOSIS — R928 Other abnormal and inconclusive findings on diagnostic imaging of breast: Secondary | ICD-10-CM

## 2018-04-22 ENCOUNTER — Other Ambulatory Visit (INDEPENDENT_AMBULATORY_CARE_PROVIDER_SITE_OTHER): Payer: Medicare Other

## 2018-04-22 ENCOUNTER — Other Ambulatory Visit: Payer: Self-pay

## 2018-04-22 DIAGNOSIS — M810 Age-related osteoporosis without current pathological fracture: Secondary | ICD-10-CM | POA: Diagnosis not present

## 2018-04-22 LAB — BASIC METABOLIC PANEL
BUN: 13 mg/dL (ref 6–23)
CO2: 33 mEq/L — ABNORMAL HIGH (ref 19–32)
Calcium: 9.6 mg/dL (ref 8.4–10.5)
Chloride: 99 mEq/L (ref 96–112)
Creatinine, Ser: 0.8 mg/dL (ref 0.40–1.20)
GFR: 71.21 mL/min (ref >60.00)
Glucose, Bld: 89 mg/dL (ref 70–99)
Potassium: 4.6 mEq/L (ref 3.5–5.1)
Sodium: 141 mEq/L (ref 135–145)

## 2018-04-22 LAB — VITAMIN D 25 HYDROXY (VIT D DEFICIENCY, FRACTURES): VITD: 52.42 ng/mL (ref 30.00–100.00)

## 2018-04-25 ENCOUNTER — Encounter: Payer: Self-pay | Admitting: Endocrinology

## 2018-04-25 ENCOUNTER — Ambulatory Visit (INDEPENDENT_AMBULATORY_CARE_PROVIDER_SITE_OTHER): Payer: Medicare Other | Admitting: Endocrinology

## 2018-04-25 ENCOUNTER — Other Ambulatory Visit: Payer: Self-pay

## 2018-04-25 DIAGNOSIS — E559 Vitamin D deficiency, unspecified: Secondary | ICD-10-CM | POA: Diagnosis not present

## 2018-04-25 DIAGNOSIS — M810 Age-related osteoporosis without current pathological fracture: Secondary | ICD-10-CM

## 2018-04-25 NOTE — Progress Notes (Signed)
Patient ID: Dawn Reyes, female   DOB: 02-Aug-1950, 68 y.o.   MRN: [id number]   Today's office visit was provided via telemedicine using video technique Explained to the patient and the the limitations of evaluation and management by telemedicine and the availability of in person appointments.  The patient understood the limitations and agreed to proceed. Patient also understood that the telehealth visit is billable. . Location of the patient: Home . Location of the provider: Office Only the patient and myself were participating in the encounter    History of Present Illness:  OSTEOPOROSIS:   Background history includes menopause at the usual age of about 58+.   She was told by her gynecologist in 2006 that she had osteopenia on screening and bone density.  She  had a T score of -2.0 at the right right neck of femur but her spine was normal At some point the patient was given a trial of Fosamax which she took for about 2 years but stopped it because of difficulty with lower chest and upper abdominal discomfort when she would take this She also tried this again in 2014 but could not tolerate it for longer than 6 months  Recent history: In 2011 she was found to have a T score of -2.5 at the hip and  subsequently this was -2.7 at the left hip in 2014 She was given Atelvia in 2014 and she took this for 3-4 months and did not follow-up for a couple of years after that  She thinks she has lost only about a half an inch in height since her youth Has not had her height checked recently Currently not complaining of any back pain or any bone pain, no recent fractures  She was given Reclast on 05/19/15, 05/09/16 and on 05/15/2017  She had no side effects with her infusions  She also has been taking the 2000 unit doses of vitamin D for the last few years She takes calcium twice daily  She is now here for regular annual follow-up  Her bone density in 2018 showed the following  changes:  04/25/2016 Lumbar spine (L1-L4) Femoral neck (FN) 33% distal radius  T-score -0.6 RFN:-2.4 LFN:-2.4 n/a  Change in BMD from previous DXA test (%) Up 5.6% Up 3.5% n/a    Vitamin D levels:  Lab Results  Component Value Date   VD25OH 52.42 04/22/2018   VD25OH 42.61 04/20/2017   VD25OH 36.31 04/19/2015     Past Medical History:  Diagnosis Date  . Anxiety   . Benign hypertension   . 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 Candidiasis of mouth   . 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 Fibromyalgia   . GERD (gastroesophageal reflux disease)   . Hematochezia 02/13/2012  . Left sided abdominal pain 02/13/2012  . Osteoporosis, unspecified 07/30/2012    Past Surgical History:  Procedure Laterality Date  . ABDOMINAL HYSTERECTOMY    . BREAST LUMPECTOMY Right   . FOOT SURGERY Bilateral   . TONSILLECTOMY      Family History  Problem Relation Age of Onset  . Hypertension Father   . Lung cancer Father   . Liver cancer Father   . Epilepsy Father   . Lung cancer Mother   . Hypertension Sister   . Osteoporosis Sister   . Hypertension Brother   . Arthritis Brother   . Colon cancer Son 90  . Diabetes Neg Hx  Social History:  reports that she has never smoked. She has never used smokeless tobacco. She reports that she does not drink alcohol or use drugs.  Allergies:  Allergies  Allergen Reactions  . Vicodin [Hydrocodone-Acetaminophen] Itching    Allergies as of 04/25/2018      Reactions   Vicodin [hydrocodone-acetaminophen] Itching      Medication List       Accurate as of April 25, 2018  9:46 AM. Always use your most recent med list.        CALCIUM PO Take by mouth.   fluticasone 50 MCG/ACT nasal spray Commonly known as:  FLONASE Place 2 sprays into both nostrils daily.   gabapentin 300 MG capsule Commonly known as:  NEURONTIN Take 3 capsules (900 mg total) by mouth 2 (two) times daily.   hydrochlorothiazide 25 MG  tablet Commonly known as:  HYDRODIURIL Take 1 tablet (25 mg total) by mouth daily.   Krill Oil 300 MG Caps Take 1 capsule by mouth daily.   multivitamin tablet Take 1 tablet by mouth daily.   RED YEAST RICE PO Take 2,000 mg by mouth daily.   Vitamin D3 50 MCG (2000 UT) Tabs Take 1 tablet by mouth daily.   zoledronic acid 5 MG/100ML Soln injection Commonly known as:  Reclast Inject 100 mLs (5 mg total) into the vein once.        REVIEW OF SYSTEMS:         She is on HCTZ for hypertension  Has a normal calcium levels      EXAM:  There were no vitals taken for this visit.   ASSESSMENT:   History of osteoporosis, long-standing and clinically asymptomatic Has had insignificant height loss previously  She has mild osteoporosis at the left neck of the femur with lowest T score -2.7 in 2014 which improved to -2.4 in 2018 She has tolerated Reclast annually, last injection in May 2019  Currently has had no new symptoms  Her vitamin D level is therapeutic with 2000 units of vitamin D3 Calcium level normal    PLAN:   Needs to have repeat dose of Reclast whenever she is able to come in in the next few months Would recommend at least another 2 injections before giving her a drug holiday and this was discussed  Elayne Snare 04/25/2018, 9:46 AM

## 2018-05-02 ENCOUNTER — Other Ambulatory Visit: Payer: Self-pay | Admitting: Family Medicine

## 2018-05-02 DIAGNOSIS — M542 Cervicalgia: Secondary | ICD-10-CM

## 2018-05-02 MED ORDER — HYDROCHLOROTHIAZIDE 25 MG PO TABS: 25.0000 mg | ORAL_TABLET | Freq: Every day | ORAL | 0 refills | Status: DC

## 2018-05-02 NOTE — Telephone Encounter (Signed)
Refill sent.

## 2018-05-02 NOTE — Telephone Encounter (Signed)
Patient called to cancel CPE appointment with Dr. Maudie Mercury and schedule TOC with Ethlyn Gallery in July. She did not want a virtual appointment. She also needs a refill on her hydrochlorothiazide (HYDRODIURIL) 25 MG tablet  Sent to:  [place], [place] - [place] [address] [phone] (Phone) [phone] (Fax)   Significant History/Details

## 2018-05-02 NOTE — Addendum Note (Signed)
Addended by: Westley Hummer B on: 05/02/2018 02:03 PM   Modules accepted: Orders

## 2018-05-06 ENCOUNTER — Encounter: Payer: Medicare Other | Admitting: Family Medicine

## 2018-06-26 ENCOUNTER — Telehealth: Payer: Self-pay | Admitting: Nutrition

## 2018-07-01 ENCOUNTER — Ambulatory Visit (INDEPENDENT_AMBULATORY_CARE_PROVIDER_SITE_OTHER): Payer: Medicare Other | Admitting: Endocrinology

## 2018-07-01 DIAGNOSIS — M818 Other osteoporosis without current pathological fracture: Secondary | ICD-10-CM

## 2018-07-03 NOTE — Patient Instructions (Signed)
Drink 4-5 glasses of water today Continue to take your Calcium and Vitamin D as directed by Dr. Dwyane Dee

## 2018-07-03 NOTE — Progress Notes (Signed)
Per Dr. Ronnie Derby note on 04/25/18, and, after  signing the consent, an IV was started in her left arm using a 20g needle.  Normal saline was infused for 2 min.  No infiltration was noted. 5mg . of reclast was infused beginning at 8:45 AM and ended at 9:15 AM. Patient reported no discomfort during this procedure.  Normal saline was then infused for 3 minutes and  the IV was discontinued.  The site showed no signes of redness or swelling.  The patient was encouraged to drink 4-5 glasses of water today, and to continue to take her calcium and vitamin D as directed by Dr. Dwyane Dee.  Leonia Reader, RN

## 2018-07-10 ENCOUNTER — Other Ambulatory Visit: Payer: Self-pay

## 2018-07-17 ENCOUNTER — Other Ambulatory Visit: Payer: Self-pay

## 2018-07-17 ENCOUNTER — Ambulatory Visit (INDEPENDENT_AMBULATORY_CARE_PROVIDER_SITE_OTHER): Payer: Medicare Other | Admitting: Family Medicine

## 2018-07-17 ENCOUNTER — Encounter: Payer: Self-pay | Admitting: Family Medicine

## 2018-07-17 DIAGNOSIS — M818 Other osteoporosis without current pathological fracture: Secondary | ICD-10-CM

## 2018-07-17 DIAGNOSIS — I10 Essential (primary) hypertension: Secondary | ICD-10-CM | POA: Diagnosis not present

## 2018-07-17 DIAGNOSIS — M79641 Pain in right hand: Secondary | ICD-10-CM | POA: Diagnosis not present

## 2018-07-17 NOTE — Progress Notes (Signed)
Virtual Visit via Video Note  I connected with Dawn Reyes  on 07/17/18 at  1:00 PM EDT by a video enabled telemedicine application and verified that I am speaking with the correct person using two identifiers.  Location patient: home Location provider:work office Persons participating in the virtual visit: patient, provider  I discussed the limitations of evaluation and management by telemedicine and the availability of in person appointments. The patient expressed understanding and agreed to proceed.   Dawn Reyes DOB: 28-May-1950 Encounter date: 07/17/2018  This is a 68 y.o. female who presents to establish care. Chief Complaint  Patient presents with  . 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 No initial injury. Just seemed to start and has been getting worse. No swelling or redness in joints around that area. Does have knot on 4th finger knuckle. Only a little aching when she is not using hand. Knot has gotten large in 6 months. She does a lot of crafting/art work.   Of note, she states that she never heard from ENT regarding referral that was addressed at her previous visit, but her sore throat and throat symptoms have completely resolved.  She does not feel that this referral is needed any longer.  HTN:on hctz 25 - checks daily 135/78 today. Never higher; sometimes lower.  Depression/Anxiety: not on medications; states that mood is fine. No counseling or current follow up. Fibromyalgia: states that her body hurts, but this is something that she has gotten used to.  GERD: no symptoms of this; not on medication for this.  Allergies: flonase just when needed.  Osteoporosis:reclast, vitamin D, calcium.  On the gabapentin for restless legs, burning tongue. Has had these for years.     Past Medical History:  Diagnosis Date  . Anxiety   . Benign hypertension   . Burning  tongue syndrome 02/06/2013  . Candidiasis of mouth   . 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 Fibromyalgia   . GERD (gastroesophageal reflux disease)   . Hematochezia 02/13/2012  . Left sided abdominal pain 02/13/2012  . Osteoporosis, unspecified 07/30/2012   Past Surgical History:  Procedure Laterality Date  . ABDOMINAL HYSTERECTOMY     no concern for cancer; menorrhagia  . BREAST LUMPECTOMY Right   . FOOT SURGERY Bilateral    mortons neuroma removal  . TONSILLECTOMY     Allergies  Allergen Reactions  . Vicodin [Hydrocodone-Acetaminophen] Itching   Current Meds  Medication Sig  . CALCIUM PO Take by mouth.  . Cholecalciferol (VITAMIN D3) 2000 UNITS TABS Take 1 tablet by mouth daily.  . fluticasone (FLONASE) 50 MCG/ACT nasal spray Place 2 sprays into both nostrils daily. (Patient taking differently: Place 2 sprays into both nostrils daily. )  . gabapentin (NEURONTIN) 300 MG capsule Take 3 capsules (900 mg total) by mouth 2 (two) times daily.  . hydrochlorothiazide (HYDRODIURIL) 25 MG tablet Take 1 tablet (25 mg total) by mouth daily.  Javier Docker Oil 300 MG CAPS Take 1 capsule by mouth daily.  . Multiple Vitamin (MULTIVITAMIN) tablet Take 1 tablet by mouth daily.  . Red Yeast Rice Extract (RED YEAST RICE PO) Take 2,000 mg by mouth daily.  . zoledronic acid (RECLAST) 5 MG/100ML SOLN injection Inject 100 mLs (5 mg total) into the vein once.   Social History   Tobacco Use  . Smoking status:  Never Smoker  . Smokeless tobacco: Never Used  Substance Use Topics  . Alcohol use: No    Alcohol/week: 0.0 standard drinks   Family History  Problem Relation Age of Onset  . Hypertension Father   . Lung cancer Father   . Liver cancer Father   . Epilepsy Father   . Lung cancer Mother   . Hypertension Sister   . Osteoporosis Sister   . Hypertension Brother   . Arthritis Brother   . Lung cancer Brother        small cell  . Colon cancer Son 60  . Diabetes  Neg Hx      Review of Systems  Constitutional: Negative for chills, fatigue and fever.  Respiratory: Negative for cough, chest tightness, shortness of breath and wheezing.   Cardiovascular: Negative for chest pain, palpitations and leg swelling.  Musculoskeletal: Positive for arthralgias.    Objective:  There were no vitals taken for this visit.      BP Readings from Last 3 Encounters:  08/23/17 124/68  08/22/17 115/63  06/20/17 110/74   Wt Readings from Last 3 Encounters:  08/23/17 146 lb (66.2 kg)  08/22/17 147 lb (66.7 kg)  06/20/17 147 lb (66.7 kg)    EXAM: Video feed only lasted for a couple of minutes before we lost connection.  GENERAL: alert, oriented, appears well and in no acute distress  HEENT: atraumatic, conjunctiva clear, no obvious abnormalities on inspection of external nose and ears  NECK: normal movements of the head and neck  LUNGS: on inspection no signs of respiratory distress, breathing rate appears normal, no obvious gross SOB, gasping or wheezing  CV: no obvious cyanosis  MS: moves all visible extremities without noticeable abnormality  PSYCH/NEURO: pleasant and cooperative, no obvious depression or anxiety, speech and thought processing grossly intact  SKIN: No noted facial skin abnormalities.  Assessment/Plan  1. Essential hypertension, benign Stable.  Continue current medication.  2. Other osteoporosis without current pathological fracture On Reclast.  Following with Dr Dwyane Dee.  3. Hand pain, right Because she has worsening hand pain along with increasing nodule over her knuckle, will refer to hand specialist.  She does a lot of intricate craft work and uses her hands very regularly.  I believe that specialty referral will help expedite evaluation and treatment. - Ambulatory referral to Hand Surgery    Return in about 5 months (around 12/17/2018) for physical exam, Chronic condition visit.   I discussed the assessment and treatment  plan with the patient. The patient was provided an opportunity to ask questions and all were answered. The patient agreed with the plan and demonstrated an understanding of the instructions.   The patient was advised to call back or seek an in-person evaluation if the symptoms worsen or if the condition fails to improve as anticipated.  I provided 30 minutes of non-face-to-face time during this encounter.   Micheline Rough, MD

## 2018-07-18 ENCOUNTER — Telehealth: Payer: Self-pay | Admitting: *Deleted

## 2018-07-18 NOTE — Telephone Encounter (Signed)
-----   Message from Caren Macadam, MD sent at 07/17/2018  1:39 PM EDT ----- Please schedule visit for December - [facility] (could also schedule AWV remote with [facility] if patient desires and could do this between now and end of year! Let her know this way she can review things with her but then have me for in office physical exam)

## 2018-07-18 NOTE — Telephone Encounter (Signed)
I called the pt and scheduled one appt in December per pts preference.

## 2018-07-23 NOTE — Telephone Encounter (Signed)
Opened in error

## 2018-07-26 DIAGNOSIS — M79644 Pain in right finger(s): Secondary | ICD-10-CM | POA: Diagnosis not present

## 2018-07-26 DIAGNOSIS — M19031 Primary osteoarthritis, right wrist: Secondary | ICD-10-CM | POA: Diagnosis not present

## 2018-09-06 DIAGNOSIS — M19031 Primary osteoarthritis, right wrist: Secondary | ICD-10-CM | POA: Diagnosis not present

## 2018-12-19 ENCOUNTER — Other Ambulatory Visit: Payer: Self-pay

## 2018-12-20 ENCOUNTER — Ambulatory Visit (INDEPENDENT_AMBULATORY_CARE_PROVIDER_SITE_OTHER): Payer: Medicare Other | Admitting: Family Medicine

## 2018-12-20 ENCOUNTER — Encounter: Payer: Self-pay | Admitting: Family Medicine

## 2018-12-20 VITALS — BP 120/70 | HR 71 | Temp 97.9°F | Ht 62.0 in | Wt 142.1 lb

## 2018-12-20 DIAGNOSIS — M7632 Iliotibial band syndrome, left leg: Secondary | ICD-10-CM

## 2018-12-20 DIAGNOSIS — I10 Essential (primary) hypertension: Secondary | ICD-10-CM | POA: Diagnosis not present

## 2018-12-20 DIAGNOSIS — E785 Hyperlipidemia, unspecified: Secondary | ICD-10-CM | POA: Diagnosis not present

## 2018-12-20 DIAGNOSIS — R739 Hyperglycemia, unspecified: Secondary | ICD-10-CM

## 2018-12-20 DIAGNOSIS — M818 Other osteoporosis without current pathological fracture: Secondary | ICD-10-CM | POA: Diagnosis not present

## 2018-12-20 DIAGNOSIS — R040 Epistaxis: Secondary | ICD-10-CM

## 2018-12-20 DIAGNOSIS — R232 Flushing: Secondary | ICD-10-CM

## 2018-12-20 DIAGNOSIS — M7062 Trochanteric bursitis, left hip: Secondary | ICD-10-CM

## 2018-12-20 LAB — TSH: TSH: 2.9 u[IU]/mL (ref 0.35–4.50)

## 2018-12-20 LAB — COMPREHENSIVE METABOLIC PANEL
ALT: 16 U/L (ref 0–35)
AST: 20 U/L (ref 0–37)
Albumin: 4.6 g/dL (ref 3.5–5.2)
Alkaline Phosphatase: 53 U/L (ref 39–117)
BUN: 15 mg/dL (ref 6–23)
CO2: 32 mEq/L (ref 19–32)
Calcium: 9.7 mg/dL (ref 8.4–10.5)
Chloride: 101 mEq/L (ref 96–112)
Creatinine, Ser: 0.74 mg/dL (ref 0.40–1.20)
GFR: 77.76 mL/min (ref >60.00)
Glucose, Bld: 89 mg/dL (ref 70–99)
Potassium: 4.9 mEq/L (ref 3.5–5.1)
Sodium: 142 mEq/L (ref 135–145)
Total Bilirubin: 0.5 mg/dL (ref 0.2–1.2)
Total Protein: 6.9 g/dL (ref 6.0–8.3)

## 2018-12-20 LAB — LIPID PANEL
Cholesterol: 199 mg/dL (ref 0–200)
HDL: 80.9 mg/dL (ref >39.00)
LDL Cholesterol: 99 mg/dL (ref 0–99)
NonHDL: 118.17
Total CHOL/HDL Ratio: 2
Triglycerides: 96 mg/dL (ref 0.0–149.0)
VLDL: 19.2 mg/dL (ref 0.0–40.0)

## 2018-12-20 LAB — CBC WITH DIFFERENTIAL/PLATELET
Basophils Absolute: 0 10*3/uL (ref 0.0–0.1)
Basophils Relative: 0.8 % (ref 0.0–3.0)
Eosinophils Absolute: 0.1 10*3/uL (ref 0.0–0.7)
Eosinophils Relative: 2.4 % (ref 0.0–5.0)
HCT: 41.2 % (ref 36.0–46.0)
Hemoglobin: 13.7 g/dL (ref 12.0–15.0)
Lymphocytes Relative: 35.9 % (ref 12.0–46.0)
Lymphs Abs: 1.8 10*3/uL (ref 0.7–4.0)
MCHC: 33.2 g/dL (ref 30.0–36.0)
MCV: 95.4 fl (ref 78.0–100.0)
Monocytes Absolute: 0.3 10*3/uL (ref 0.1–1.0)
Monocytes Relative: 6 % (ref 3.0–12.0)
Neutro Abs: 2.8 10*3/uL (ref 1.4–7.7)
Neutrophils Relative %: 54.9 % (ref 43.0–77.0)
Platelets: 358 10*3/uL (ref 150.0–400.0)
RBC: 4.32 Mil/uL (ref 3.87–5.11)
RDW: 14.1 % (ref 11.5–15.5)
WBC: 5.1 10*3/uL (ref 4.0–10.5)

## 2018-12-20 LAB — HEMOGLOBIN A1C: Hgb A1c MFr Bld: 5.5 % (ref 4.6–6.5)

## 2018-12-20 MED ORDER — TRIAMCINOLONE ACETONIDE 40 MG/ML IJ SUSP
40.0000 mg | Freq: Once | INTRAMUSCULAR | Status: AC
  Administered 2018-12-20: 40 mg via INTRA_ARTICULAR

## 2018-12-20 MED ORDER — SHINGRIX 50 MCG/0.5ML IM SUSR: 0.5000 mL | Freq: Once | INTRAMUSCULAR | 0 refills | Status: AC

## 2018-12-20 NOTE — Progress Notes (Signed)
Dawn Reyes DOB: 1950-07-01 Encounter date: 12/20/2018  This is a 68 y.o. female who presents with Chief Complaint  Patient presents with  . Follow-up    History of present illness: No real concerns today except for left hip. Gets shots periodically. It has really been bothering her lately. Sometimes hurts all the way up side, but pain radiating down past knee.   HTN:on hctz 25 - when she ran out, she used daughters 12.5mg  dose for awhile. She is getting good numbers (similar to today's readings and no high).   Fibromyalgia: states that her body hurts, but this is something that she has gotten used to. Joints are worse in cold but not fibro.   Osteoporosis:reclast, vitamin D, calcium.   On the gabapentin for restless legs, burning tongue. Has had these for years. doing ok with this.   Mammogram ok from 03/2018. Repeat 1 year. Colonoscopy completed 08/2017; repeat in 5 year (Dr. Henrene Pastor). Patient states she doesn't want repeat.   ?shingrix, flu, pneumonia: is interested in shingles immunization this year. Doesn't want flu and pneumonia vaccines. Feels that immune system is very strong.   Not exercising on regular basis.   Getting hot flashes. During day time. Last minutes. No sweating. No other associated sx; no sob or heart racing.  Getting nosebleeds for 2 years; more frequent and without cause. Right side.   Allergies  Allergen Reactions  . Vicodin [Hydrocodone-Acetaminophen] Itching   Current Meds  Medication Sig  . CALCIUM PO Take by mouth.  . Cholecalciferol (VITAMIN D3) 2000 UNITS TABS Take 1 tablet by mouth daily.  . fluticasone (FLONASE) 50 MCG/ACT nasal spray Place 2 sprays into both nostrils daily. (Patient taking differently: Place 2 sprays into both nostrils daily. )  . gabapentin (NEURONTIN) 300 MG capsule Take 3 capsules (900 mg total) by mouth 2 (two) times daily.  Javier Docker Oil 300 MG CAPS Take 1 capsule by mouth daily.  . Multiple Vitamin (MULTIVITAMIN) tablet  Take 1 tablet by mouth daily.  . Red Yeast Rice Extract (RED YEAST RICE PO) Take 2,000 mg by mouth daily.  . zoledronic acid (RECLAST) 5 MG/100ML SOLN injection Inject 100 mLs (5 mg total) into the vein once.  . [DISCONTINUED] hydrochlorothiazide (HYDRODIURIL) 25 MG tablet Take 1 tablet (25 mg total) by mouth daily.    Review of Systems  Constitutional: Negative for chills, fatigue and fever.  Respiratory: Negative for cough, chest tightness, shortness of breath and wheezing.   Cardiovascular: Negative for chest pain, palpitations and leg swelling.    Objective:  BP 120/70 (BP Location: Left Arm, Patient Position: Sitting, Cuff Size: Normal)   Pulse 71   Temp 97.9 F (36.6 C) (Temporal)   Ht 5\' 2"  (1.575 m)   Wt 142 lb 1.6 oz (64.5 kg)   SpO2 98%   BMI 25.99 kg/m   Weight: 142 lb 1.6 oz (64.5 kg)   BP Readings from Last 3 Encounters:  12/20/18 120/70  08/23/17 124/68  08/22/17 115/63   Wt Readings from Last 3 Encounters:  12/20/18 142 lb 1.6 oz (64.5 kg)  08/23/17 146 lb (66.2 kg)  08/22/17 147 lb (66.7 kg)    Physical Exam Constitutional:      General: She is not in acute distress.    Appearance: She is well-developed.  Cardiovascular:     Rate and Rhythm: Normal rate and regular rhythm.     Heart sounds: Normal heart sounds. No murmur. No friction rub.  Pulmonary:  Effort: Pulmonary effort is normal. No respiratory distress.     Breath sounds: Normal breath sounds. No wheezing or rales.  Musculoskeletal:     Right lower leg: No edema.     Left lower leg: No edema.     Comments: There is greater trochanteric bursa tenderness. There is IT band tenderness. Full ROM Of hip, with tenderness on full lateral rotation anterior hip/lateral inguinal area. Full strength. There is SI tenderness to palpation as well.   Neurological:     Mental Status: She is alert and oriented to person, place, and time.  Psychiatric:        Behavior: Behavior normal.    With patient in  right lateral decubitus position with knees flexed to 90 deg, greater trochanter located by palpation. Area prepped with chloraprep and lidocaine 1% 4cc with kenalog 40mg  1cc injected into bursa. Patient tolerated procedure well.   Assessment/Plan  1. Essential hypertension, benign Controled off of medication. hctz has been stopped.  - CBC with Differential/Platelet; Future - Comprehensive metabolic panel; Future  2. Other osteoporosis without current pathological fracture On reclast, ca/d  3. Hyperglycemia - Hemoglobin A1c; Future  4. Dyslipidemia Red yeast rice, krill oil. - Lipid panel; Future - TSH; Future  5. Trochanteric bursitis of left hip Injection given today; would consider PT if not improving due to muscular tightness on exam. - Large Joint Injection/Arthrocentesis; Future  6. It band syndrome, left Stretches discussed/demonstrated in office. See above.  7. Hot flashes Will start with bloodwork. Follow up pending this.   8. Epistaxis: coming 4x/mo, no trigger. Refer to ent for consideration of cautery. Suggested humidifier and nasal saline gel in meanwhile.   Return if symptoms worsen or fail to improve, for and in 6 mo for CCV.     Micheline Rough, MD

## 2018-12-20 NOTE — Addendum Note (Signed)
Addended by: Suzette Battiest on: 12/20/2018 10:57 AM   Modules accepted: Orders

## 2018-12-20 NOTE — Patient Instructions (Signed)
 Hip Bursitis Rehab Ask your health care provider which exercises are safe for you. Do exercises exactly as told by your health care provider and adjust them as directed. It is normal to feel mild stretching, pulling, tightness, or discomfort as you do these exercises. Stop right away if you feel sudden pain or your pain gets worse. Do not begin these exercises until told by your health care provider. Stretching exercise This exercise warms up your muscles and joints and improves the movement and flexibility of your hip. This exercise also helps to relieve pain and stiffness. Iliotibial band stretch An iliotibial band is a strong band of muscle tissue that runs from the outer side of your hip to the outer side of your thigh and knee. 1. Lie on your side with your left / right leg in the top position. 2. Bend your left / right knee and grab your ankle. Stretch out your bottom arm to help you balance. 3. Slowly bring your knee back so your thigh is behind your body. 4. Slowly lower your knee toward the floor until you feel a gentle stretch on the outside of your left / right thigh. If you do not feel a stretch and your knee will not fall farther, place the heel of your other foot on top of your knee and pull your knee down toward the floor with your foot. 5. Hold this position for __________ seconds. 6. Slowly return to the starting position. Repeat __________ times. Complete this exercise __________ times a day. Strengthening exercises These exercises build strength and endurance in your hip and pelvis. Endurance is the ability to use your muscles for a long time, even after they get tired. Bridge This exercise strengthens the muscles that move your thigh backward (hip extensors). 1. Lie on your back on a firm surface with your knees bent and your feet flat on the floor. 2. Tighten your buttocks muscles and lift your buttocks off the floor until your trunk is level with your thighs. ? Do not  arch your back. ? You should feel the muscles working in your buttocks and the back of your thighs. If you do not feel these muscles, slide your feet 1-2 inches (2.5-5 cm) farther away from your buttocks. ? If this exercise is too easy, try doing it with your arms crossed over your chest. 3. Hold this position for __________ seconds. 4. Slowly lower your hips to the starting position. 5. Let your muscles relax completely after each repetition. Repeat __________ times. Complete this exercise __________ times a day. Squats This exercise strengthens the muscles in front of your thigh and knee (quadriceps). 1. Stand in front of a table, with your feet and knees pointing straight ahead. You may rest your hands on the table for balance but not for support. 2. Slowly bend your knees and lower your hips like you are going to sit in a chair. ? Keep your weight over your heels, not over your toes. ? Keep your lower legs upright so they are parallel with the table legs. ? Do not let your hips go lower than your knees. ? Do not bend lower than told by your health care provider. ? If your hip pain increases, do not bend as low. 3. Hold the squat position for __________ seconds. 4. Slowly push with your legs to return to standing. Do not use your hands to pull yourself to standing. Repeat __________ times. Complete this exercise __________ times a day. Hip hike 1.   Stand sideways on a bottom step. Stand on your left / right leg with your other foot unsupported next to the step. You can hold on to the railing or wall for balance if needed. 2. Keep your knees straight and your torso square. Then lift your left / right hip up toward the ceiling. 3. Hold this position for __________ seconds. 4. Slowly let your left / right hip lower toward the floor, past the starting position. Your foot should get closer to the floor. Do not lean or bend your knees. Repeat __________ times. Complete this exercise __________  times a day. Single leg stand 1. Without shoes, stand near a railing or in a doorway. You may hold on to the railing or door frame as needed for balance. 2. Squeeze your left / right buttock muscles, then lift up your other foot. ? Do not let your left / right hip push out to the side. ? It is helpful to stand in front of a mirror for this exercise so you can watch your hip. 3. Hold this position for __________ seconds. Repeat __________ times. Complete this exercise __________ times a day. This information is not intended to replace advice given to you by your health care provider. Make sure you discuss any questions you have with your health care provider. Document Released: 02/03/2004 Document Revised: 04/22/2018 Document Reviewed: 04/22/2018 Elsevier Patient Education  2020 [employer]  

## 2018-12-20 NOTE — Addendum Note (Signed)
Addended by: Agnes Lawrence on: 12/20/2018 11:05 AM   Modules accepted: Orders

## 2018-12-23 ENCOUNTER — Telehealth: Payer: Self-pay

## 2018-12-23 NOTE — Telephone Encounter (Signed)
Pt given lab results per notes of Dr. Ethlyn Gallery  on 12/23/18. Pt verbalized understanding.

## 2019-01-06 ENCOUNTER — Encounter: Payer: Self-pay | Admitting: Family Medicine

## 2019-01-13 ENCOUNTER — Other Ambulatory Visit: Payer: Self-pay | Admitting: Family Medicine

## 2019-01-13 MED ORDER — HYDROCHLOROTHIAZIDE 25 MG PO TABS: 25.0000 mg | ORAL_TABLET | Freq: Every day | ORAL | 1 refills | Status: DC

## 2019-02-06 ENCOUNTER — Other Ambulatory Visit: Payer: Self-pay

## 2019-02-06 ENCOUNTER — Encounter (INDEPENDENT_AMBULATORY_CARE_PROVIDER_SITE_OTHER): Payer: Self-pay | Admitting: Otolaryngology

## 2019-02-06 ENCOUNTER — Ambulatory Visit (INDEPENDENT_AMBULATORY_CARE_PROVIDER_SITE_OTHER): Payer: Medicare Other | Admitting: Otolaryngology

## 2019-02-06 VITALS — Temp 97.9°F

## 2019-02-06 DIAGNOSIS — R040 Epistaxis: Secondary | ICD-10-CM

## 2019-02-06 NOTE — Progress Notes (Signed)
HPI: Dawn Reyes is a 69 y.o. female who presents is referred by her PCP for evaluation of recurrent epistaxis.  She apparently has had a history of frequent nosebleeds generally from the right side of her nostril but has not had a nosebleed for 4 weeks now.  When it bleeds only bleeds for a few minutes but she can sometimes tell when it is starting to bleed.  She does use Nasacort regularly but does not complain of any trouble breathing through her nose.  She is on no blood thinners.  Past Medical History:  Diagnosis Date  . Anxiety   . Benign hypertension   . 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 Candidiasis of mouth   . 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 Fibromyalgia   . GERD (gastroesophageal reflux disease)   . Hematochezia 02/13/2012  . Left sided abdominal pain 02/13/2012  . Osteoporosis, unspecified 07/30/2012   Past Surgical History:  Procedure Laterality Date  . ABDOMINAL HYSTERECTOMY     no concern for cancer; menorrhagia  . BREAST LUMPECTOMY Right   . FOOT SURGERY Bilateral    mortons neuroma removal  . TONSILLECTOMY     Social History   Socioeconomic History  . Marital status: Widowed    Spouse name: Not on file  . Number of children: 2  . Years of education: Not on file  . Highest education level: Not on file  Occupational History  . Occupation: CSR    Employer: [employer]  Tobacco Use  . Smoking status: Never Smoker  . Smokeless tobacco: Never Used  Substance and Sexual Activity  . Alcohol use: No    Alcohol/week: 0.0 standard drinks  . Drug use: No  . Sexual activity: Never  Other Topics Concern  . Not on file  Social History Narrative   Work or School: [employer]      Home Situation: lives with husband      Spiritual Beliefs: Baptist      Lifestyle: no regular exercise; diet is ok               Social Determinants of Radio broadcast assistant Strain:   . Difficulty of Paying  Living Expenses: Not on file  Food Insecurity:   . Worried About Charity fundraiser in the Last Year: Not on file  . Ran Out of Food in the Last Year: Not on file  Transportation Needs:   . Lack of Transportation (Medical): Not on file  . Lack of Transportation (Non-Medical): Not on file  Physical Activity:   . Days of Exercise per Week: Not on file  . Minutes of Exercise per Session: Not on file  Stress:   . Feeling of Stress : Not on file  Social Connections:   . Frequency of Communication with Friends and Family: Not on file  . Frequency of Social Gatherings with Friends and Family: Not on file  . Attends Religious Services: Not on file  . Active Member of Clubs or Organizations: Not on file  . Attends Archivist Meetings: Not on file  . Marital Status: Not on file   Family History  Problem Relation Age of Onset  . Hypertension Father   . Lung cancer Father   . Liver cancer Father   . Epilepsy Father   . Lung cancer Mother   . Hypertension Sister   . Osteoporosis Sister   . Hypertension Brother   .  Arthritis Brother   . Lung cancer Brother        small cell  . Colon cancer Son 63  . Diabetes Neg Hx    Allergies  Allergen Reactions  . Vicodin [Hydrocodone-Acetaminophen] Itching   Prior to Admission medications   Medication Sig Start Date End Date Taking? Authorizing Provider  CALCIUM PO Take by mouth.   Yes [provider]  Cholecalciferol (VITAMIN D3) 2000 UNITS TABS Take 1 tablet by mouth daily.   Yes [provider]  fluticasone (FLONASE) 50 MCG/ACT nasal spray Place 2 sprays into both nostrils daily. Patient taking differently: Place 2 sprays into both nostrils daily.  04/16/17  Yes Colin Benton R, DO  gabapentin (NEURONTIN) 300 MG capsule Take 3 capsules (900 mg total) by mouth 2 (two) times daily. 09/11/17  Yes Jaffe, Adam R, DO  hydrochlorothiazide (HYDRODIURIL) 25 MG tablet Take 1 tablet (25 mg total) by mouth daily. 01/13/19  Yes  Koberlein, Steele Berg, MD  Krill Oil 300 MG CAPS Take 1 capsule by mouth daily.   Yes [provider]  Multiple Vitamin (MULTIVITAMIN) tablet Take 1 tablet by mouth daily.   Yes [provider]  Red Yeast Rice Extract (RED YEAST RICE PO) Take 2,000 mg by mouth daily.   Yes [provider]  zoledronic acid (RECLAST) 5 MG/100ML SOLN injection Inject 100 mLs (5 mg total) into the vein once. 08/08/13  Yes Elayne Snare, MD     Positive ROS: Otherwise negative  All other systems have been reviewed and were otherwise negative with the exception of those mentioned in the HPI and as above.  Physical Exam: Constitutional: Alert, well-appearing, no acute distress Ears: External ears without lesions or tenderness. Ear canals are clear bilaterally with intact, clear TMs.  Nasal: External nose without lesions. Septum with minimal deformity.  She has some scabbing and crusting on the left side of the nostril.  The right side nostril also had minimal crusting but could not identify any definite prominent vessel or origin of her epistaxis.. Clear nasal passages otherwise.  No polyps no signs of infection. Did not perform any cauterization in the office today because I cannot identify definite site of origin upper epistaxis. Oral: Lips and gums without lesions. Tongue and palate mucosa without lesions. Posterior oropharynx clear. Neck: No palpable adenopathy or masses Respiratory: Breathing comfortably  Skin: No facial/neck lesions or rash noted.  Procedures  Assessment: Recurrent epistaxis from the right nostril  Plan: Reviewed with her concerning using ointment or Vaseline or nasal gel in the nose to keep it moist.  I would stop the Nasacort or Flonase unless she is having any trouble breathing through her nose as this may contribute some to epistaxis. She will follow-up for recheck for cauterization if she continues to have recurrent epistaxis.  However today she has had no  recent nosebleeds for several weeks and cannot identify definite site or origin of her epistaxis.   Radene Journey, MD   CC:

## 2019-03-04 DIAGNOSIS — Z86018 Personal history of other benign neoplasm: Secondary | ICD-10-CM | POA: Diagnosis not present

## 2019-03-04 DIAGNOSIS — D225 Melanocytic nevi of trunk: Secondary | ICD-10-CM | POA: Diagnosis not present

## 2019-03-04 DIAGNOSIS — L578 Other skin changes due to chronic exposure to nonionizing radiation: Secondary | ICD-10-CM | POA: Diagnosis not present

## 2019-03-04 DIAGNOSIS — L814 Other melanin hyperpigmentation: Secondary | ICD-10-CM | POA: Diagnosis not present

## 2019-03-04 DIAGNOSIS — Z23 Encounter for immunization: Secondary | ICD-10-CM | POA: Diagnosis not present

## 2019-03-04 DIAGNOSIS — L570 Actinic keratosis: Secondary | ICD-10-CM | POA: Diagnosis not present

## 2019-03-04 DIAGNOSIS — L821 Other seborrheic keratosis: Secondary | ICD-10-CM | POA: Diagnosis not present

## 2019-04-26 ENCOUNTER — Other Ambulatory Visit: Payer: Self-pay | Admitting: Endocrinology

## 2019-04-26 DIAGNOSIS — M810 Age-related osteoporosis without current pathological fracture: Secondary | ICD-10-CM

## 2019-04-26 DIAGNOSIS — E559 Vitamin D deficiency, unspecified: Secondary | ICD-10-CM

## 2019-04-28 ENCOUNTER — Other Ambulatory Visit (INDEPENDENT_AMBULATORY_CARE_PROVIDER_SITE_OTHER): Payer: Medicare Other

## 2019-04-28 ENCOUNTER — Other Ambulatory Visit: Payer: Self-pay

## 2019-04-28 DIAGNOSIS — E559 Vitamin D deficiency, unspecified: Secondary | ICD-10-CM | POA: Diagnosis not present

## 2019-04-28 DIAGNOSIS — M810 Age-related osteoporosis without current pathological fracture: Secondary | ICD-10-CM

## 2019-04-28 LAB — BASIC METABOLIC PANEL
BUN: 11 mg/dL (ref 6–23)
CO2: 30 mEq/L (ref 19–32)
Calcium: 9.4 mg/dL (ref 8.4–10.5)
Chloride: 99 mEq/L (ref 96–112)
Creatinine, Ser: 0.65 mg/dL (ref 0.40–1.20)
GFR: 90.22 mL/min (ref >60.00)
Glucose, Bld: 95 mg/dL (ref 70–99)
Potassium: 3.8 mEq/L (ref 3.5–5.1)
Sodium: 139 mEq/L (ref 135–145)

## 2019-04-28 LAB — VITAMIN D 25 HYDROXY (VIT D DEFICIENCY, FRACTURES): VITD: 47.23 ng/mL (ref 30.00–100.00)

## 2019-05-01 ENCOUNTER — Encounter: Payer: Self-pay | Admitting: Endocrinology

## 2019-05-01 ENCOUNTER — Telehealth: Payer: Self-pay

## 2019-05-01 ENCOUNTER — Other Ambulatory Visit: Payer: Self-pay

## 2019-05-01 ENCOUNTER — Ambulatory Visit (INDEPENDENT_AMBULATORY_CARE_PROVIDER_SITE_OTHER): Payer: Medicare Other | Admitting: Endocrinology

## 2019-05-01 VITALS — BP 140/70 | HR 75 | Ht 62.0 in | Wt 150.0 lb

## 2019-05-01 DIAGNOSIS — M810 Age-related osteoporosis without current pathological fracture: Secondary | ICD-10-CM | POA: Diagnosis not present

## 2019-05-01 NOTE — Progress Notes (Signed)
Patient ID: Dawn Reyes, female   DOB: 1951/01/07, 69 y.o.   MRN: [id number]   History of Present Illness:  OSTEOPOROSIS:   Background history includes menopause at the usual age of about 61+.   She was told by her gynecologist in 2006 that she had osteopenia on screening and bone density.  She  had a T score of -2.0 at the right right neck of femur but her spine was normal At some point the patient was given a trial of Fosamax which she took for about 2 years but stopped it because of difficulty with lower chest and upper abdominal discomfort when she would take this She also tried this again in 2014 but could not tolerate it for longer than 6 months  Recent history: In 2011 she was found to have a T score of -2.5 at the hip and  subsequently this was -2.7 at the left hip in 2014 She was given Atelvia in 2014 and she took this for 3-4 months and did not follow-up for a couple of years after that  She thinks she has lost only about a half an inch in height since her youth  Recently not complaining of any back pain recent history of fractures Her height is the same as before  She was given Reclast on 05/19/15, 05/09/16  05/15/2017 and in 06/2018  She had no side effects with her rate last infusions  She also has been taking the 2000 unit doses of vitamin D Her levels are adequate She takes calcium twice daily as before  She is now here for regular annual follow-up  Her bone density in 2018 showed the following changes:  04/25/2016 Lumbar spine (L1-L4) Femoral neck (FN) 33% distal radius  T-score -0.6 RFN:-2.4 LFN:-2.4 n/a  Change in BMD from previous DXA test (%) Up 5.6% Up 3.5% n/a    Vitamin D levels:  Lab Results  Component Value Date   VD25OH 47.23 04/28/2019   VD25OH 52.42 04/22/2018   VD25OH 42.61 04/20/2017     Past Medical History:  Diagnosis Date  . Anxiety   . Benign hypertension   . 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 Candidiasis of mouth    . 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 Fibromyalgia   . GERD (gastroesophageal reflux disease)   . Hematochezia 02/13/2012  . Left sided abdominal pain 02/13/2012  . Osteoporosis, unspecified 07/30/2012    Past Surgical History:  Procedure Laterality Date  . ABDOMINAL HYSTERECTOMY     no concern for cancer; menorrhagia  . BREAST LUMPECTOMY Right   . FOOT SURGERY Bilateral    mortons neuroma removal  . TONSILLECTOMY      Family History  Problem Relation Age of Onset  . Hypertension Father   . Lung cancer Father   . Liver cancer Father   . Epilepsy Father   . Lung cancer Mother   . Hypertension Sister   . Osteoporosis Sister   . Hypertension Brother   . Arthritis Brother   . Lung cancer Brother        small cell  . Colon cancer Son 41  . Diabetes Neg Hx     Social History:  reports that she has never smoked. She has never used smokeless tobacco. She reports that she does not drink alcohol or use drugs.  Allergies:  Allergies  Allergen Reactions  . Vicodin [Hydrocodone-Acetaminophen] Itching    Allergies as of 05/01/2019  Reactions   Vicodin [hydrocodone-acetaminophen] Itching      Medication List       Accurate as of May 01, 2019  8:29 AM. If you have any questions, ask your nurse or doctor.        CALCIUM PO Take by mouth.   fluticasone 50 MCG/ACT nasal spray Commonly known as: FLONASE Place 2 sprays into both nostrils daily.   gabapentin 300 MG capsule Commonly known as: NEURONTIN Take 3 capsules (900 mg total) by mouth 2 (two) times daily.   Krill Oil 300 MG Caps Take 1 capsule by mouth daily.   Microzide 12.5 MG capsule Generic drug: hydrochlorothiazide Take 12.5 mg by mouth daily. What changed: Another medication with the same name was removed. Continue taking this medication, and follow the directions you see here. Changed by: Elayne Snare, MD   multivitamin tablet Take 1 tablet by mouth daily.   RED  YEAST RICE PO Take 2,000 mg by mouth daily.   Vitamin D3 50 MCG (2000 UT) Tabs Take 1 tablet by mouth daily.   zoledronic acid 5 MG/100ML Soln injection Commonly known as: Reclast Inject 100 mLs (5 mg total) into the vein once.        REVIEW OF SYSTEMS:        She is on HCTZ for hypertension  Has a normal calcium levels      EXAM:  BP 140/70 (BP Location: Left Arm, Patient Position: Sitting, Cuff Size: Normal)   Pulse 75   Ht 5\' 2"  (1.575 m)   Wt 150 lb (68 kg)   SpO2 97%   BMI 27.44 kg/m   Her spine is normal shape with no tenderness or deformity No prominent spinous processes  ASSESSMENT:   History of osteoporosis, long-standing and asymptomatic Has had insignificant height loss previously  She has mild osteoporosis at the left neck of the femur with lowest T score -2.7 in 2014 which improved to -2.4 in 2018 She has tolerated Reclast annually, last injection in June 2020  No recent history of any fractures or bone pain  Her vitamin D level is again normal with 2000 units of vitamin D3 Calcium level normal    PLAN:   Needs to have repeat dose of Reclast in 06/2019 Will subsequently recheck bone density next year and further treatment to be decided  Elayne Snare 05/01/2019, 8:29 AM

## 2019-05-01 NOTE — Telephone Encounter (Signed)
Called pt's pharmacy to find out who her benefits manager is for her Medicare plan. Pharmacy reported that she has always used copay cards, and they do not have a Technical sales engineer on file.  Called pt and informed her of this and she is going to contact her insurance company to find out.  Also called pt's supplement insurance and they stated that they cover whatever Medicare covers. I was unable to get through with Medicare.  Will wait for pt to contact back with more information.

## 2019-05-07 NOTE — Telephone Encounter (Signed)
Called pt to follow up regarding her coverage for Reclast, and she stated that Medicare told her that it was covered in full under Medicare part B and that she would not owe anything out of pocket. Pt was informed that I would contact her soon to schedule this. She verbalized understanding.

## 2019-05-12 ENCOUNTER — Other Ambulatory Visit: Payer: Self-pay | Admitting: Family Medicine

## 2019-05-12 DIAGNOSIS — Z1231 Encounter for screening mammogram for malignant neoplasm of breast: Secondary | ICD-10-CM

## 2019-06-04 ENCOUNTER — Ambulatory Visit
Admission: RE | Admit: 2019-06-04 | Discharge: 2019-06-04 | Disposition: A | Discharge status: Home / Self Care | Payer: Medicare Other | Source: Ambulatory Visit | Attending: Family Medicine | Admitting: Family Medicine

## 2019-06-04 ENCOUNTER — Other Ambulatory Visit: Payer: Self-pay

## 2019-06-04 DIAGNOSIS — Z1231 Encounter for screening mammogram for malignant neoplasm of breast: Secondary | ICD-10-CM

## 2019-06-20 NOTE — Telephone Encounter (Signed)
Dr. Dwyane Dee, please order any bloodwork you would like prior to pt's Reclast infusion. She is scheduled to come for bloodwork on 06/23/19

## 2019-06-23 ENCOUNTER — Other Ambulatory Visit: Payer: Self-pay

## 2019-06-23 ENCOUNTER — Other Ambulatory Visit: Payer: Self-pay | Admitting: Endocrinology

## 2019-06-23 ENCOUNTER — Other Ambulatory Visit (INDEPENDENT_AMBULATORY_CARE_PROVIDER_SITE_OTHER): Payer: Medicare Other

## 2019-06-23 DIAGNOSIS — M810 Age-related osteoporosis without current pathological fracture: Secondary | ICD-10-CM

## 2019-06-23 DIAGNOSIS — E559 Vitamin D deficiency, unspecified: Secondary | ICD-10-CM

## 2019-06-23 LAB — BASIC METABOLIC PANEL
BUN: 15 mg/dL (ref 6–23)
CO2: 32 mEq/L (ref 19–32)
Calcium: 9.7 mg/dL (ref 8.4–10.5)
Chloride: 96 mEq/L (ref 96–112)
Creatinine, Ser: 0.68 mg/dL (ref 0.40–1.20)
GFR: 85.61 mL/min (ref >60.00)
Glucose, Bld: 97 mg/dL (ref 70–99)
Potassium: 3.2 mEq/L — ABNORMAL LOW (ref 3.5–5.1)
Sodium: 137 mEq/L (ref 135–145)

## 2019-06-25 ENCOUNTER — Other Ambulatory Visit: Payer: Self-pay | Admitting: Family Medicine

## 2019-06-25 DIAGNOSIS — E876 Hypokalemia: Secondary | ICD-10-CM

## 2019-06-25 MED ORDER — POTASSIUM CHLORIDE ER 10 MEQ PO TBCR: 10.0000 meq | EXTENDED_RELEASE_TABLET | Freq: Every day | ORAL | 1 refills | Status: DC

## 2019-06-26 DIAGNOSIS — H2513 Age-related nuclear cataract, bilateral: Secondary | ICD-10-CM | POA: Diagnosis not present

## 2019-07-03 ENCOUNTER — Other Ambulatory Visit: Payer: Self-pay | Admitting: Family Medicine

## 2019-07-03 NOTE — Telephone Encounter (Signed)
Please check with patient - I believe she stopped the hctz. Pharmacy has requested a refill.

## 2019-07-04 ENCOUNTER — Other Ambulatory Visit: Payer: Self-pay

## 2019-07-04 ENCOUNTER — Ambulatory Visit (INDEPENDENT_AMBULATORY_CARE_PROVIDER_SITE_OTHER): Payer: Medicare Other

## 2019-07-04 VITALS — BP 130/78 | HR 66 | Ht 62.0 in | Wt 148.6 lb

## 2019-07-04 DIAGNOSIS — M810 Age-related osteoporosis without current pathological fracture: Secondary | ICD-10-CM

## 2019-07-04 MED ORDER — ZOLEDRONIC ACID 5 MG/100ML IV SOLN
5.0000 mg | Freq: Once | INTRAVENOUS | Status: AC
Start: 2019-07-04 — End: 2019-07-04
  Administered 2019-07-04: 5 mg via INTRAVENOUS

## 2019-07-04 NOTE — Telephone Encounter (Signed)
Noted  

## 2019-07-04 NOTE — Telephone Encounter (Signed)
Left a detailed message with the information below at the pts cell number and asked that she return a call with further info.

## 2019-07-04 NOTE — Telephone Encounter (Signed)
Pt called back and stated she does not need a refill. Per pt, she is taking her blood pressure medication.

## 2019-07-04 NOTE — Progress Notes (Signed)
  Reclast Infusion  Patient presented to office at for Reclast infusion for Osteoporosis.  Pt was weighed and noted to be 148.6 lbs. Pt then taken to infusion room. Vital signs assessed and noted to be WNL. BP- 128/80 taken sitting in left arm, P- 69, normal rate and rhythm, O2- 97%,RR- 16. IV was started at 0925 am in right arm using 20 gauge, 1 inch catheter. IV was inserted and flashback noted upon insertion. IV secured and flushed using 10cc NS flush. Insertion site inspected and noted to be clean and dry. No erythema noted.  IV tubing was primed and connected to IV catheter and set to infuse over 30-35 minutes. Infused Reclast 5mg /145mL beginning at 0930. Pt voiced no complaints or concerns. No s/s of infiltration noted.  Assessed access site at 0951 , and site noted to be patent, and infusing well.  Infusion completed at 1010. Access site was flushed with additional 10cc of NS, and then discontinued. Catheter noted to be intact upon inspection.  Vital signs assessed after completion and noted to be WNL. BP- 130/78, P- 66, normal rate and rhythm, O2- 98,RR- 18. Pt voiced no complaints or concerns at this time. Pt was then discharged from office. The patient was encouraged to drink 4-5 glasses of water today and reminded to remain hydrated, as well as to continue to take her calcium and vitamin D suppliments as directed by Dr. Dwyane Dee. Patient was encouraged to call this office for any questions at all, if she should have any. J.Murphy,RN on site for supervision. Sent to Dr. Kelton Pillar, who is on call this week for cosigning in Dr. Ronnie Derby absence.

## 2019-07-07 DIAGNOSIS — M810 Age-related osteoporosis without current pathological fracture: Secondary | ICD-10-CM | POA: Diagnosis not present

## 2019-07-07 MED ORDER — ZOLEDRONIC ACID 5 MG/100ML IV SOLN
5.0000 mg | Freq: Once | INTRAVENOUS | Status: AC
  Administered 2019-07-07: 5 mg via INTRAVENOUS

## 2019-07-07 NOTE — Addendum Note (Signed)
Addended by: Wyatt Haste F on: 07/07/2019 10:18 AM   Modules accepted: Orders

## 2019-07-18 ENCOUNTER — Other Ambulatory Visit: Payer: Self-pay

## 2019-07-18 ENCOUNTER — Other Ambulatory Visit (INDEPENDENT_AMBULATORY_CARE_PROVIDER_SITE_OTHER): Payer: Medicare Other

## 2019-07-18 DIAGNOSIS — E876 Hypokalemia: Secondary | ICD-10-CM | POA: Diagnosis not present

## 2019-07-18 LAB — BASIC METABOLIC PANEL
BUN: 18 mg/dL (ref 6–23)
CO2: 31 mEq/L (ref 19–32)
Calcium: 9.3 mg/dL (ref 8.4–10.5)
Chloride: 98 mEq/L (ref 96–112)
Creatinine, Ser: 0.73 mg/dL (ref 0.40–1.20)
GFR: 78.86 mL/min (ref >60.00)
Glucose, Bld: 96 mg/dL (ref 70–99)
Potassium: 3.8 mEq/L (ref 3.5–5.1)
Sodium: 138 mEq/L (ref 135–145)

## 2019-08-07 ENCOUNTER — Telehealth: Payer: Self-pay | Admitting: Neurology

## 2019-08-07 NOTE — Telephone Encounter (Signed)
Patient is requesting a refill of Gabapentin be sent to [facility] in . She has a follow up sch with Jaffe 12/17/19.

## 2019-08-07 NOTE — Telephone Encounter (Signed)
I haven't seen Dawn Reyes in 2 years next month.  I don't feel comfortable refilling medications if I haven't seen a patient in that long.  She has an appointment with me in December.  I have reached out and left a message with her PCP, Dr. Ethlyn Gallery, to see if she would refill the gabapentin until her follow up appointment with me.  I suggest that she contact her PCP's office.

## 2019-08-07 NOTE — Telephone Encounter (Signed)
LMOCM to call back.

## 2019-08-08 ENCOUNTER — Other Ambulatory Visit: Payer: Self-pay | Admitting: Family Medicine

## 2019-08-08 MED ORDER — GABAPENTIN 300 MG PO CAPS: 900.0000 mg | ORAL_CAPSULE | Freq: Two times a day (BID) | ORAL | 1 refills | Status: DC

## 2019-12-16 NOTE — Progress Notes (Signed)
NEUROLOGY FOLLOW UP OFFICE NOTE  Dawn Reyes [id number]   Subjective:  Dawn Reyes is a 69 year old woman with hypertension, osteoarthritis, depression and fibromyalgia who follows up for restless leg syndrome and burning mouth syndrome.  Last seen in August 2019.  I  RESTLESS LEG SYNDROME: Update: Current medication:  gabapentin 900mg  in afternoon and 900mg  at bedtime. Stable at this time.  History: Onset of symptoms occurred in 2013. The symptoms correlate soon after the dental procedure involving 2 canals of 2 of her right-sided upper teeth. She did see the dentist for this, who said that her symptoms are unlikely related. She notes a "discomfort "on the left side of her face, involving the maxilla to the temple. It is not really a pain. However, she will occasionally have a brief shooting sensation, about a 5/10. It is sensitive to the touch. It appears to be sensitive when she is putting on her makeup. She is never in extreme pain however. She also has a burning sensation of her tongue. This also is a 5/10 in intensity. It is less painful when she is eating or drinking.  Lidocaine mouth rinse and ice provide brief relief  II BURNING MOUTH SYNDROME: Update: Current medication:  gabapentin 900mg  in afternoon and 900mg  at bedtime She is doing well.  It is controlled.  History: She has had it for several years.  It occurs in the evening and in bed.  When she is in bed or sitting still watching TV, she would experience discomfort in the legs that is relieved by movement.  PAST MEDICAL HISTORY: Past Medical History:  Diagnosis Date  . Anxiety   . Benign hypertension   . 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 Candidiasis of mouth   . 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 Fibromyalgia   . GERD (gastroesophageal reflux disease)   . Hematochezia 02/13/2012  . Left sided abdominal pain 02/13/2012  . Osteoporosis, unspecified 07/30/2012     MEDICATIONS: Current Outpatient Medications on File Prior to Visit  Medication Sig Dispense Refill  . CALCIUM PO Take by mouth.    . Cholecalciferol (VITAMIN D3) 2000 UNITS TABS Take 1 tablet by mouth daily.    . fluticasone (FLONASE) 50 MCG/ACT nasal spray Place 2 sprays into both nostrils daily. (Patient taking differently: Place 2 sprays into both nostrils daily. ) 16 g 6  . gabapentin (NEURONTIN) 300 MG capsule Take 3 capsules (900 mg total) by mouth 2 (two) times daily. 540 capsule 1  . hydrochlorothiazide (MICROZIDE) 12.5 MG capsule Take 12.5 mg by mouth daily.    Javier Docker Oil 300 MG CAPS Take 1 capsule by mouth daily.    . Multiple Vitamin (MULTIVITAMIN) tablet Take 1 tablet by mouth daily.    . potassium chloride (KLOR-CON) 10 MEQ tablet Take 1 tablet (10 mEq total) by mouth daily. 90 tablet 1  . Red Yeast Rice Extract (RED YEAST RICE PO) Take 2,000 mg by mouth daily.    . zoledronic acid (RECLAST) 5 MG/100ML SOLN injection Inject 100 mLs (5 mg total) into the vein once. 100 mL 0   No current facility-administered medications on file prior to visit.    ALLERGIES: Allergies  Allergen Reactions  . Vicodin [Hydrocodone-Acetaminophen] Itching    FAMILY HISTORY: Family History  Problem Relation Age of Onset  . Hypertension Father   . Lung cancer Father   . Liver cancer Father   . Epilepsy Father   .  Lung cancer Mother   . Hypertension Sister   . Osteoporosis Sister   . Hypertension Brother   . Arthritis Brother   . Lung cancer Brother        small cell  . Colon cancer Son 50  . Diabetes Neg Hx     SOCIAL HISTORY: Social History   Socioeconomic History  . Marital status: Widowed    Spouse name: Not on file  . Number of children: 2  . Years of education: Not on file  . Highest education level: Not on file  Occupational History  . Occupation: CSR    Employer: [employer]  Tobacco Use  . Smoking status: Never Smoker  . Smokeless tobacco: Never  Used  Vaping Use  . Vaping Use: Never used  Substance and Sexual Activity  . Alcohol use: No    Alcohol/week: 0.0 standard drinks  . Drug use: No  . Sexual activity: Never  Other Topics Concern  . Not on file  Social History Narrative   Work or School: [employer]      Home Situation: lives with husband      Spiritual Beliefs: Baptist      Lifestyle: no regular exercise; diet is ok               Social Determinants of Radio broadcast assistant Strain:   . Difficulty of Paying Living Expenses: Not on file  Food Insecurity:   . Worried About Charity fundraiser in the Last Year: Not on file  . Ran Out of Food in the Last Year: Not on file  Transportation Needs:   . Lack of Transportation (Medical): Not on file  . Lack of Transportation (Non-Medical): Not on file  Physical Activity:   . Days of Exercise per Week: Not on file  . Minutes of Exercise per Session: Not on file  Stress:   . Feeling of Stress : Not on file  Social Connections:   . Frequency of Communication with Friends and Family: Not on file  . Frequency of Social Gatherings with Friends and Family: Not on file  . Attends Religious Services: Not on file  . Active Member of Clubs or Organizations: Not on file  . Attends Archivist Meetings: Not on file  . Marital Status: Not on file  Intimate Partner Violence:   . Fear of Current or Ex-Partner: Not on file  . Emotionally Abused: Not on file  . Physically Abused: Not on file  . Sexually Abused: Not on file     Objective:  Blood pressure (!) 188/84, pulse 83, height 5\' 2"  (1.575 m), weight 151 lb (68.5 kg), SpO2 93 %. General: No acute distress.  Patient appears well-groomed.   Head:  Normocephalic/atraumatic Eyes:  Fundi examined but not visualized Neurological Exam: alert and oriented to person, place, and time. Attention span and concentration intact, recent and remote memory intact, fund of knowledge intact.  Speech fluent and  not dysarthric, language intact.  CN II-XII intact. Bulk and tone normal, muscle strength 5/5 throughout.  Sensation to light touch, temperature and vibration intact.  Deep tendon reflexes 2+ throughout, toes downgoing.  Finger to nose and heel to shin testing intact.  Gait normal, Romberg negative.   Assessment/Plan:   1.  Restless leg syndrome 2.  Burning mouth syndrome 3.  Hypertension  1.  Gabapentin 900mg  twice daily refilled 2.  Follow up with PCP regarding blood pressure 3.  Follow  up one year  Metta Clines, DO  CC: Micheline Rough, MD

## 2019-12-17 ENCOUNTER — Encounter: Payer: Self-pay | Admitting: Family Medicine

## 2019-12-17 ENCOUNTER — Other Ambulatory Visit: Payer: Self-pay

## 2019-12-17 ENCOUNTER — Ambulatory Visit (INDEPENDENT_AMBULATORY_CARE_PROVIDER_SITE_OTHER): Payer: Medicare Other | Admitting: Neurology

## 2019-12-17 ENCOUNTER — Encounter: Payer: Self-pay | Admitting: Neurology

## 2019-12-17 VITALS — BP 188/84 | HR 83 | Ht 62.0 in | Wt 151.0 lb

## 2019-12-17 DIAGNOSIS — K146 Glossodynia: Secondary | ICD-10-CM

## 2019-12-17 DIAGNOSIS — I10 Essential (primary) hypertension: Secondary | ICD-10-CM

## 2019-12-17 DIAGNOSIS — G2581 Restless legs syndrome: Secondary | ICD-10-CM | POA: Diagnosis not present

## 2019-12-17 MED ORDER — GABAPENTIN 300 MG PO CAPS: 900.0000 mg | ORAL_CAPSULE | Freq: Two times a day (BID) | ORAL | 1 refills | Status: DC

## 2019-12-17 NOTE — Patient Instructions (Signed)
Gabapentin refilled

## 2019-12-19 MED ORDER — HYDROCHLOROTHIAZIDE 12.5 MG PO CAPS: 12.5000 mg | ORAL_CAPSULE | Freq: Every day | ORAL | 0 refills | Status: DC

## 2020-01-01 ENCOUNTER — Telehealth: Payer: Self-pay | Admitting: Neurology

## 2020-01-01 MED ORDER — GABAPENTIN 300 MG PO CAPS: 900.0000 mg | ORAL_CAPSULE | Freq: Two times a day (BID) | ORAL | 2 refills | Status: DC

## 2020-01-01 NOTE — Telephone Encounter (Signed)
°

## 2020-01-01 NOTE — Telephone Encounter (Signed)
Rx(s) sent to pharmacy electronically.  

## 2020-01-05 ENCOUNTER — Other Ambulatory Visit: Payer: Self-pay

## 2020-01-05 ENCOUNTER — Telehealth: Payer: Self-pay | Admitting: Neurology

## 2020-01-05 ENCOUNTER — Ambulatory Visit (INDEPENDENT_AMBULATORY_CARE_PROVIDER_SITE_OTHER): Payer: Medicare Other | Admitting: Family Medicine

## 2020-01-05 ENCOUNTER — Encounter: Payer: Self-pay | Admitting: Family Medicine

## 2020-01-05 VITALS — BP 128/84 | HR 69 | Temp 97.8°F | Ht 61.5 in | Wt 148.3 lb

## 2020-01-05 DIAGNOSIS — E785 Hyperlipidemia, unspecified: Secondary | ICD-10-CM | POA: Diagnosis not present

## 2020-01-05 DIAGNOSIS — R739 Hyperglycemia, unspecified: Secondary | ICD-10-CM

## 2020-01-05 DIAGNOSIS — M2550 Pain in unspecified joint: Secondary | ICD-10-CM

## 2020-01-05 DIAGNOSIS — I10 Essential (primary) hypertension: Secondary | ICD-10-CM | POA: Diagnosis not present

## 2020-01-05 LAB — COMPREHENSIVE METABOLIC PANEL
ALT: 19 U/L (ref 0–35)
AST: 26 U/L (ref 0–37)
Albumin: 4.5 g/dL (ref 3.5–5.2)
Alkaline Phosphatase: 38 U/L — ABNORMAL LOW (ref 39–117)
BUN: 9 mg/dL (ref 6–23)
CO2: 31 mEq/L (ref 19–32)
Calcium: 9.4 mg/dL (ref 8.4–10.5)
Chloride: 101 mEq/L (ref 96–112)
Creatinine, Ser: 0.75 mg/dL (ref 0.40–1.20)
GFR: 80.75 mL/min (ref >60.00)
Glucose, Bld: 92 mg/dL (ref 70–99)
Potassium: 3.7 mEq/L (ref 3.5–5.1)
Sodium: 139 mEq/L (ref 135–145)
Total Bilirubin: 0.6 mg/dL (ref 0.2–1.2)
Total Protein: 6.6 g/dL (ref 6.0–8.3)

## 2020-01-05 LAB — C-REACTIVE PROTEIN: CRP: <1 mg/dL (ref 0.5–20.0)

## 2020-01-05 LAB — LIPID PANEL
Cholesterol: 205 mg/dL — ABNORMAL HIGH (ref 0–200)
HDL: 71.7 mg/dL (ref >39.00)
LDL Cholesterol: 117 mg/dL — ABNORMAL HIGH (ref 0–99)
NonHDL: 133.3
Total CHOL/HDL Ratio: 3
Triglycerides: 84 mg/dL (ref 0.0–149.0)
VLDL: 16.8 mg/dL (ref 0.0–40.0)

## 2020-01-05 LAB — HEMOGLOBIN A1C: Hgb A1c MFr Bld: 5.6 % (ref 4.6–6.5)

## 2020-01-05 LAB — CBC WITH DIFFERENTIAL/PLATELET
Basophils Absolute: 0 10*3/uL (ref 0.0–0.1)
Basophils Relative: 0.6 % (ref 0.0–3.0)
Eosinophils Absolute: 0.2 10*3/uL (ref 0.0–0.7)
Eosinophils Relative: 3.5 % (ref 0.0–5.0)
HCT: 39.7 % (ref 36.0–46.0)
Hemoglobin: 13.4 g/dL (ref 12.0–15.0)
Lymphocytes Relative: 33.3 % (ref 12.0–46.0)
Lymphs Abs: 1.7 10*3/uL (ref 0.7–4.0)
MCHC: 33.8 g/dL (ref 30.0–36.0)
MCV: 93.2 fl (ref 78.0–100.0)
Monocytes Absolute: 0.3 10*3/uL (ref 0.1–1.0)
Monocytes Relative: 5.3 % (ref 3.0–12.0)
Neutro Abs: 2.9 10*3/uL (ref 1.4–7.7)
Neutrophils Relative %: 57.3 % (ref 43.0–77.0)
Platelets: 322 10*3/uL (ref 150.0–400.0)
RBC: 4.26 Mil/uL (ref 3.87–5.11)
RDW: 14 % (ref 11.5–15.5)
WBC: 5 10*3/uL (ref 4.0–10.5)

## 2020-01-05 LAB — SEDIMENTATION RATE: Sed Rate: 3 mm/hr (ref 0–30)

## 2020-01-05 MED ORDER — SHINGRIX 50 MCG/0.5ML IM SUSR
0.5000 mL | Freq: Once | INTRAMUSCULAR | 0 refills | Status: AC
Start: 2020-01-05 — End: 2020-01-05

## 2020-01-05 MED ORDER — GABAPENTIN 300 MG PO CAPS: 900.0000 mg | ORAL_CAPSULE | Freq: Two times a day (BID) | ORAL | 0 refills | Status: DC

## 2020-01-05 NOTE — Progress Notes (Signed)
Dawn Reyes DOB: September 19, 1950 Encounter date: 01/05/2020  This is a 69 y.o. female who presents with Chief Complaint  Patient presents with  . Annual Exam    History of present illness: Last visit was 12/20/2018.  Having more joint pain - right hand, knee (left more than right), right ankle. Does get swelling  In knee and ankle. Saw specialist for hand, but she didn't want surgery. Bothers her more mid day - worse when on it; swells more when on it. Stopping her from activities. Left hip also bothers her; mild in right. Has tried voltaren gel, but didn't provide lasting relief.   HTN:on hctz 25 - when she ran out, she used daughters 12.5mg  dose for awhile. She is getting good numbers (similar to today's readings and no high).   Fibromyalgia: states that her body hurts, but this is something that she has gotten used to. doesn't feel like this hurts all the time; maybe used to it. Does a lot of woodworking.   Osteoporosis:reclast, vitamin D, calcium.   On the gabapentin for restless legs, burning tongue. Has had these for years.doing ok with this. states not worse. Still nights that they are uncomfortable.   Mammogram ok from 03/2018. Repeat 1 year. Colonoscopy completed 08/2017; repeat in 5 year (Dr. Henrene Pastor). Patient states she doesn't want repeat.   Didn't complete shingles vaccine because they were out to lunch, but she wants to.   Doesn't want flu and pneumonia vaccines. Feels that immune system is very strong.   Not exercising on regular basis.   Not having hot flashes regularly.  No nose bleeds really since seeing specialist.   No issues with acid reflux.    Allergies  Allergen Reactions  . Vicodin [Hydrocodone-Acetaminophen] Itching   Current Meds  Medication Sig  . CALCIUM PO Take by mouth.  . Cholecalciferol (VITAMIN D3) 2000 UNITS TABS Take 1 tablet by mouth daily.  . fluticasone (FLONASE) 50 MCG/ACT nasal spray Place 2 sprays into both nostrils daily.  (Patient taking differently: Place 2 sprays into both nostrils daily.)  . gabapentin (NEURONTIN) 300 MG capsule Take 3 capsules (900 mg total) by mouth 2 (two) times daily.  . hydrochlorothiazide (MICROZIDE) 12.5 MG capsule Take 1 capsule (12.5 mg total) by mouth daily.  Javier Docker Oil 300 MG CAPS Take 1 capsule by mouth daily.  . Multiple Vitamin (MULTIVITAMIN) tablet Take 1 tablet by mouth daily.  . potassium chloride (KLOR-CON) 10 MEQ tablet Take 1 tablet (10 mEq total) by mouth daily.  . Red Yeast Rice Extract (RED YEAST RICE PO) Take 2,000 mg by mouth daily.  . zoledronic acid (RECLAST) 5 MG/100ML SOLN injection Inject 100 mLs (5 mg total) into the vein once.  . Zoster Vaccine Adjuvanted [facility]) injection Inject 0.5 mLs into the muscle once for 1 dose. Repeat in 2-6 months    Review of Systems  Constitutional: Negative for activity change, appetite change, chills, fatigue, fever and unexpected weight change.  HENT: Negative for congestion, ear pain, hearing loss, sinus pressure, sinus pain, sore throat and trouble swallowing.   Eyes: Negative for pain and visual disturbance.  Respiratory: Negative for cough, chest tightness, shortness of breath and wheezing.   Cardiovascular: Negative for chest pain, palpitations and leg swelling.  Gastrointestinal: Negative for abdominal pain, blood in stool, constipation, diarrhea, nausea and vomiting.  Genitourinary: Negative for difficulty urinating and menstrual problem.  Musculoskeletal: Positive for arthralgias, back pain, gait problem and joint swelling.  Skin: Negative for rash.  Neurological:  Positive for headaches (not regular; but in temples when they occur). Negative for dizziness, weakness and numbness.  Hematological: Negative for adenopathy. Does not bruise/bleed easily.  Psychiatric/Behavioral: Negative for sleep disturbance and suicidal ideas. The patient is not nervous/anxious.     Objective:  BP 128/84 (BP Location: Left Arm,  Patient Position: Sitting, Cuff Size: Normal)   Pulse 69   Temp 97.8 F (36.6 C) (Oral)   Ht 5' 1.5" (1.562 m)   Wt 148 lb 4.8 oz (67.3 kg)   BMI 27.57 kg/m   Weight: 148 lb 4.8 oz (67.3 kg)   BP Readings from Last 3 Encounters:  01/05/20 128/84  12/17/19 (!) 188/84  07/04/19 130/78   Wt Readings from Last 3 Encounters:  01/05/20 148 lb 4.8 oz (67.3 kg)  12/17/19 151 lb (68.5 kg)  07/04/19 148 lb 9.6 oz (67.4 kg)    Physical Exam Constitutional:      General: She is not in acute distress.    Appearance: She is well-developed and well-nourished.  HENT:     Head: Normocephalic and atraumatic.     Right Ear: External ear normal.     Left Ear: External ear normal.     Mouth/Throat:     Mouth: Oropharynx is clear and moist.     Pharynx: No oropharyngeal exudate.  Eyes:     Conjunctiva/sclera: Conjunctivae normal.     Pupils: Pupils are equal, round, and reactive to light.  Neck:     Thyroid: No thyromegaly.  Cardiovascular:     Rate and Rhythm: Normal rate and regular rhythm.     Heart sounds: Normal heart sounds. No murmur heard. No friction rub. No gallop.   Pulmonary:     Effort: Pulmonary effort is normal.     Breath sounds: Normal breath sounds.  Abdominal:     General: Bowel sounds are normal. There is no distension.     Palpations: Abdomen is soft. There is no mass.     Tenderness: There is no abdominal tenderness. There is no guarding.     Hernia: No hernia is present.  Musculoskeletal:        General: No tenderness, deformity or edema. Normal range of motion.     Cervical back: Normal range of motion and neck supple.     Comments: There is some general edema of the right ankle.  She does have tenderness to palpation in this area.  She has some subpatellar tenderness of the left knee.  She has left greater trochanteric tenderness to palpation as well as IT band tenderness.  No significant restriction with range of motion of the hip, but does have lateral hip  tenderness with internal rotation.  Lymphadenopathy:     Cervical: No cervical adenopathy.  Skin:    General: Skin is warm and dry.     Findings: No rash.  Neurological:     Mental Status: She is alert and oriented to person, place, and time.     Deep Tendon Reflexes: Strength normal. Reflexes normal.     Reflex Scores:      Tricep reflexes are 2+ on the right side and 2+ on the left side.      Bicep reflexes are 2+ on the right side and 2+ on the left side.      Brachioradialis reflexes are 2+ on the right side and 2+ on the left side.      Patellar reflexes are 2+ on the right side and 2+ on the left  side. Psychiatric:        Mood and Affect: Mood and affect normal.        Speech: Speech normal.        Behavior: Behavior normal.        Thought Content: Thought content normal.     Assessment/Plan  1. Arthralgia, unspecified joint She did have some improvement with trochanteric bursa injection in the past, but pain is returned.  I did suggest consideration of physical therapy as I think this would be helpful for her in order to help with mobility and flexibility.  I am also going to check for any underlying inflammation as this would modify treatment for her. - ANA; Future - C-reactive protein; Future - Sedimentation rate; Future - ANA - C-reactive protein - Sedimentation rate  2. Essential hypertension, benign Well controlled; continue current medication hydrochlorothiazide 12.5 mg daily.  We did discuss hypokalemia that she has been having.  If she continues to have low potassium, we certainly can consider changing her blood pressure medication.  If potassium levels are significantly improved, we can do a trial period Without potassium supplementation to see if she is able to maintain this with diet alone. - Comprehensive metabolic panel; Future - CBC with Differential/Platelet; Future - Comprehensive metabolic panel - CBC with Differential/Platelet   3. Dyslipidemia Has  been well controlled with red yeast rice. - Lipid panel; Future - Lipid panel  5. Hyperglycemia Has been diet controlled.  Recheck blood work today. - Hemoglobin A1c; Future - Hemoglobin A1c   Time of office visit, charting, precharting 39 minutes total. Return in about 6 months (around 07/05/2020) for Chronic condition visit.    Theodis Shove, MD

## 2020-01-05 NOTE — Patient Instructions (Signed)
Tart cherry capsules - is a good anti-inflammatory.

## 2020-01-07 ENCOUNTER — Other Ambulatory Visit: Payer: Self-pay | Admitting: *Deleted

## 2020-01-07 DIAGNOSIS — E876 Hypokalemia: Secondary | ICD-10-CM

## 2020-01-07 LAB — ANA: Anti Nuclear Antibody (ANA): NEGATIVE

## 2020-01-07 MED ORDER — POTASSIUM CHLORIDE ER 10 MEQ PO TBCR: 10.0000 meq | EXTENDED_RELEASE_TABLET | Freq: Every day | ORAL | 1 refills | Status: DC

## 2020-01-07 MED ORDER — HYDROCHLOROTHIAZIDE 12.5 MG PO CAPS: 12.5000 mg | ORAL_CAPSULE | Freq: Every day | ORAL | 1 refills | Status: DC

## 2020-01-07 NOTE — Telephone Encounter (Signed)
Rx done. 

## 2020-02-18 DIAGNOSIS — L814 Other melanin hyperpigmentation: Secondary | ICD-10-CM | POA: Diagnosis not present

## 2020-02-18 DIAGNOSIS — L820 Inflamed seborrheic keratosis: Secondary | ICD-10-CM | POA: Diagnosis not present

## 2020-02-18 DIAGNOSIS — D225 Melanocytic nevi of trunk: Secondary | ICD-10-CM | POA: Diagnosis not present

## 2020-02-18 DIAGNOSIS — L821 Other seborrheic keratosis: Secondary | ICD-10-CM | POA: Diagnosis not present

## 2020-02-27 ENCOUNTER — Other Ambulatory Visit: Payer: Self-pay

## 2020-03-01 ENCOUNTER — Other Ambulatory Visit: Payer: Self-pay

## 2020-03-01 ENCOUNTER — Ambulatory Visit (INDEPENDENT_AMBULATORY_CARE_PROVIDER_SITE_OTHER): Payer: Medicare HMO | Admitting: Family Medicine

## 2020-03-01 ENCOUNTER — Encounter: Payer: Self-pay | Admitting: Family Medicine

## 2020-03-01 ENCOUNTER — Ambulatory Visit (INDEPENDENT_AMBULATORY_CARE_PROVIDER_SITE_OTHER): Payer: Medicare HMO

## 2020-03-01 VITALS — BP 136/80 | HR 72 | Temp 98.2°F | Ht 61.5 in | Wt 150.5 lb

## 2020-03-01 DIAGNOSIS — M1712 Unilateral primary osteoarthritis, left knee: Secondary | ICD-10-CM | POA: Diagnosis not present

## 2020-03-01 DIAGNOSIS — M7062 Trochanteric bursitis, left hip: Secondary | ICD-10-CM

## 2020-03-01 DIAGNOSIS — M25562 Pain in left knee: Secondary | ICD-10-CM

## 2020-03-01 DIAGNOSIS — M7632 Iliotibial band syndrome, left leg: Secondary | ICD-10-CM | POA: Diagnosis not present

## 2020-03-01 MED ORDER — TRIAMCINOLONE ACETONIDE 40 MG/ML IJ SUSP
40.0000 mg | Freq: Once | INTRAMUSCULAR | Status: AC
Start: 2020-03-01 — End: 2020-03-01
  Administered 2020-03-01: 40 mg via INTRA_ARTICULAR

## 2020-03-01 NOTE — Progress Notes (Signed)
Dawn Reyes DOB: Jul 30, 1950 Encounter date: 03/01/2020  This is a 70 y.o. female who presents with Chief Complaint  Patient presents with  . Hip Pain    Left hip pain for xyears, seen orthopedic doctor years ago, does not claim to have injured hip.    History of present illness: Left hip hurting down back of leg and left knee.   Takes naproxen before she goes to bed because if she wakes up then hip is sore. Left hip, lateral, posterior. No pain in right hip typically. Hurts in butt when sitting - like she is sitting on ball. Hurts down leg to upper calf.   Last trochanteric bursa shot was 12/2018. She had good improvement with this.   Squatting, sitting are the worst. Hurts at night regardless of position; worse if she rolls on side. Some restriction.    Allergies  Allergen Reactions  . Vicodin [Hydrocodone-Acetaminophen] Itching   Current Meds  Medication Sig  . CALCIUM PO Take by mouth.  . Cholecalciferol (VITAMIN D3) 2000 UNITS TABS Take 1 tablet by mouth daily.  . fluticasone (FLONASE) 50 MCG/ACT nasal spray Place 2 sprays into both nostrils daily. (Patient taking differently: Place 2 sprays into both nostrils daily.)  . gabapentin (NEURONTIN) 300 MG capsule Take 3 capsules (900 mg total) by mouth 2 (two) times daily.  . hydrochlorothiazide (MICROZIDE) 12.5 MG capsule Take 1 capsule (12.5 mg total) by mouth daily.  Javier Docker Oil 300 MG CAPS Take 1 capsule by mouth daily.  . Multiple Vitamin (MULTIVITAMIN) tablet Take 1 tablet by mouth daily.  . potassium chloride (KLOR-CON) 10 MEQ tablet Take 1 tablet (10 mEq total) by mouth daily.  . Red Yeast Rice Extract (RED YEAST RICE PO) Take 2,000 mg by mouth daily.  . zoledronic acid (RECLAST) 5 MG/100ML SOLN injection Inject 100 mLs (5 mg total) into the vein once.    Review of Systems  Constitutional: Negative for chills, fatigue and fever.  Respiratory: Negative for cough, chest tightness, shortness of breath and wheezing.    Cardiovascular: Negative for chest pain, palpitations and leg swelling.  Musculoskeletal: Positive for arthralgias, gait problem (when hip/knee bothering her) and joint swelling (feels like left knee isi swollen).    Objective:  BP 136/80 (BP Location: Left Arm, Patient Position: Sitting, Cuff Size: Normal)   Pulse 72   Temp 98.2 F (36.8 C) (Oral)   Ht 5' 1.5" (1.562 m)   Wt 150 lb 8 oz (68.3 kg)   BMI 27.98 kg/m   Weight: 150 lb 8 oz (68.3 kg)   BP Readings from Last 3 Encounters:  03/01/20 136/80  01/05/20 128/84  12/17/19 (!) 188/84   Wt Readings from Last 3 Encounters:  03/01/20 150 lb 8 oz (68.3 kg)  01/05/20 148 lb 4.8 oz (67.3 kg)  12/17/19 151 lb (68.5 kg)    Physical Exam Constitutional:      General: She is not in acute distress.    Appearance: She is well-developed.  Cardiovascular:     Rate and Rhythm: Normal rate and regular rhythm.     Heart sounds: Normal heart sounds. No murmur heard. No friction rub.  Pulmonary:     Effort: Pulmonary effort is normal. No respiratory distress.     Breath sounds: Normal breath sounds. No wheezing or rales.  Musculoskeletal:     Right lower leg: No edema.     Left lower leg: No edema.     Comments: Few degree decrease in flexion left  knee.  There is some medial joint line tenderness to palpation of the left knee.  Mild crepitance noted on exam.  No obvious fluid/effusion.  No knee stability noted.  Left greater trochanteric tenderness to palpation left IT band tenderness to palpation.  No significant discomfort or limitation with internal or external rotation of the hip.  SI tenderness more significant on the left side.  No significant restriction with back mobility.  Neurological:     Mental Status: She is alert and oriented to person, place, and time.  Psychiatric:        Behavior: Behavior normal.    Trochanteric bursa injection After patient verbal consent was obtained, left hip was cleansed with ChloraPrep and  combination of 4 cc lidocaine with 1 cc Kenalog was injected into trochanteric bursa/area of most tenderness.  Medication was fanned out.  Patient tolerated injection well.  Dressed with sterile bandage.  Patient had some immediate relief of pain in this area.  Assessment/Plan  1. Trochanteric bursitis of left hip Injection given today.  I do think this will be helpful for the patient, especially in conjunction with physical therapy, which she has elected to start.  Order placed for this today.  Let me know if not improving or if any worsening of discomfort.  I did encourage her to ice her hip in the next 24 to 48 hours as the steroid is kicking in, since this may be the time to have discomfort at home. - Ambulatory referral to Physical Therapy - Large Joint Injection/Arthrocentesis; Future - triamcinolone acetonide (KENALOG-40) injection 40 mg  2. Left knee pain, unspecified chronicity Left knee pain is new.  Would like to get baseline x-ray today. - Ambulatory referral to Physical Therapy - DG Knee Complete 4 Views Left; Future  3. It band syndrome, left I do think that physical therapy will help relieve this discomfort. - Ambulatory referral to Physical Therapy    Return in about 3 months (around 05/29/2020) for Chronic condition visit (has visit already set up for June).    Micheline Rough, MD

## 2020-03-09 ENCOUNTER — Ambulatory Visit (HOSPITAL_COMMUNITY): Payer: Medicare HMO | Attending: Family Medicine | Admitting: Physical Therapy

## 2020-03-09 ENCOUNTER — Encounter (HOSPITAL_COMMUNITY): Payer: Self-pay | Admitting: Physical Therapy

## 2020-03-09 ENCOUNTER — Other Ambulatory Visit: Payer: Self-pay

## 2020-03-09 DIAGNOSIS — M25552 Pain in left hip: Secondary | ICD-10-CM | POA: Insufficient documentation

## 2020-03-09 DIAGNOSIS — M6281 Muscle weakness (generalized): Secondary | ICD-10-CM | POA: Diagnosis not present

## 2020-03-09 DIAGNOSIS — R2689 Other abnormalities of gait and mobility: Secondary | ICD-10-CM | POA: Diagnosis not present

## 2020-03-09 DIAGNOSIS — R29898 Other symptoms and signs involving the musculoskeletal system: Secondary | ICD-10-CM | POA: Diagnosis not present

## 2020-03-09 NOTE — Patient Instructions (Signed)
Access Code: [email or web address] URL: [email or web address] Date: 03/09/2020 Prepared by: Mitzi Hansen Kayne Yuhas  Exercises Supine Piriformis Stretch with Leg Straight - 2 x daily - 7 x weekly - 3 reps - 20-30 second holds hold

## 2020-03-09 NOTE — Therapy (Signed)
Dawn Reyes, [place], [address] Phone: [phone]   Fax:  [phone]  Physical Therapy Evaluation  Patient Details  Name: Dawn Reyes MRN: [id number] Date of Birth: [birth year] Referring Provider (PT): Micheline Rough MD   Encounter Date: 03/09/2020   PT End of Session - 03/09/20 0918    Visit Number 1    Number of Visits 12    Date for PT Re-Evaluation 04/20/20    Authorization Type [employer] (no VL, auth requiried)    Authorization Time Period 12 visits requested - check auth    Progress Note Due on Visit 10    PT Start Time 0834    PT Stop Time 0914    PT Time Calculation (min) 40 min    Activity Tolerance Patient tolerated treatment well    Behavior During Therapy [facility] for tasks assessed/performed           Past Medical History:  Diagnosis Date  . Anxiety   . Benign hypertension   . 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 Candidiasis of mouth   . 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 Fibromyalgia   . GERD (gastroesophageal reflux disease)   . Hematochezia 02/13/2012  . Left sided abdominal pain 02/13/2012  . Osteoporosis, unspecified 07/30/2012    Past Surgical History:  Procedure Laterality Date  . ABDOMINAL HYSTERECTOMY     no concern for cancer; menorrhagia  . BREAST EXCISIONAL BIOPSY Right 11/14/2002   Radial Scar  . FOOT SURGERY Bilateral    mortons neuroma removal  . TONSILLECTOMY      There were no vitals filed for this visit.    Subjective Assessment - 03/09/20 0834    Subjective Patient is a 70 y.o. female who presents to physical therapy with c/o L hip pain and L knee pain. Patient states she is having pain in her left hip and leg down to her calf. She feels like it radiates from her hip. Symptoms are worse with walking, household chores. She had a cortisone shot which helped for a short while. She has taken naproxen but does not like to take a  lot. She has had this on and off for about 10 years with insidious onset. Her main goal is to be painless.    Limitations Walking;Sitting;House hold activities    How long can you walk comfortably? unable    Patient Stated Goals to be painless    Currently in Pain? Yes    Pain Score 5     Pain Location Hip    Pain Orientation Left    Pain Descriptors / Indicators Burning;Tightness    Pain Type Chronic pain    Pain Onset More than a month ago    Pain Frequency Constant              [facility] PT Assessment - 03/09/20 0001      Assessment   Medical Diagnosis Trochanteric bursitis of L hip, L knee pain, IT band syndrome    Referring Provider (PT) Micheline Rough MD    Onset Date/Surgical Date 03/10/10    Next MD Visit June 2022    Prior Therapy not sure      Precautions   Precautions None      Restrictions   Weight Bearing Restrictions No      Balance Screen   Has the patient fallen in the past 6 months No    Has the  patient had a decrease in activity level because of a fear of falling?  No    Is the patient reluctant to leave their home because of a fear of falling?  No      Prior Function   Level of Independence Independent    Vocation Retired    Leisure Walking      Cognition   Overall Cognitive Status Within Functional Limits for tasks assessed      Observation/Other Assessments   Observations Ambulates without AD    Focus on Therapeutic Outcomes (FOTO)  hip 61% function, knee 52% function      ROM / Strength   AROM / PROM / Strength AROM;Strength      AROM   Overall AROM  Within functional limits for tasks performed      Strength   Strength Assessment Site Hip;Knee;Ankle    Right/Left Hip Right;Left    Right Hip Flexion 4/5    Right Hip Extension 4-/5    Right Hip ABduction 4/5    Left Hip Flexion 4+/5    Left Hip Extension 4-/5    Left Hip ABduction 4/5    Right/Left Knee Right;Left    Right Knee Flexion 5/5    Right Knee Extension 5/5    Left Knee  Flexion 5/5    Left Knee Extension 5/5    Right/Left Ankle Right;Left    Right Ankle Dorsiflexion 5/5    Left Ankle Dorsiflexion 5/5      Palpation   Palpation comment TTP L glutes, piriformis, hamstrings, calf, no tenderness R LE      Transfers   Five time sit to stand comments  8.18 seconds without UE use      Ambulation/Gait   Ambulation/Gait Yes    Ambulation/Gait Assistance 7: Independent    Ambulation Distance (Feet) 530 Feet    Assistive device None    Ambulation Surface Level;Indoor    Stairs Yes    Stair Management Technique Alternating pattern    Gait Comments 2 MWT, hip pain throughout; decreased strength with stairs relying on momentum with ascending, decreased eccentric control with decending bilaterally, use of hand rails                      Objective measurements completed on examination: See above findings.       [facility] Adult PT Treatment/Exercise - 03/09/20 0001      Exercises   Exercises Knee/Hip      Knee/Hip Exercises: Stretches   Other Knee/Hip Stretches SKTC/ Piriformis stretch 3x 20 second holds                  PT Education - 03/09/20 0834    Education Details Patient educated on exam findings, POC, scope of PT, HEP    Person(s) Educated Patient    Methods Explanation;Demonstration;Handout    Comprehension Verbalized understanding;Returned demonstration            PT Short Term Goals - 03/09/20 0928      PT SHORT TERM GOAL #1   Title Patient will be independent with HEP in order to improve functional outcomes.    Time 3    Period Weeks    Status New    Target Date 03/30/20      PT SHORT TERM GOAL #2   Title Patient will report at least 25% improvement in symptoms for improved quality of life.    Time 3    Period Weeks  Status New    Target Date 03/30/20             PT Long Term Goals - 03/09/20 0929      PT LONG TERM GOAL #1   Title Patient will report at least 75% improvement in symptoms for  improved quality of life.    Time 6    Period Weeks    Status New    Target Date 04/06/20      PT LONG TERM GOAL #2   Title Patient will improve FOTO score by at least 10 points for both hip and knee in order to indicate improved tolerance to activity.    Time 6    Period Weeks    Status New      PT LONG TERM GOAL #3   Title Patient will be able to ambulate for at least 15 minutes with pain no greater than 2/10 in order to demonstrate improved ability to ambulate in the community.    Time 6    Period Weeks    Status New    Target Date 04/20/20      PT LONG TERM GOAL #4   Title Patient will demonstrate grade of 5/5 MMT grade in all tested musculature as evidence of improved strength to assist with stair ambulation and gait.    Time 6    Period Weeks    Status New    Target Date 04/20/20                  Plan - 03/09/20 0086    Clinical Impression Statement Patient is a 70 y.o. female who presents to physical therapy with c/o L hip pain and L knee pain. She presents with pain limited deficits in L hip strength, endurance, gait, hyperactive and tender musculature, and functional mobility with ADL. She is having to modify and restrict ADL as indicated by FOTO score as well as subjective information and objective measures which is affecting overall participation. Patient will benefit from skilled physical therapy in order to improve function and reduce impairment.    Personal Factors and Comorbidities Comorbidity 3+;Age;Fitness;Behavior Pattern;Past/Current Experience;Time since onset of injury/illness/exacerbation    Comorbidities Arthritis, Back pain, Osteopenia, chronic hip pain    Examination-Activity Limitations Transfers;Locomotion Level;Carry;Lift;Stand;Stairs;Squat;Sit    Examination-Participation Restrictions Meal Prep;Church;Cleaning;Occupation;Community Activity;Volunteer;Yard Work;Shop;Laundry    Stability/Clinical Decision Making Stable/Uncomplicated    Clinical  Decision Making Low    Rehab Potential Good    PT Frequency 2x / week    PT Duration 6 weeks    PT Treatment/Interventions ADLs/Self Care Home Management;Aquatic Therapy;Cryotherapy;Electrical Stimulation;Iontophoresis 4mg /ml Dexamethasone;Moist Heat;Ultrasound;Traction;DME Instruction;Gait training;Stair training;Functional mobility training;Therapeutic exercise;Therapeutic activities;Balance training;Neuromuscular re-education;Patient/family education;Orthotic Fit/Training;Manual techniques;Manual lymph drainage;Dry needling;Energy conservation;Splinting;Taping;Spinal Manipulations;Joint Manipulations    PT Next Visit Plan f/u with HEP, Begin hip strengthening for abduction/extension, begin with table exericses and progress to standing functional strengtheing for legs/hips as able with STS, stairs, etc. possibly add core strengtheing and also manual for pain    PT Home Exercise Plan 3/1 piriformis stretch    Consulted and Agree with Plan of Care Patient           Patient will benefit from skilled therapeutic intervention in order to improve the following deficits and impairments:  Abnormal gait,Decreased endurance,Increased muscle spasms,Pain,Decreased activity tolerance,Decreased strength,Decreased mobility  Visit Diagnosis: Pain in left hip  Muscle weakness (generalized)  Other abnormalities of gait and mobility  Other symptoms and signs involving the musculoskeletal system     Problem List Patient Active  Problem List   Diagnosis Date Noted  . Dyspnea 05/03/2016  . 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 Osteoporosis 07/30/2012  . Essential hypertension, benign 07/16/2012  . Neuralgia 07/16/2012    9:32 AM, 03/09/20 Mearl Latin PT, DPT Physical Therapist at [place], [place], [address] Phone: [phone]   Fax:  [phone]  Name: Dawn Reyes MRN:  [id number] Date of Birth: January 27, 1950

## 2020-03-10 ENCOUNTER — Ambulatory Visit (HOSPITAL_COMMUNITY): Payer: Medicare HMO

## 2020-03-10 DIAGNOSIS — R2689 Other abnormalities of gait and mobility: Secondary | ICD-10-CM

## 2020-03-10 DIAGNOSIS — R29898 Other symptoms and signs involving the musculoskeletal system: Secondary | ICD-10-CM

## 2020-03-10 DIAGNOSIS — M6281 Muscle weakness (generalized): Secondary | ICD-10-CM | POA: Diagnosis not present

## 2020-03-10 DIAGNOSIS — M25552 Pain in left hip: Secondary | ICD-10-CM

## 2020-03-10 NOTE — Therapy (Signed)
[place], [place], [address] Phone: [phone]   Fax:  [phone]  Physical Therapy Treatment  Patient Details  Name: Dawn Reyes MRN: [id number] Date of Birth: 12-23-1950 Referring Provider (PT): Micheline Rough MD   Encounter Date: 03/10/2020   PT End of Session - 03/10/20 1349    Visit Number 2    Number of Visits 12    Date for PT Re-Evaluation 04/20/20    Authorization Type [employer] (no VL, auth requiried)    Authorization Time Period 12 visits requested - check auth    Progress Note Due on Visit 10    PT Start Time 1300    PT Stop Time 1345    PT Time Calculation (min) 45 min    Activity Tolerance Patient tolerated treatment well    Behavior During Therapy [facility] for tasks assessed/performed           Past Medical History:  Diagnosis Date  . Anxiety   . Benign hypertension   . 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 Candidiasis of mouth   . 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 Fibromyalgia   . GERD (gastroesophageal reflux disease)   . Hematochezia 02/13/2012  . Left sided abdominal pain 02/13/2012  . Osteoporosis, unspecified 07/30/2012    Past Surgical History:  Procedure Laterality Date  . ABDOMINAL HYSTERECTOMY     no concern for cancer; menorrhagia  . BREAST EXCISIONAL BIOPSY Right 11/14/2002   Radial Scar  . FOOT SURGERY Bilateral    mortons neuroma removal  . TONSILLECTOMY      There were no vitals filed for this visit.   Subjective Assessment - 03/10/20 1306    Subjective Continued discomfort in left lateral hip and along left lateral thigh/knee.  Denies buckling or catching and notes left hip pain when sidelying and with single leg stance    Limitations Walking;Sitting;House hold activities    How long can you walk comfortably? unable    Patient Stated Goals to be painless    Currently in Pain? Yes    Pain Score 5     Pain Location Hip     Pain Onset More than a month ago              [facility] PT Assessment - 03/10/20 0001      AROM   AROM Assessment Site Hip    Right/Left Hip Left    Left Hip External Rotation  25    Left Hip Internal Rotation  55                         [facility] Adult PT Treatment/Exercise - 03/10/20 0001      Knee/Hip Exercises: Stretches   Piriformis Stretch Left;4 reps;60 seconds    Piriformis Stretch Limitations supine and sitting    Other Knee/Hip Stretches SKTC/ Piriformis stretch 3x 20 second holds      Knee/Hip Exercises: Standing   SLS 3x30 sec with onset of left lateral hip discomfort    Other Standing Knee Exercises sidestepping with green t-loop x 2 min      Knee/Hip Exercises: Supine   Other Supine Knee/Hip Exercises supine hip flexion with green t-loop 2x10                  PT Education - 03/10/20 1345    Education Details education on anatomical alignment and biomechanics of  hip joint and muscular anatomy as it pertains to femoral anteversion and gait mechanics    Person(s) Educated Patient    Methods Explanation;Demonstration    Comprehension Verbalized understanding            PT Short Term Goals - 03/09/20 8588      PT SHORT TERM GOAL #1   Title Patient will be independent with HEP in order to improve functional outcomes.    Time 3    Period Weeks    Status New    Target Date 03/30/20      PT SHORT TERM GOAL #2   Title Patient will report at least 25% improvement in symptoms for improved quality of life.    Time 3    Period Weeks    Status New    Target Date 03/30/20             PT Long Term Goals - 03/09/20 0929      PT LONG TERM GOAL #1   Title Patient will report at least 75% improvement in symptoms for improved quality of life.    Time 6    Period Weeks    Status New    Target Date 04/06/20      PT LONG TERM GOAL #2   Title Patient will improve FOTO score by at least 10 points for both hip and knee in order to indicate  improved tolerance to activity.    Time 6    Period Weeks    Status New      PT LONG TERM GOAL #3   Title Patient will be able to ambulate for at least 15 minutes with pain no greater than 2/10 in order to demonstrate improved ability to ambulate in the community.    Time 6    Period Weeks    Status New    Target Date 04/20/20      PT LONG TERM GOAL #4   Title Patient will demonstrate grade of 5/5 MMT grade in all tested musculature as evidence of improved strength to assist with stair ambulation and gait.    Time 6    Period Weeks    Status New    Target Date 04/20/20                 Plan - 03/10/20 1349    Clinical Impression Statement exhibits excessive femoral internal rotation and limited external rotation.  Continued treatment indicated to improve hip ROM and strength to minimize faulty boimechanics contributing to hip bursitis    Personal Factors and Comorbidities Comorbidity 3+;Age;Fitness;Behavior Pattern;Past/Current Experience;Time since onset of injury/illness/exacerbation    Comorbidities Arthritis, Back pain, Osteopenia, chronic hip pain    Examination-Activity Limitations Transfers;Locomotion Level;Carry;Lift;Stand;Stairs;Squat;Sit    Examination-Participation Restrictions Meal Prep;Church;Cleaning;Occupation;Community Activity;Volunteer;Yard Work;Shop;Laundry    Stability/Clinical Decision Making Stable/Uncomplicated    Rehab Potential Good    PT Frequency 2x / week    PT Duration 6 weeks    PT Treatment/Interventions ADLs/Self Care Home Management;Aquatic Therapy;Cryotherapy;Electrical Stimulation;Iontophoresis 4mg /ml Dexamethasone;Moist Heat;Ultrasound;Traction;DME Instruction;Gait training;Stair training;Functional mobility training;Therapeutic exercise;Therapeutic activities;Balance training;Neuromuscular re-education;Patient/family education;Orthotic Fit/Training;Manual techniques;Manual lymph drainage;Dry needling;Energy conservation;Splinting;Taping;Spinal  Manipulations;Joint Manipulations    PT Next Visit Plan f/u with HEP, Begin hip strengthening for abduction/extension, begin with table exericses and progress to standing functional strengtheing for legs/hips as able with STS, stairs, etc. possibly add core strengtheing and also manual for pain    PT Home Exercise Plan 3/1 piriformis stretch. hip flexion with t-loop, sidestepping with t-loop, piriformis stretch  Consulted and Agree with Plan of Care Patient           Patient will benefit from skilled therapeutic intervention in order to improve the following deficits and impairments:  Abnormal gait,Decreased endurance,Increased muscle spasms,Pain,Decreased activity tolerance,Decreased strength,Decreased mobility  Visit Diagnosis: Pain in left hip  Muscle weakness (generalized)  Other abnormalities of gait and mobility  Other symptoms and signs involving the musculoskeletal system     Problem List Patient Active Problem List   Diagnosis Date Noted  . Dyspnea 05/03/2016  . 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 Osteoporosis 07/30/2012  . Essential hypertension, benign 07/16/2012  . Neuralgia 07/16/2012   1:51 PM, 03/10/20 M. Sherlyn Lees, PT, DPT Physical Therapist- [place] Office Number: [phone]  [place] [address] [place], [place], [address] Phone: [phone]   Fax:  [phone]  Name: Dawn Reyes MRN: [id number] Date of Birth: 1950/03/25

## 2020-03-10 NOTE — Patient Instructions (Signed)
Access Code: [email or web address] URL: [email or web address] Date: 03/10/2020 Prepared by: Sherlyn Lees  Exercises Supine Hip Flexion with Resistance Loop - 1 x daily - 7 x weekly - 3 sets - 10 reps - 2 sec hold Supine Figure 4 Piriformis Stretch - 1 x daily - 7 x weekly - 3 sets - 5 reps - 15-30 sec hold Seated Piriformis Stretch - 1 x daily - 7 x weekly - 3 sets - 5 reps - 15-30 sec hold Side Stepping with Resistance at Thighs - 1 x daily - 7 x weekly

## 2020-03-16 ENCOUNTER — Ambulatory Visit (HOSPITAL_COMMUNITY): Payer: Medicare HMO | Admitting: Physical Therapy

## 2020-03-16 ENCOUNTER — Other Ambulatory Visit: Payer: Self-pay

## 2020-03-16 DIAGNOSIS — R29898 Other symptoms and signs involving the musculoskeletal system: Secondary | ICD-10-CM

## 2020-03-16 DIAGNOSIS — R2689 Other abnormalities of gait and mobility: Secondary | ICD-10-CM | POA: Diagnosis not present

## 2020-03-16 DIAGNOSIS — M6281 Muscle weakness (generalized): Secondary | ICD-10-CM | POA: Diagnosis not present

## 2020-03-16 DIAGNOSIS — M25552 Pain in left hip: Secondary | ICD-10-CM | POA: Diagnosis not present

## 2020-03-16 NOTE — Patient Instructions (Addendum)
Piriformis Stretch, Sitting    Sit, one ankle on opposite knee, same-side hand on crossed knee. Push down on knee, keeping spine straight. Lean torso forward, with flat back, until tension is felt in hamstrings and gluteals of crossed-leg side. Hold _30__ seconds.  Repeat _4__ times per session. Do _2__ sessions per day.   Bridge    Lie back, legs bent. Inhale, pressing hips up. Keeping ribs in, lengthen lower back. Exhale, rolling down along spine from top. Repeat __10__ times. Do __2__ sessions per day.   Straight Leg Raise    Tighten stomach and slowly raise locked right leg ____ inches from floor. Repeat _10___ times per set. Do _2___ sets per session. Do __2__ sessions per day.   Hamstring Stretch    With other leg bent, foot flat, grasp right leg and slowly try to straighten knee. Hold __30__ seconds. Repeat __3__ times. Do _2___ sessions per day.        HIP / KNEE: Extension - Prone    Squeeze glutes. Raise leg up. Keep knee straight. _10__ reps per set, __2_ sets per day, _7__ days per week    Abduction: Side Leg Lift (Eccentric) - Side-Lying    Lie on side. Lift top leg slightly higher than shoulder level. Keep top leg straight with body, toes pointing forward. Slowly lower for 3-5 seconds. _10__ reps per set, __2_ sets per day, _7__ days per week.

## 2020-03-16 NOTE — Therapy (Signed)
[place], [place], [address] Phone: [phone]   Fax:  [phone]  Physical Therapy Treatment  Patient Details  Name: Dawn Reyes MRN: [id number] Date of Birth: 1951/01/01 Referring Provider (PT): Micheline Rough MD   Encounter Date: 03/16/2020   PT End of Session - 03/16/20 1306    Visit Number 3    Number of Visits 12    Date for PT Re-Evaluation 04/20/20    Authorization Type [employer] (no VL, auth requiried)    Authorization Time Period 12 visits requested - check auth    Progress Note Due on Visit 10    PT Start Time 0925    PT Stop Time 1005    PT Time Calculation (min) 40 min    Activity Tolerance Patient tolerated treatment well    Behavior During Therapy [facility] for tasks assessed/performed           Past Medical History:  Diagnosis Date  . Anxiety   . Benign hypertension   . 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 Candidiasis of mouth   . 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 Fibromyalgia   . GERD (gastroesophageal reflux disease)   . Hematochezia 02/13/2012  . Left sided abdominal pain 02/13/2012  . Osteoporosis, unspecified 07/30/2012    Past Surgical History:  Procedure Laterality Date  . ABDOMINAL HYSTERECTOMY     no concern for cancer; menorrhagia  . BREAST EXCISIONAL BIOPSY Right 11/14/2002   Radial Scar  . FOOT SURGERY Bilateral    mortons neuroma removal  . TONSILLECTOMY      There were no vitals filed for this visit.   Subjective Assessment - 03/16/20 0928    Subjective pt states her pain is a 3/10 unless she's sitting then it increases to 7/10.    Currently in Pain? Yes    Pain Score 3     Pain Location Hip    Pain Orientation Left    Pain Descriptors / Indicators Aching    Pain Type Chronic pain                             [facility] Adult PT Treatment/Exercise - 03/16/20 0001      Knee/Hip Exercises: Stretches    Active Hamstring Stretch Both;3 reps;30 seconds    Piriformis Stretch Left;4 reps;60 seconds    Piriformis Stretch Limitations supine and sitting    Other Knee/Hip Stretches SKTC/ Piriformis stretch 3x 20 second holds      Knee/Hip Exercises: Seated   Sit to [employer] 5 reps      Knee/Hip Exercises: Supine   Bridges 2 sets;10 reps    Straight Leg Raises 10 reps      Knee/Hip Exercises: Sidelying   Hip ABduction 10 reps                    PT Short Term Goals - 03/09/20 0034      PT SHORT TERM GOAL #1   Title Patient will be independent with HEP in order to improve functional outcomes.    Time 3    Period Weeks    Status New    Target Date 03/30/20      PT SHORT TERM GOAL #2   Title Patient will report at least 25% improvement in symptoms for improved quality of life.    Time 3    Period  Weeks    Status New    Target Date 03/30/20             PT Long Term Goals - 03/09/20 0929      PT LONG TERM GOAL #1   Title Patient will report at least 75% improvement in symptoms for improved quality of life.    Time 6    Period Weeks    Status New    Target Date 04/06/20      PT LONG TERM GOAL #2   Title Patient will improve FOTO score by at least 10 points for both hip and knee in order to indicate improved tolerance to activity.    Time 6    Period Weeks    Status New      PT LONG TERM GOAL #3   Title Patient will be able to ambulate for at least 15 minutes with pain no greater than 2/10 in order to demonstrate improved ability to ambulate in the community.    Time 6    Period Weeks    Status New    Target Date 04/20/20      PT LONG TERM GOAL #4   Title Patient will demonstrate grade of 5/5 MMT grade in all tested musculature as evidence of improved strength to assist with stair ambulation and gait.    Time 6    Period Weeks    Status New    Target Date 04/20/20                 Plan - 03/16/20 1306    Clinical Impression Statement Sitting worse  than standing, exercises are going well with squat/tband being most challenging (she Is doing these at home).  Instructed with seated piriformis stretch and progressed mat strengthening exercises.  Noted weakness and instability with all added exercises. Pt with hamstring cramping while completing bridge so additionally added hamstring stretch to POC.   Cues to stabilizie/use core to help control movements.   Pt given printouts of these to add to HEP    Personal Factors and Comorbidities Comorbidity 3+;Age;Fitness;Behavior Pattern;Past/Current Experience;Time since onset of injury/illness/exacerbation    Comorbidities Arthritis, Back pain, Osteopenia, chronic hip pain    Examination-Activity Limitations Transfers;Locomotion Level;Carry;Lift;Stand;Stairs;Squat;Sit    Examination-Participation Restrictions Meal Prep;Church;Cleaning;Occupation;Community Activity;Volunteer;Yard Work;Shop;Laundry    Stability/Clinical Decision Making Stable/Uncomplicated    Rehab Potential Good    PT Frequency 2x / week    PT Duration 6 weeks    PT Treatment/Interventions ADLs/Self Care Home Management;Aquatic Therapy;Cryotherapy;Electrical Stimulation;Iontophoresis 4mg /ml Dexamethasone;Moist Heat;Ultrasound;Traction;DME Instruction;Gait training;Stair training;Functional mobility training;Therapeutic exercise;Therapeutic activities;Balance training;Neuromuscular re-education;Patient/family education;Orthotic Fit/Training;Manual techniques;Manual lymph drainage;Dry needling;Energy conservation;Splinting;Taping;Spinal Manipulations;Joint Manipulations    PT Next Visit Plan Progress hip strengthening for abduction/extension progressing to standing functional strengtheing, i.e. STS, stairs.    PT Home Exercise Plan 3/1 piriformis stretch. hip flexion with t-loop, sidestepping with t-loop, piriformis stretch  3/8: hamstring stretch    Consulted and Agree with Plan of Care Patient           Patient will benefit from skilled  therapeutic intervention in order to improve the following deficits and impairments:  Abnormal gait,Decreased endurance,Increased muscle spasms,Pain,Decreased activity tolerance,Decreased strength,Decreased mobility  Visit Diagnosis: Pain in left hip  Muscle weakness (generalized)  Other abnormalities of gait and mobility  Other symptoms and signs involving the musculoskeletal system     Problem List Patient Active Problem List   Diagnosis Date Noted  . Dyspnea 05/03/2016  . 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 Osteoporosis 07/30/2012  .  Essential hypertension, benign 07/16/2012  . Neuralgia 07/16/2012   Teena Irani, PTA/CLT [phone]  Teena Irani 03/16/2020, 1:15 PM  [place] [address] [place], [place], [address] Phone: [phone]   Fax:  [phone]  Name: LAVORA BRISBON MRN: [id number] Date of Birth: Oct 27, 1950

## 2020-03-17 ENCOUNTER — Ambulatory Visit (HOSPITAL_COMMUNITY): Payer: Medicare HMO | Admitting: Physical Therapy

## 2020-03-17 DIAGNOSIS — R2689 Other abnormalities of gait and mobility: Secondary | ICD-10-CM

## 2020-03-17 DIAGNOSIS — M6281 Muscle weakness (generalized): Secondary | ICD-10-CM | POA: Diagnosis not present

## 2020-03-17 DIAGNOSIS — R29898 Other symptoms and signs involving the musculoskeletal system: Secondary | ICD-10-CM | POA: Diagnosis not present

## 2020-03-17 DIAGNOSIS — M25552 Pain in left hip: Secondary | ICD-10-CM | POA: Diagnosis not present

## 2020-03-17 NOTE — Therapy (Signed)
[place], [place], [address] Phone: [phone]   Fax:  [phone]  Physical Therapy Treatment  Patient Details  Name: Dawn Reyes MRN: [id number] Date of Birth: Mar 11, 1950 Referring Provider (PT): Micheline Rough MD   Encounter Date: 03/17/2020   PT End of Session - 03/17/20 1511    Visit Number 4    Number of Visits 12    Date for PT Re-Evaluation 04/20/20    Authorization Type [employer] (no VL, auth requiried)    Authorization Time Period 12 visits 3/1-4/12    Authorization - Visit Number 4    Authorization - Number of Visits 12    Progress Note Due on Visit 10    PT Start Time 1450    PT Stop Time 1530    PT Time Calculation (min) 40 min    Activity Tolerance Patient tolerated treatment well    Behavior During Therapy [facility] for tasks assessed/performed           Past Medical History:  Diagnosis Date  . Anxiety   . Benign hypertension   . 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 Candidiasis of mouth   . 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 Fibromyalgia   . GERD (gastroesophageal reflux disease)   . Hematochezia 02/13/2012  . Left sided abdominal pain 02/13/2012  . Osteoporosis, unspecified 07/30/2012    Past Surgical History:  Procedure Laterality Date  . ABDOMINAL HYSTERECTOMY     no concern for cancer; menorrhagia  . BREAST EXCISIONAL BIOPSY Right 11/14/2002   Radial Scar  . FOOT SURGERY Bilateral    mortons neuroma removal  . TONSILLECTOMY      There were no vitals filed for this visit.   Subjective Assessment - 03/17/20 1502    Subjective pt states she is sore today from the new exericses, especially in her hips.    Currently in Pain? Yes    Pain Score 3     Pain Location Hip    Pain Orientation Left    Pain Descriptors / Indicators Aching;Radiating    Pain Type Chronic pain                             [facility] Adult PT  Treatment/Exercise - 03/17/20 0001      Knee/Hip Exercises: Stretches   Active Hamstring Stretch Both;3 reps;30 seconds    Piriformis Stretch Left;3 reps;20 seconds    Piriformis Stretch Limitations seated    Other Knee/Hip Stretches SKTC/ Piriformis stretch 3x 20 second holds    Other Knee/Hip Stretches trunk rotations 3X10" each      Knee/Hip Exercises: Standing   Forward Lunges Both;10 reps;Limitations    Forward Lunges Limitations onto 4" step no UE    Hip Abduction Both;2 sets;10 reps    Abduction Limitations Rt only    Hip Extension Both;2 sets;10 reps    Lateral Step Up Both;10 reps;Hand Hold: 1;Step Height: 4"    Forward Step Up Both;Step Height: 4";Hand Hold: 1    Forward Step Up Limitations with power up      Knee/Hip Exercises: Supine   Bridges 2 sets;10 reps    Straight Leg Raises 10 reps      Knee/Hip Exercises: Sidelying   Hip ABduction 10 reps;2 sets    Hip ABduction Limitations Lt only  PT Short Term Goals - 03/09/20 7829      PT SHORT TERM GOAL #1   Title Patient will be independent with HEP in order to improve functional outcomes.    Time 3    Period Weeks    Status New    Target Date 03/30/20      PT SHORT TERM GOAL #2   Title Patient will report at least 25% improvement in symptoms for improved quality of life.    Time 3    Period Weeks    Status New    Target Date 03/30/20             PT Long Term Goals - 03/09/20 0929      PT LONG TERM GOAL #1   Title Patient will report at least 75% improvement in symptoms for improved quality of life.    Time 6    Period Weeks    Status New    Target Date 04/06/20      PT LONG TERM GOAL #2   Title Patient will improve FOTO score by at least 10 points for both hip and knee in order to indicate improved tolerance to activity.    Time 6    Period Weeks    Status New      PT LONG TERM GOAL #3   Title Patient will be able to ambulate for at least 15 minutes with pain no  greater than 2/10 in order to demonstrate improved ability to ambulate in the community.    Time 6    Period Weeks    Status New    Target Date 04/20/20      PT LONG TERM GOAL #4   Title Patient will demonstrate grade of 5/5 MMT grade in all tested musculature as evidence of improved strength to assist with stair ambulation and gait.    Time 6    Period Weeks    Status New    Target Date 04/20/20                 Plan - 03/17/20 1534    Clinical Impression Statement Continued with established therex.  Pt with a little soreness in hip flexors but overall improved from last session.  Progressed on with standing strengthening including lunges, hip extension and step ups for bil LE's.  Able to increase to 2 sets of most exercises as well.  Pt reported overall fatigue at end of session but no increase in pain or issues.  Encouraged to continue with given HEP.    Personal Factors and Comorbidities Comorbidity 3+;Age;Fitness;Behavior Pattern;Past/Current Experience;Time since onset of injury/illness/exacerbation    Comorbidities Arthritis, Back pain, Osteopenia, chronic hip pain    Examination-Activity Limitations Transfers;Locomotion Level;Carry;Lift;Stand;Stairs;Squat;Sit    Examination-Participation Restrictions Meal Prep;Church;Cleaning;Occupation;Community Activity;Volunteer;Yard Work;Shop;Laundry    Stability/Clinical Decision Making Stable/Uncomplicated    Rehab Potential Good    PT Frequency 2x / week    PT Duration 6 weeks    PT Treatment/Interventions ADLs/Self Care Home Management;Aquatic Therapy;Cryotherapy;Electrical Stimulation;Iontophoresis 4mg /ml Dexamethasone;Moist Heat;Ultrasound;Traction;DME Instruction;Gait training;Stair training;Functional mobility training;Therapeutic exercise;Therapeutic activities;Balance training;Neuromuscular re-education;Patient/family education;Orthotic Fit/Training;Manual techniques;Manual lymph drainage;Dry needling;Energy  conservation;Splinting;Taping;Spinal Manipulations;Joint Manipulations    PT Next Visit Plan Progress hip strengthening for abduction/extension progressing to standing functional strengtheing.  Begin Single leg balance and tandem stance next session.    PT Home Exercise Plan 3/1 piriformis stretch. hip flexion with t-loop, sidestepping with t-loop, piriformis stretch  3/8: hamstring stretch    Consulted and Agree with Plan of Care Patient  Patient will benefit from skilled therapeutic intervention in order to improve the following deficits and impairments:  Abnormal gait,Decreased endurance,Increased muscle spasms,Pain,Decreased activity tolerance,Decreased strength,Decreased mobility  Visit Diagnosis: Pain in left hip  Muscle weakness (generalized)  Other abnormalities of gait and mobility  Other symptoms and signs involving the musculoskeletal system     Problem List Patient Active Problem List   Diagnosis Date Noted  . Dyspnea 05/03/2016  . 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 Osteoporosis 07/30/2012  . Essential hypertension, benign 07/16/2012  . Neuralgia 07/16/2012   Teena Irani, PTA/CLT [phone]  Teena Irani 03/17/2020, 3:35 PM  [place] [address] [place], [place], [address] Phone: [phone]   Fax:  [phone]  Name: PIERINA SCHUKNECHT MRN: [id number] Date of Birth: 1950/10/11

## 2020-03-23 ENCOUNTER — Ambulatory Visit (HOSPITAL_COMMUNITY): Payer: Medicare HMO | Admitting: Physical Therapy

## 2020-03-23 ENCOUNTER — Other Ambulatory Visit: Payer: Self-pay

## 2020-03-23 ENCOUNTER — Encounter (HOSPITAL_COMMUNITY): Payer: Self-pay | Admitting: Physical Therapy

## 2020-03-23 DIAGNOSIS — R2689 Other abnormalities of gait and mobility: Secondary | ICD-10-CM | POA: Diagnosis not present

## 2020-03-23 DIAGNOSIS — M6281 Muscle weakness (generalized): Secondary | ICD-10-CM

## 2020-03-23 DIAGNOSIS — M25552 Pain in left hip: Secondary | ICD-10-CM

## 2020-03-23 DIAGNOSIS — R29898 Other symptoms and signs involving the musculoskeletal system: Secondary | ICD-10-CM | POA: Diagnosis not present

## 2020-03-23 NOTE — Patient Instructions (Signed)
Access Code: [email or web address] URL: [email or web address] Date: 03/23/2020 Prepared by: Mitzi Hansen Pearce [place]  Exercises Single Leg Balance with Clock Reach - 1 x daily - 7 x weekly - 5 reps - 5 second hold

## 2020-03-23 NOTE — Therapy (Signed)
[place], [place], [address] Phone: [phone]   Fax:  [phone]  Physical Therapy Treatment  Patient Details  Name: Dawn Reyes MRN: [id number] Date of Birth: 11-May-1950 Referring Provider (PT): Micheline Rough MD   Encounter Date: 03/23/2020   PT End of Session - 03/23/20 0829    Visit Number 5    Number of Visits 12    Date for PT Re-Evaluation 04/20/20    Authorization Type [employer] (no VL, auth requiried)    Authorization Time Period 12 visits 3/1-4/12    Authorization - Visit Number 5    Authorization - Number of Visits 12    Progress Note Due on Visit 10    PT Start Time 0830    PT Stop Time 0910    PT Time Calculation (min) 40 min    Activity Tolerance Patient tolerated treatment well    Behavior During Therapy [facility] for tasks assessed/performed           Past Medical History:  Diagnosis Date  . Anxiety   . Benign hypertension   . 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 Candidiasis of mouth   . 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 Fibromyalgia   . GERD (gastroesophageal reflux disease)   . Hematochezia 02/13/2012  . Left sided abdominal pain 02/13/2012  . Osteoporosis, unspecified 07/30/2012    Past Surgical History:  Procedure Laterality Date  . ABDOMINAL HYSTERECTOMY     no concern for cancer; menorrhagia  . BREAST EXCISIONAL BIOPSY Right 11/14/2002   Radial Scar  . FOOT SURGERY Bilateral    mortons neuroma removal  . TONSILLECTOMY      There were no vitals filed for this visit.   Subjective Assessment - 03/23/20 0830    Subjective Patient states her hip is the same. She continues to have pain down through thigh. Her home exercises are going well.    Currently in Pain? Yes    Pain Score 3     Pain Location Hip    Pain Orientation Left    Pain Descriptors / Indicators Aching;Radiating    Pain Type Chronic pain                              [facility] Adult PT Treatment/Exercise - 03/23/20 0001      Knee/Hip Exercises: Standing   Lateral Step Up Left;1 set;10 reps;Hand Hold: 1;Step Height: 6"    Forward Step Up Both;2 sets;10 reps;Hand Hold: 0;Step Height: 6"    Forward Step Up Limitations with power up    SLS 2x 30 seconds bilateral    SLS with Vectors 5x5 second holds bilateral    Other Standing Knee Exercises tandem stance 2x 30 bilateral; lateral stepping with green band at ankles 4x 20 feet    Other Standing Knee Exercises cross over step up 2x 10 bilateral      Manual Therapy   Manual Therapy Soft tissue mobilization    Manual therapy comments completed independently from all other aspects of treatment    Soft tissue mobilization IASTM with percussion gun to glutes, L sacral boarder                  PT Education - 03/23/20 0830    Education Details HEP, exercise mechanics    Person(s) Educated Patient    Methods Explanation;Demonstration    Comprehension  Verbalized understanding;Returned demonstration            PT Short Term Goals - 03/09/20 1856      PT SHORT TERM GOAL #1   Title Patient will be independent with HEP in order to improve functional outcomes.    Time 3    Period Weeks    Status New    Target Date 03/30/20      PT SHORT TERM GOAL #2   Title Patient will report at least 25% improvement in symptoms for improved quality of life.    Time 3    Period Weeks    Status New    Target Date 03/30/20             PT Long Term Goals - 03/09/20 0929      PT LONG TERM GOAL #1   Title Patient will report at least 75% improvement in symptoms for improved quality of life.    Time 6    Period Weeks    Status New    Target Date 04/06/20      PT LONG TERM GOAL #2   Title Patient will improve FOTO score by at least 10 points for both hip and knee in order to indicate improved tolerance to activity.    Time 6    Period Weeks    Status New      PT  LONG TERM GOAL #3   Title Patient will be able to ambulate for at least 15 minutes with pain no greater than 2/10 in order to demonstrate improved ability to ambulate in the community.    Time 6    Period Weeks    Status New    Target Date 04/20/20      PT LONG TERM GOAL #4   Title Patient will demonstrate grade of 5/5 MMT grade in all tested musculature as evidence of improved strength to assist with stair ambulation and gait.    Time 6    Period Weeks    Status New    Target Date 04/20/20                 Plan - 03/23/20 0830    Clinical Impression Statement Patient able to complete step up from increased height today without UE support with contralateral knee drive for improving glute activation and balance. Patient with good eccentric control and strength with lateral step down but requires unilateral UE support for balance. Patient with min/no sway with static balance on stable surface with cueing for glute activation. Patient demonstrates good mechanics with resisted lateral stepping but fatigues quickly in hip abductor musculature. Patient tolerates manual therapy well with slight improvement in symptoms following. Patient will continue to benefit from skilled physical therapy in order to reduce impairment and improve function.    Personal Factors and Comorbidities Comorbidity 3+;Age;Fitness;Behavior Pattern;Past/Current Experience;Time since onset of injury/illness/exacerbation    Comorbidities Arthritis, Back pain, Osteopenia, chronic hip pain    Examination-Activity Limitations Transfers;Locomotion Level;Carry;Lift;Stand;Stairs;Squat;Sit    Examination-Participation Restrictions Meal Prep;Church;Cleaning;Occupation;Community Activity;Volunteer;Yard Work;Shop;Laundry    Stability/Clinical Decision Making Stable/Uncomplicated    Rehab Potential Good    PT Frequency 2x / week    PT Duration 6 weeks    PT Treatment/Interventions ADLs/Self Care Home Management;Aquatic  Therapy;Cryotherapy;Electrical Stimulation;Iontophoresis 4mg /ml Dexamethasone;Moist Heat;Ultrasound;Traction;DME Instruction;Gait training;Stair training;Functional mobility training;Therapeutic exercise;Therapeutic activities;Balance training;Neuromuscular re-education;Patient/family education;Orthotic Fit/Training;Manual techniques;Manual lymph drainage;Dry needling;Energy conservation;Splinting;Taping;Spinal Manipulations;Joint Manipulations    PT Next Visit Plan Progress hip strengthening for abduction/extension progressing to standing functional strengtheing. Possibly  continue manual to glute/sacral border    PT Home Exercise Plan 3/1 piriformis stretch. hip flexion with t-loop, sidestepping with t-loop, piriformis stretch  3/8: hamstring stretch 3/15 SLS with vectors    Consulted and Agree with Plan of Care Patient           Patient will benefit from skilled therapeutic intervention in order to improve the following deficits and impairments:  Abnormal gait,Decreased endurance,Increased muscle spasms,Pain,Decreased activity tolerance,Decreased strength,Decreased mobility  Visit Diagnosis: Pain in left hip  Muscle weakness (generalized)  Other abnormalities of gait and mobility  Other symptoms and signs involving the musculoskeletal system     Problem List Patient Active Problem List   Diagnosis Date Noted  . Dyspnea 05/03/2016  . 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 Osteoporosis 07/30/2012  . Essential hypertension, benign 07/16/2012  . Neuralgia 07/16/2012    9:13 AM, 03/23/20 Mearl Latin PT, DPT Physical Therapist at [place], [place], [address] Phone: [phone]   Fax:  [phone]  Name: Dawn Reyes MRN: [id number] Date of Birth: 06-10-1950

## 2020-03-25 ENCOUNTER — Other Ambulatory Visit: Payer: Self-pay

## 2020-03-25 ENCOUNTER — Ambulatory Visit (HOSPITAL_COMMUNITY): Payer: Medicare HMO | Admitting: Physical Therapy

## 2020-03-25 DIAGNOSIS — M25552 Pain in left hip: Secondary | ICD-10-CM

## 2020-03-25 DIAGNOSIS — R29898 Other symptoms and signs involving the musculoskeletal system: Secondary | ICD-10-CM

## 2020-03-25 DIAGNOSIS — M6281 Muscle weakness (generalized): Secondary | ICD-10-CM

## 2020-03-25 DIAGNOSIS — R2689 Other abnormalities of gait and mobility: Secondary | ICD-10-CM | POA: Diagnosis not present

## 2020-03-25 NOTE — Therapy (Signed)
[place], [place], [address] Phone: [phone]   Fax:  [phone]  Physical Therapy Treatment  Patient Details  Name: Dawn Reyes MRN: [id number] Date of Birth: 08/22/1950 Referring Provider (PT): Micheline Rough MD   Encounter Date: 03/25/2020   PT End of Session - 03/25/20 0929    Visit Number 6    Number of Visits 12    Date for PT Re-Evaluation 04/20/20    Authorization Type [employer] (no VL, auth requiried)    Authorization Time Period 12 visits 3/1-4/12    Authorization - Visit Number 6    Authorization - Number of Visits 12    Progress Note Due on Visit 10    PT Start Time 0832    PT Stop Time 0914    PT Time Calculation (min) 42 min    Activity Tolerance Patient tolerated treatment well    Behavior During Therapy [facility] for tasks assessed/performed           Past Medical History:  Diagnosis Date  . Anxiety   . Benign hypertension   . 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 Candidiasis of mouth   . 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 Fibromyalgia   . GERD (gastroesophageal reflux disease)   . Hematochezia 02/13/2012  . Left sided abdominal pain 02/13/2012  . Osteoporosis, unspecified 07/30/2012    Past Surgical History:  Procedure Laterality Date  . ABDOMINAL HYSTERECTOMY     no concern for cancer; menorrhagia  . BREAST EXCISIONAL BIOPSY Right 11/14/2002   Radial Scar  . FOOT SURGERY Bilateral    mortons neuroma removal  . TONSILLECTOMY      There were no vitals filed for this visit.   Subjective Assessment - 03/25/20 0837    Subjective Pt states she may be a tad bit better today.  Currently4/10 but she has not been sitting much today.    Currently in Pain? Yes    Pain Score 4     Pain Location Hip    Pain Orientation Left    Pain Descriptors / Indicators Aching;Radiating    Pain Type Chronic pain    Pain Onset More than a month ago                              [facility] Adult PT Treatment/Exercise - 03/25/20 0001      Knee/Hip Exercises: Standing   Hip Abduction Both;2 sets;10 reps    Hip Extension Both;2 sets;10 reps    Lateral Step Up Left;10 reps;Hand Hold: 1;Step Height: 6";2 sets    Forward Step Up Both;2 sets;10 reps;Hand Hold: 0;Step Height: 6"    Forward Step Up Limitations with power up    SLS 2x 30 seconds bilateral   on foam   SLS with Vectors 10x5 second holds bilateral    Other Standing Knee Exercises tandem stance 2x 30 bilateral; lateral stepping with green band at ankles 3x 20 feet      Manual Therapy   Manual Therapy Soft tissue mobilization    Manual therapy comments completed independently from all other aspects of treatment    Soft tissue mobilization IASTM with percussion gun to glutes, L sacral boarder                    PT Short Term Goals - 03/09/20 4431  PT SHORT TERM GOAL #1   Title Patient will be independent with HEP in order to improve functional outcomes.    Time 3    Period Weeks    Status New    Target Date 03/30/20      PT SHORT TERM GOAL #2   Title Patient will report at least 25% improvement in symptoms for improved quality of life.    Time 3    Period Weeks    Status New    Target Date 03/30/20             PT Long Term Goals - 03/09/20 0929      PT LONG TERM GOAL #1   Title Patient will report at least 75% improvement in symptoms for improved quality of life.    Time 6    Period Weeks    Status New    Target Date 04/06/20      PT LONG TERM GOAL #2   Title Patient will improve FOTO score by at least 10 points for both hip and knee in order to indicate improved tolerance to activity.    Time 6    Period Weeks    Status New      PT LONG TERM GOAL #3   Title Patient will be able to ambulate for at least 15 minutes with pain no greater than 2/10 in order to demonstrate improved ability to ambulate in the community.    Time 6     Period Weeks    Status New    Target Date 04/20/20      PT LONG TERM GOAL #4   Title Patient will demonstrate grade of 5/5 MMT grade in all tested musculature as evidence of improved strength to assist with stair ambulation and gait.    Time 6    Period Weeks    Status New    Target Date 04/20/20                 Plan - 03/25/20 4970    Clinical Impression Statement Continued with focus on improving LE strength and stability.  Pt able to complete step ups with 1 UE only and 2 full sets each side.  Progressed difficulty to SLS wit addition of foam to challenge stability.    Resumed standing hip abduction and extension and increased repetitons of vector stance without difficulty.  Cues for general form, however did well with all activities.  Continued with percussion gun at EOS as pt reports some improvement with this.    Personal Factors and Comorbidities Comorbidity 3+;Age;Fitness;Behavior Pattern;Past/Current Experience;Time since onset of injury/illness/exacerbation    Comorbidities Arthritis, Back pain, Osteopenia, chronic hip pain    Examination-Activity Limitations Transfers;Locomotion Level;Carry;Lift;Stand;Stairs;Squat;Sit    Examination-Participation Restrictions Meal Prep;Church;Cleaning;Occupation;Community Activity;Volunteer;Yard Work;Shop;Laundry    Stability/Clinical Decision Making Stable/Uncomplicated    Rehab Potential Good    PT Frequency 2x / week    PT Duration 6 weeks    PT Treatment/Interventions ADLs/Self Care Home Management;Aquatic Therapy;Cryotherapy;Electrical Stimulation;Iontophoresis 4mg /ml Dexamethasone;Moist Heat;Ultrasound;Traction;DME Instruction;Gait training;Stair training;Functional mobility training;Therapeutic exercise;Therapeutic activities;Balance training;Neuromuscular re-education;Patient/family education;Orthotic Fit/Training;Manual techniques;Manual lymph drainage;Dry needling;Energy conservation;Splinting;Taping;Spinal Manipulations;Joint  Manipulations    PT Next Visit Plan Progress hip strengthening for abduction/extension progressing to standing functional strengtheing. Possibly continue manual to glute/sacral border    PT Home Exercise Plan 3/1 piriformis stretch. hip flexion with t-loop, sidestepping with t-loop, piriformis stretch  3/8: hamstring stretch 3/15 SLS with vectors    Consulted and Agree with Plan of Care Patient  Patient will benefit from skilled therapeutic intervention in order to improve the following deficits and impairments:  Abnormal gait,Decreased endurance,Increased muscle spasms,Pain,Decreased activity tolerance,Decreased strength,Decreased mobility  Visit Diagnosis: Muscle weakness (generalized)  Pain in left hip  Other abnormalities of gait and mobility  Other symptoms and signs involving the musculoskeletal system     Problem List Patient Active Problem List   Diagnosis Date Noted  . Dyspnea 05/03/2016  . 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 Osteoporosis 07/30/2012  . Essential hypertension, benign 07/16/2012  . Neuralgia 07/16/2012   Teena Irani, PTA/CLT [phone]  Teena Irani 03/25/2020, 9:30 AM  [place], [place], [address] Phone: [phone]   Fax:  [phone]  Name: Dawn Reyes MRN: [id number] Date of Birth: 1950-01-23

## 2020-03-26 ENCOUNTER — Encounter: Payer: Self-pay | Admitting: Family Medicine

## 2020-03-30 ENCOUNTER — Other Ambulatory Visit: Payer: Self-pay

## 2020-03-30 ENCOUNTER — Ambulatory Visit (HOSPITAL_COMMUNITY): Payer: Medicare HMO

## 2020-03-30 DIAGNOSIS — M6281 Muscle weakness (generalized): Secondary | ICD-10-CM

## 2020-03-30 DIAGNOSIS — R2689 Other abnormalities of gait and mobility: Secondary | ICD-10-CM | POA: Diagnosis not present

## 2020-03-30 DIAGNOSIS — R29898 Other symptoms and signs involving the musculoskeletal system: Secondary | ICD-10-CM

## 2020-03-30 DIAGNOSIS — M25552 Pain in left hip: Secondary | ICD-10-CM

## 2020-03-30 NOTE — Therapy (Signed)
[place], [place], [address] Phone: [phone]   Fax:  [phone]  Physical Therapy Treatment  Patient Details  Name: Dawn Reyes MRN: [id number] Date of Birth: 23-Jul-1950 Referring Provider (PT): Micheline Rough MD   Encounter Date: 03/30/2020   PT End of Session - 03/30/20 0817    Visit Number 7    Number of Visits 12    Date for PT Re-Evaluation 04/20/20    Authorization Type [employer] (no VL, auth requiried)    Authorization Time Period 12 visits 3/1-4/12    Authorization - Visit Number 7    Authorization - Number of Visits 12    Progress Note Due on Visit 10    PT Start Time 0815    PT Stop Time 0900    PT Time Calculation (min) 45 min    Activity Tolerance Patient tolerated treatment well    Behavior During Therapy [facility] for tasks assessed/performed           Past Medical History:  Diagnosis Date  . Anxiety   . Benign hypertension   . 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 Candidiasis of mouth   . 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 Fibromyalgia   . GERD (gastroesophageal reflux disease)   . Hematochezia 02/13/2012  . Left sided abdominal pain 02/13/2012  . Osteoporosis, unspecified 07/30/2012    Past Surgical History:  Procedure Laterality Date  . ABDOMINAL HYSTERECTOMY     no concern for cancer; menorrhagia  . BREAST EXCISIONAL BIOPSY Right 11/14/2002   Radial Scar  . FOOT SURGERY Bilateral    mortons neuroma removal  . TONSILLECTOMY      There were no vitals filed for this visit.   Subjective Assessment - 03/30/20 0821    Subjective reports some improvement in her hip but notes proloned sitting, such as on the toilet, causes pain in buttock and extending down posterior thigh but never past the knee    Limitations Walking;Sitting;House hold activities    How long can you sit comfortably? increased pain with sitting    Patient Stated Goals to  be painless    Currently in Pain? Yes    Pain Score 7     Pain Onset More than a month ago              [facility] PT Assessment - 03/30/20 0001      Assessment   Medical Diagnosis Trochanteric bursitis of L hip, L knee pain, IT band syndrome    Referring Provider (PT) Micheline Rough MD    Onset Date/Surgical Date 03/10/10                         [facility] Adult PT Treatment/Exercise - 03/30/20 0001      Knee/Hip Exercises: Stretches   Piriformis Stretch Both;3 reps;60 seconds    Piriformis Stretch Limitations supine      Knee/Hip Exercises: Aerobic   Nustep level 3 x 6 min      Knee/Hip Exercises: Standing   Forward Lunges Both;2 sets;10 reps    Forward Lunges Limitations onto 6" step no UE    Hip Abduction Both;2 sets;10 reps    Hip Extension Both;2 sets;10 reps    Lateral Step Up Both;3 sets;10 reps;Step Height: 6"   holding 4lbs weight contralateral   Forward Step Up 3 sets;10 reps;Step Height: 6"   with emphasis on LLE  knee/hip extension   Forward Step Up Limitations with power up    Other Standing Knee Exercises weighted retro-forward walking 3 plates x 2 min. 4 plates x 2 min                  PT Education - 03/30/20 0856    Education Details pt education on HEP rationale and improving hip extenion/abduction strength    Person(s) Educated Patient    Methods Explanation;Demonstration    Comprehension Verbalized understanding            PT Short Term Goals - 03/09/20 4627      PT SHORT TERM GOAL #1   Title Patient will be independent with HEP in order to improve functional outcomes.    Time 3    Period Weeks    Status New    Target Date 03/30/20      PT SHORT TERM GOAL #2   Title Patient will report at least 25% improvement in symptoms for improved quality of life.    Time 3    Period Weeks    Status New    Target Date 03/30/20             PT Long Term Goals - 03/09/20 0929      PT LONG TERM GOAL #1   Title Patient will  report at least 75% improvement in symptoms for improved quality of life.    Time 6    Period Weeks    Status New    Target Date 04/06/20      PT LONG TERM GOAL #2   Title Patient will improve FOTO score by at least 10 points for both hip and knee in order to indicate improved tolerance to activity.    Time 6    Period Weeks    Status New      PT LONG TERM GOAL #3   Title Patient will be able to ambulate for at least 15 minutes with pain no greater than 2/10 in order to demonstrate improved ability to ambulate in the community.    Time 6    Period Weeks    Status New    Target Date 04/20/20      PT LONG TERM GOAL #4   Title Patient will demonstrate grade of 5/5 MMT grade in all tested musculature as evidence of improved strength to assist with stair ambulation and gait.    Time 6    Period Weeks    Status New    Target Date 04/20/20                 Plan - 03/30/20 0856    Clinical Impression Statement demonstrating improved glute recrutment with ther ex but requiring occasoinal cues in glute facilitation during single limb maneuvers and controlling tendency for hip adduction/internal rotation.  Continued POC indicated to improve strength and decrease hip/buttock pain.    Personal Factors and Comorbidities Comorbidity 3+;Age;Fitness;Behavior Pattern;Past/Current Experience;Time since onset of injury/illness/exacerbation    Comorbidities Arthritis, Back pain, Osteopenia, chronic hip pain    Examination-Activity Limitations Transfers;Locomotion Level;Carry;Lift;Stand;Stairs;Squat;Sit    Examination-Participation Restrictions Meal Prep;Church;Cleaning;Occupation;Community Activity;Volunteer;Yard Work;Shop;Laundry    Stability/Clinical Decision Making Stable/Uncomplicated    Rehab Potential Good    PT Frequency 2x / week    PT Duration 6 weeks    PT Treatment/Interventions ADLs/Self Care Home Management;Aquatic Therapy;Cryotherapy;Electrical Stimulation;Iontophoresis 4mg /ml  Dexamethasone;Moist Heat;Ultrasound;Traction;DME Instruction;Gait training;Stair training;Functional mobility training;Therapeutic exercise;Therapeutic activities;Balance training;Neuromuscular re-education;Patient/family education;Orthotic Fit/Training;Manual techniques;Manual lymph drainage;Dry needling;Energy conservation;Splinting;Taping;Spinal Manipulations;Joint  Manipulations    PT Next Visit Plan Progress hip strengthening for abduction/extension progressing to standing functional strengtheing. Possibly continue manual to glute/sacral border    PT Home Exercise Plan 3/1 piriformis stretch. hip flexion with t-loop, sidestepping with t-loop, piriformis stretch  3/8: hamstring stretch 3/15 SLS with vectors    Consulted and Agree with Plan of Care Patient           Patient will benefit from skilled therapeutic intervention in order to improve the following deficits and impairments:  Abnormal gait,Decreased endurance,Increased muscle spasms,Pain,Decreased activity tolerance,Decreased strength,Decreased mobility  Visit Diagnosis: Muscle weakness (generalized)  Pain in left hip  Other abnormalities of gait and mobility  Other symptoms and signs involving the musculoskeletal system     Problem List Patient Active Problem List   Diagnosis Date Noted  . Dyspnea 05/03/2016  . 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 Osteoporosis 07/30/2012  . Essential hypertension, benign 07/16/2012  . Neuralgia 07/16/2012   9:02 AM, 03/30/20 M. Sherlyn Lees, PT, DPT Physical Therapist- [place] Office Number: [phone]  [place] [address] [place], [place], [address] Phone: [phone]   Fax:  [phone]  Name: Dawn Reyes MRN: [id number] Date of Birth: March 04, 1950

## 2020-04-02 ENCOUNTER — Encounter (HOSPITAL_COMMUNITY): Payer: Self-pay

## 2020-04-02 ENCOUNTER — Other Ambulatory Visit: Payer: Self-pay

## 2020-04-02 ENCOUNTER — Ambulatory Visit (HOSPITAL_COMMUNITY): Payer: Medicare HMO

## 2020-04-02 DIAGNOSIS — M6281 Muscle weakness (generalized): Secondary | ICD-10-CM

## 2020-04-02 DIAGNOSIS — M25552 Pain in left hip: Secondary | ICD-10-CM | POA: Diagnosis not present

## 2020-04-02 DIAGNOSIS — R2689 Other abnormalities of gait and mobility: Secondary | ICD-10-CM | POA: Diagnosis not present

## 2020-04-02 DIAGNOSIS — R29898 Other symptoms and signs involving the musculoskeletal system: Secondary | ICD-10-CM | POA: Diagnosis not present

## 2020-04-02 NOTE — Therapy (Signed)
[place], [place], [address] Phone: [phone]   Fax:  [phone]  Physical Therapy Treatment  Patient Details  Name: Dawn Reyes MRN: [id number] Date of Birth: 1950-12-01 Referring Provider (PT): Micheline Rough MD   Encounter Date: 04/02/2020   PT End of Session - 04/02/20 0907    Visit Number 8    Number of Visits 12    Date for PT Re-Evaluation 04/20/20    Authorization Type [employer] (no VL, auth requiried)    Authorization Time Period 12 visits 3/1-4/12    Authorization - Visit Number 8    Authorization - Number of Visits 12    Progress Note Due on Visit 10    PT Start Time 0902    PT Stop Time 0945    PT Time Calculation (min) 43 min    Activity Tolerance Patient tolerated treatment well    Behavior During Therapy [facility] for tasks assessed/performed           Past Medical History:  Diagnosis Date  . Anxiety   . Benign hypertension   . 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 Candidiasis of mouth   . 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 Fibromyalgia   . GERD (gastroesophageal reflux disease)   . Hematochezia 02/13/2012  . Left sided abdominal pain 02/13/2012  . Osteoporosis, unspecified 07/30/2012    Past Surgical History:  Procedure Laterality Date  . ABDOMINAL HYSTERECTOMY     no concern for cancer; menorrhagia  . BREAST EXCISIONAL BIOPSY Right 11/14/2002   Radial Scar  . FOOT SURGERY Bilateral    mortons neuroma removal  . TONSILLECTOMY      There were no vitals filed for this visit.   Subjective Assessment - 04/02/20 0906    Subjective Reprots soreness after sleeping on her affected side with notable radiating pain lateral thigh to lateral knee    Limitations Walking;Sitting;House hold activities    How long can you sit comfortably? increased pain with sitting    Patient Stated Goals to be painless    Currently in Pain? No/denies    Pain  Score 0-No pain    Pain Location Hip    Pain Orientation Left    Pain Descriptors / Indicators Aching;Radiating    Pain Type Chronic pain    Pain Onset More than a month ago                             [facility] Adult PT Treatment/Exercise - 04/02/20 0001      Knee/Hip Exercises: Stretches   ITB Stretch Both;4 reps;60 seconds    Piriformis Stretch Both;4 reps;60 seconds    Piriformis Stretch Limitations supine      Knee/Hip Exercises: Aerobic   Elliptical level 3 x 5 min      Knee/Hip Exercises: Standing   Knee Flexion Strengthening;Both;3 sets;10 reps    Knee Flexion Limitations 5 lbs    Forward Lunges Both;2 sets;10 reps    Hip Abduction Both;2 sets;10 reps;Stengthening    Abduction Limitations 5 lbs    Step Down Both;2 sets;10 reps;Step Height: 2"    Gait Training lateral sidestepping x2 min 5 lbs ankle weights    Other Standing Knee Exercises weighted retro-forward walking 5 plates x 2 min. 4 plates x 2 min  PT Short Term Goals - 03/09/20 0630      PT SHORT TERM GOAL #1   Title Patient will be independent with HEP in order to improve functional outcomes.    Time 3    Period Weeks    Status New    Target Date 03/30/20      PT SHORT TERM GOAL #2   Title Patient will report at least 25% improvement in symptoms for improved quality of life.    Time 3    Period Weeks    Status New    Target Date 03/30/20             PT Long Term Goals - 03/09/20 0929      PT LONG TERM GOAL #1   Title Patient will report at least 75% improvement in symptoms for improved quality of life.    Time 6    Period Weeks    Status New    Target Date 04/06/20      PT LONG TERM GOAL #2   Title Patient will improve FOTO score by at least 10 points for both hip and knee in order to indicate improved tolerance to activity.    Time 6    Period Weeks    Status New      PT LONG TERM GOAL #3   Title Patient will be able to ambulate for at least  15 minutes with pain no greater than 2/10 in order to demonstrate improved ability to ambulate in the community.    Time 6    Period Weeks    Status New    Target Date 04/20/20      PT LONG TERM GOAL #4   Title Patient will demonstrate grade of 5/5 MMT grade in all tested musculature as evidence of improved strength to assist with stair ambulation and gait.    Time 6    Period Weeks    Status New    Target Date 04/20/20                 Plan - 04/02/20 1601    Clinical Impression Statement Notes increase in left hip soreness with isolated movements (resisted hip abduction). Pt notes continued pain in left lateral hip with sidelying and prolonged sitting.  Continued POC to improve kinematics and develop comprehensive HEP to increase glute strength    Personal Factors and Comorbidities Comorbidity 3+;Age;Fitness;Behavior Pattern;Past/Current Experience;Time since onset of injury/illness/exacerbation    Comorbidities Arthritis, Back pain, Osteopenia, chronic hip pain    Examination-Activity Limitations Transfers;Locomotion Level;Carry;Lift;Stand;Stairs;Squat;Sit    Examination-Participation Restrictions Meal Prep;Church;Cleaning;Occupation;Community Activity;Volunteer;Yard Work;Shop;Laundry    Stability/Clinical Decision Making Stable/Uncomplicated    Rehab Potential Good    PT Frequency 2x / week    PT Duration 6 weeks    PT Treatment/Interventions ADLs/Self Care Home Management;Aquatic Therapy;Cryotherapy;Electrical Stimulation;Iontophoresis 4mg /ml Dexamethasone;Moist Heat;Ultrasound;Traction;DME Instruction;Gait training;Stair training;Functional mobility training;Therapeutic exercise;Therapeutic activities;Balance training;Neuromuscular re-education;Patient/family education;Orthotic Fit/Training;Manual techniques;Manual lymph drainage;Dry needling;Energy conservation;Splinting;Taping;Spinal Manipulations;Joint Manipulations    PT Next Visit Plan Progress hip strengthening for  abduction/extension progressing to standing functional strengtheing. Possibly continue manual to glute/sacral border    PT Home Exercise Plan 3/1 piriformis stretch. hip flexion with t-loop, sidestepping with t-loop, piriformis stretch  3/8: hamstring stretch 3/15 SLS with vectors    Consulted and Agree with Plan of Care Patient           Patient will benefit from skilled therapeutic intervention in order to improve the following deficits and impairments:  Abnormal gait,Decreased endurance,Increased muscle  spasms,Pain,Decreased activity tolerance,Decreased strength,Decreased mobility  Visit Diagnosis: Muscle weakness (generalized)  Pain in left hip  Other abnormalities of gait and mobility  Other symptoms and signs involving the musculoskeletal system     Problem List Patient Active Problem List   Diagnosis Date Noted  . Dyspnea 05/03/2016  . 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 Osteoporosis 07/30/2012  . Essential hypertension, benign 07/16/2012  . Neuralgia 07/16/2012   9:46 AM, 04/02/20 M. Sherlyn Lees, PT, DPT Physical Therapist- [place] Office Number: [phone]  [place] [address] [place], [place], [address] Phone: [phone]   Fax:  [phone]  Name: MARRIE CHANDRA MRN: [id number] Date of Birth: 31-Oct-1950

## 2020-04-06 ENCOUNTER — Ambulatory Visit (HOSPITAL_COMMUNITY): Payer: Medicare HMO

## 2020-04-06 ENCOUNTER — Other Ambulatory Visit: Payer: Self-pay

## 2020-04-06 DIAGNOSIS — R29898 Other symptoms and signs involving the musculoskeletal system: Secondary | ICD-10-CM

## 2020-04-06 DIAGNOSIS — M6281 Muscle weakness (generalized): Secondary | ICD-10-CM

## 2020-04-06 DIAGNOSIS — R2689 Other abnormalities of gait and mobility: Secondary | ICD-10-CM | POA: Diagnosis not present

## 2020-04-06 DIAGNOSIS — M25552 Pain in left hip: Secondary | ICD-10-CM

## 2020-04-06 NOTE — Therapy (Signed)
[place], [place], [address] Phone: [phone]   Fax:  [phone]  Physical Therapy Treatment  Patient Details  Name: Dawn Reyes MRN: [id number] Date of Birth: 1950-12-27 Referring Provider (PT): Micheline Rough MD   Encounter Date: 04/06/2020   PT End of Session - 04/06/20 0819    Visit Number 9    Number of Visits 12    Date for PT Re-Evaluation 04/20/20    Authorization Type [employer] (no VL, auth requiried)    Authorization Time Period 12 visits 3/1-4/12    Authorization - Visit Number 9    Authorization - Number of Visits 12    Progress Note Due on Visit 10    PT Start Time 0815    PT Stop Time 0900    PT Time Calculation (min) 45 min    Activity Tolerance Patient tolerated treatment well    Behavior During Therapy [facility] for tasks assessed/performed           Past Medical History:  Diagnosis Date  . Anxiety   . Benign hypertension   . 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 Candidiasis of mouth   . 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 Fibromyalgia   . GERD (gastroesophageal reflux disease)   . Hematochezia 02/13/2012  . Left sided abdominal pain 02/13/2012  . Osteoporosis, unspecified 07/30/2012    Past Surgical History:  Procedure Laterality Date  . ABDOMINAL HYSTERECTOMY     no concern for cancer; menorrhagia  . BREAST EXCISIONAL BIOPSY Right 11/14/2002   Radial Scar  . FOOT SURGERY Bilateral    mortons neuroma removal  . TONSILLECTOMY      There were no vitals filed for this visit.   Subjective Assessment - 04/06/20 0817    Subjective Reports continued symptoms along left lateral hip to lateral knee primarily with sitting or prolonged standing    Limitations Walking;Sitting;House hold activities    How long can you sit comfortably? increased pain with sitting    Patient Stated Goals to be painless    Currently in Pain? No/denies    Pain  Score 0-No pain   at present with standing/activity   Pain Location Hip    Pain Orientation Left    Pain Type Chronic pain    Pain Onset More than a month ago                             [facility] Adult PT Treatment/Exercise - 04/06/20 0001      Knee/Hip Exercises: Aerobic   Elliptical level 3 x 5 min for dynamic warm-up      Knee/Hip Exercises: Standing   Knee Flexion Strengthening;Both;3 sets;10 reps    Knee Flexion Limitations 10 lbs    Forward Step Up 3 sets;10 reps;Step Height: 6"    Forward Step Up Limitations with power up    Gait Training lateral sidestepping x2 min 10 lbs ankle weights    Other Standing Knee Exercises weighted retro-forward walking 5 plates x 2 min. 4 plates x 2 min      Knee/Hip Exercises: Seated   Long Arc Quad Strengthening;Both;3 sets;10 reps    Long Arc Quad Weight 10 lbs.      Knee/Hip Exercises: Supine   Bridges 2 sets;10 reps    Bridges Limitations with PF and DF, on 4" box      Knee/Hip Exercises:  Prone   Other Prone Exercises front plank 3x15 sec                  PT Education - 04/06/20 0833    Education Details discussion on ESWT and review of literature on modality    Person(s) Educated Patient    Methods Explanation    Comprehension Verbalized understanding            PT Short Term Goals - 03/09/20 1950      PT SHORT TERM GOAL #1   Title Patient will be independent with HEP in order to improve functional outcomes.    Time 3    Period Weeks    Status New    Target Date 03/30/20      PT SHORT TERM GOAL #2   Title Patient will report at least 25% improvement in symptoms for improved quality of life.    Time 3    Period Weeks    Status New    Target Date 03/30/20             PT Long Term Goals - 03/09/20 0929      PT LONG TERM GOAL #1   Title Patient will report at least 75% improvement in symptoms for improved quality of life.    Time 6    Period Weeks    Status New    Target Date  04/06/20      PT LONG TERM GOAL #2   Title Patient will improve FOTO score by at least 10 points for both hip and knee in order to indicate improved tolerance to activity.    Time 6    Period Weeks    Status New      PT LONG TERM GOAL #3   Title Patient will be able to ambulate for at least 15 minutes with pain no greater than 2/10 in order to demonstrate improved ability to ambulate in the community.    Time 6    Period Weeks    Status New    Target Date 04/20/20      PT LONG TERM GOAL #4   Title Patient will demonstrate grade of 5/5 MMT grade in all tested musculature as evidence of improved strength to assist with stair ambulation and gait.    Time 6    Period Weeks    Status New    Target Date 04/20/20                  Patient will benefit from skilled therapeutic intervention in order to improve the following deficits and impairments:     Visit Diagnosis: Muscle weakness (generalized)  Pain in left hip  Other abnormalities of gait and mobility  Other symptoms and signs involving the musculoskeletal system     Problem List Patient Active Problem List   Diagnosis Date Noted  . Dyspnea 05/03/2016  . 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 Osteoporosis 07/30/2012  . Essential hypertension, benign 07/16/2012  . Neuralgia 07/16/2012   9:03 AM, 04/06/20 M. Sherlyn Lees, PT, DPT Physical Therapist- [place] Office Number: [phone]  [place] [address] [place], [place], [address] Phone: [phone]   Fax:  [phone]  Name: Dawn Reyes MRN: [id number] Date of Birth: [birth year]

## 2020-04-08 ENCOUNTER — Ambulatory Visit (HOSPITAL_COMMUNITY): Payer: Medicare HMO

## 2020-04-13 ENCOUNTER — Ambulatory Visit (HOSPITAL_COMMUNITY): Payer: Medicare HMO | Attending: Family Medicine | Admitting: Physical Therapy

## 2020-04-13 ENCOUNTER — Encounter (HOSPITAL_COMMUNITY): Payer: Self-pay | Admitting: Physical Therapy

## 2020-04-13 ENCOUNTER — Other Ambulatory Visit: Payer: Self-pay

## 2020-04-13 DIAGNOSIS — R29898 Other symptoms and signs involving the musculoskeletal system: Secondary | ICD-10-CM | POA: Diagnosis not present

## 2020-04-13 DIAGNOSIS — R2689 Other abnormalities of gait and mobility: Secondary | ICD-10-CM | POA: Insufficient documentation

## 2020-04-13 DIAGNOSIS — M6281 Muscle weakness (generalized): Secondary | ICD-10-CM | POA: Diagnosis not present

## 2020-04-13 DIAGNOSIS — M25552 Pain in left hip: Secondary | ICD-10-CM | POA: Diagnosis not present

## 2020-04-13 NOTE — Therapy (Signed)
[place] [address] [place], [place], [address] Phone: [phone]   Fax:  [phone]  Physical Therapy Treatment/Discharge Summary  Patient Details  Name: Dawn Reyes MRN: [id number] Date of Birth: 02/05/1950 Referring Provider (PT): Micheline Rough MD   Encounter Date: 04/13/2020  PHYSICAL THERAPY DISCHARGE SUMMARY  Visits from Start of Care: 10  Current functional level related to goals / functional outcomes: See below    Remaining deficits: See below   Education / Equipment: See below  Plan: Patient agrees to discharge.  Patient goals were partially met. Patient is being discharged due to meeting the stated rehab goals.  ?????        PT End of Session - 04/13/20 0913    Visit Number 10    Number of Visits 12    Date for PT Re-Evaluation 04/20/20    Authorization Type [employer] (no VL, auth requiried)    Authorization Time Period 12 visits 3/1-4/12    Authorization - Visit Number 10    Authorization - Number of Visits 12    Progress Note Due on Visit 10    PT Start Time 0915    PT Stop Time 0945    PT Time Calculation (min) 30 min    Activity Tolerance Patient tolerated treatment well    Behavior During Therapy WFL for tasks assessed/performed           Past Medical History:  Diagnosis Date  . Anxiety   . Benign hypertension   . 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 Candidiasis of mouth   . 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 Fibromyalgia   . GERD (gastroesophageal reflux disease)   . Hematochezia 02/13/2012  . Left sided abdominal pain 02/13/2012  . Osteoporosis, unspecified 07/30/2012    Past Surgical History:  Procedure Laterality Date  . ABDOMINAL HYSTERECTOMY     no concern for cancer; menorrhagia  . BREAST EXCISIONAL BIOPSY Right 11/14/2002   Radial Scar  . FOOT SURGERY Bilateral    mortons neuroma removal  . TONSILLECTOMY      There were no  vitals filed for this visit.   Subjective Assessment - 04/13/20 0915    Subjective Patient states her home exercises are going well. She feels like her hip has improved. Patient states 40% improvement with PT intervention. Patient remains limited by hip symptoms. She can walk about an hour without her hip bothering her. She still has hip pain with sitting.    Limitations Walking;Sitting;House hold activities    How long can you sit comfortably? increased pain with sitting    Patient Stated Goals to be painless    Currently in Pain? No/denies    Pain Onset More than a month ago              [facility] PT Assessment - 04/13/20 0001      Assessment   Medical Diagnosis Trochanteric bursitis of L hip, L knee pain, IT band syndrome    Referring Provider (PT) Micheline Rough MD    Onset Date/Surgical Date 03/10/10    Next MD Visit June 2022    Prior Therapy not sure      Precautions   Precautions None      Restrictions   Weight Bearing Restrictions No      Balance Screen   Has the patient fallen in the past 6 months No    Has the patient had a decrease  in activity level because of a fear of falling?  No    Is the patient reluctant to leave their home because of a fear of falling?  No      Prior Function   Level of Independence Independent    Vocation Retired    Leisure Walking      Cognition   Overall Cognitive Status Within Functional Limits for tasks assessed      Observation/Other Assessments   Observations Ambulates without AD    Focus on Therapeutic Outcomes (FOTO)  hip 91% function, knee 49% function      Strength   Right Hip Flexion 4+/5    Right Hip Extension 4+/5    Right Hip ABduction 4+/5    Left Hip Flexion 4+/5    Left Hip Extension 4+/5    Left Hip ABduction 4+/5                                 PT Education - 04/13/20 0914    Education Details Patient educated on HEP, exercise mechanics, progress made, returning to PT if needed,  review of HEP    Person(s) Educated Patient    Methods Explanation;Demonstration    Comprehension Verbalized understanding;Returned demonstration            PT Short Term Goals - 04/13/20 0925      PT SHORT TERM GOAL #1   Title Patient will be independent with HEP in order to improve functional outcomes.    Time 3    Period Weeks    Status Achieved    Target Date 03/30/20      PT SHORT TERM GOAL #2   Title Patient will report at least 25% improvement in symptoms for improved quality of life.    Time 3    Period Weeks    Status Achieved    Target Date 03/30/20             PT Long Term Goals - 04/13/20 0925      PT LONG TERM GOAL #1   Title Patient will report at least 75% improvement in symptoms for improved quality of life.    Time 6    Period Weeks    Status On-going      PT LONG TERM GOAL #2   Title Patient will improve FOTO score by at least 10 points for both hip and knee in order to indicate improved tolerance to activity.    Baseline met for hip, not for knee    Time 6    Period Weeks    Status Partially Met      PT LONG TERM GOAL #3   Title Patient will be able to ambulate for at least 15 minutes with pain no greater than 2/10 in order to demonstrate improved ability to ambulate in the community.    Time 6    Period Weeks    Status Achieved      PT LONG TERM GOAL #4   Title Patient will demonstrate grade of 5/5 MMT grade in all tested musculature as evidence of improved strength to assist with stair ambulation and gait.    Time 6    Period Weeks    Status Partially Met                 Plan - 04/13/20 0914    Clinical Impression Statement Patient has met 2/2 short term goals and  1/4 long term goals with 2 long term goals being partially met. Patient has met goals for being independent with HEP, improved symptoms, and ambulation ability and partially met goals for improved activity tolerance in hip and strength improvements. Patient remains  limited by continued symptoms especially with L knee, minimally decreased strength in hip, and impaired activity tolerance. Patient educated on progress and will work on HEP for further improvement in functional mobility with hopes of decreased symptoms. Patient also educated on returning to PT if needed for progression on current HEP if needed. Patient discharged from physical therapy at this time.    Personal Factors and Comorbidities Comorbidity 3+;Age;Fitness;Behavior Pattern;Past/Current Experience;Time since onset of injury/illness/exacerbation    Comorbidities Arthritis, Back pain, Osteopenia, chronic hip pain    Examination-Activity Limitations Transfers;Locomotion Level;Carry;Lift;Stand;Stairs;Squat;Sit    Examination-Participation Restrictions Meal Prep;Church;Cleaning;Occupation;Community Activity;Volunteer;Yard Work;Shop;Laundry    Stability/Clinical Decision Making Stable/Uncomplicated    Rehab Potential Good    PT Frequency --    PT Duration --    PT Treatment/Interventions ADLs/Self Care Home Management;Aquatic Therapy;Cryotherapy;Electrical Stimulation;Iontophoresis 27m/ml Dexamethasone;Moist Heat;Ultrasound;Traction;DME Instruction;Gait training;Stair training;Functional mobility training;Therapeutic exercise;Therapeutic activities;Balance training;Neuromuscular re-education;Patient/family education;Orthotic Fit/Training;Manual techniques;Manual lymph drainage;Dry needling;Energy conservation;Splinting;Taping;Spinal Manipulations;Joint Manipulations    PT Next Visit Plan n/a    PT Home Exercise Plan 3/1 piriformis stretch. hip flexion with t-loop, sidestepping with t-loop, piriformis stretch  3/8: hamstring stretch 3/15 SLS with vectors    Consulted and Agree with Plan of Care Patient           Patient will benefit from skilled therapeutic intervention in order to improve the following deficits and impairments:  Abnormal gait,Decreased endurance,Increased muscle  spasms,Pain,Decreased activity tolerance,Decreased strength,Decreased mobility  Visit Diagnosis: Muscle weakness (generalized)  Pain in left hip  Other abnormalities of gait and mobility  Other symptoms and signs involving the musculoskeletal system     Problem List Patient Active Problem List   Diagnosis Date Noted  . Dyspnea 05/03/2016  . 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 Osteoporosis 07/30/2012  . Essential hypertension, benign 07/16/2012  . Neuralgia 07/16/2012    9:55 AM, 04/13/20 AMearl LatinPT, DPT [place] [address]Phone: [phone]  Fax:  [phone] Name: Dawn SABATERMRN: [id number] of Birth: [birth year]

## 2020-04-15 ENCOUNTER — Encounter (HOSPITAL_COMMUNITY): Payer: Medicare HMO | Admitting: Physical Therapy

## 2020-04-20 ENCOUNTER — Encounter (HOSPITAL_COMMUNITY): Payer: Medicare HMO | Admitting: Physical Therapy

## 2020-04-22 ENCOUNTER — Encounter (HOSPITAL_COMMUNITY): Payer: Medicare HMO | Admitting: Physical Therapy

## 2020-05-02 ENCOUNTER — Other Ambulatory Visit: Payer: Self-pay | Admitting: Endocrinology

## 2020-05-02 DIAGNOSIS — M810 Age-related osteoporosis without current pathological fracture: Secondary | ICD-10-CM

## 2020-05-04 ENCOUNTER — Other Ambulatory Visit (INDEPENDENT_AMBULATORY_CARE_PROVIDER_SITE_OTHER): Payer: Medicare HMO

## 2020-05-04 ENCOUNTER — Other Ambulatory Visit: Payer: Self-pay

## 2020-05-04 DIAGNOSIS — M810 Age-related osteoporosis without current pathological fracture: Secondary | ICD-10-CM | POA: Diagnosis not present

## 2020-05-04 LAB — VITAMIN D 25 HYDROXY (VIT D DEFICIENCY, FRACTURES): VITD: 48.49 ng/mL (ref 30.00–100.00)

## 2020-05-04 LAB — COMPREHENSIVE METABOLIC PANEL
ALT: 14 U/L (ref 0–35)
AST: 23 U/L (ref 0–37)
Albumin: 4.2 g/dL (ref 3.5–5.2)
Alkaline Phosphatase: 41 U/L (ref 39–117)
BUN: 12 mg/dL (ref 6–23)
CO2: 31 mEq/L (ref 19–32)
Calcium: 9.3 mg/dL (ref 8.4–10.5)
Chloride: 97 mEq/L (ref 96–112)
Creatinine, Ser: 0.76 mg/dL (ref 0.40–1.20)
GFR: 79.3 mL/min (ref >60.00)
Glucose, Bld: 85 mg/dL (ref 70–99)
Potassium: 3.7 mEq/L (ref 3.5–5.1)
Sodium: 135 mEq/L (ref 135–145)
Total Bilirubin: 0.7 mg/dL (ref 0.2–1.2)
Total Protein: 6.7 g/dL (ref 6.0–8.3)

## 2020-05-07 ENCOUNTER — Other Ambulatory Visit: Payer: Self-pay

## 2020-05-07 ENCOUNTER — Ambulatory Visit: Payer: Medicare HMO | Admitting: Endocrinology

## 2020-05-07 ENCOUNTER — Encounter: Payer: Self-pay | Admitting: Endocrinology

## 2020-05-07 VITALS — BP 136/82 | HR 64 | Ht 62.0 in | Wt 145.0 lb

## 2020-05-07 DIAGNOSIS — M810 Age-related osteoporosis without current pathological fracture: Secondary | ICD-10-CM

## 2020-05-07 NOTE — Progress Notes (Signed)
Patient ID: Dawn Reyes, female   DOB: [birth year], 70 y.o.   MRN: [id number]   History of Present Illness:  OSTEOPOROSIS:   Background history includes menopause at the usual age of about 81+.   She was told by her gynecologist in 2006 that she had osteopenia on screening and bone density.  She  had a T score of -2.0 at the right right neck of femur but her spine was normal At some point the patient was given a trial of Fosamax which she took for about 2 years but stopped it because of difficulty with lower chest and upper abdominal discomfort when she would take this She also tried this again in 2014 but could not tolerate it for longer than 6 months  Recent history: In 2011 she was found to have a T score of -2.5 at the hip and  subsequently this was -2.7 at the left hip in 2014 She was given Atelvia in 2014 and she took this for 3-4 months and did not follow-up for a couple of years after that  Initial consultation she reported that she had lost only about a half an inch in height since her youth  Recently not complaining of any back pain recent history of fractures Her height is unchanged at 5 feet 2 inches  She was given Reclast on 05/19/15, 05/09/16  05/15/2017, in 06/2018 and 06/2019  She tolerated all infusions well without side effects  She again has been taking the 2000 unit doses of vitamin D Her vitamin D levels are consistently normal She takes calcium twice daily as before She does regular exercise  She is now here for regular annual follow-up  Her bone density in 2018 showed the following changes:  04/25/2016 Lumbar spine (L1-L4) Femoral neck (FN) 33% distal radius  T-score -0.6 RFN:-2.4 LFN:-2.4 n/a  Change in BMD from previous DXA test (%) Up 5.6% Up 3.5% n/a    Vitamin D levels:  Lab Results  Component Value Date   VD25OH 48.49 05/04/2020   VD25OH 47.23 04/28/2019   VD25OH 52.42 04/22/2018     Past Medical History:  Diagnosis Date  .  Anxiety   . Benign hypertension   . 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 Candidiasis of mouth   . 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 Fibromyalgia   . GERD (gastroesophageal reflux disease)   . Hematochezia 02/13/2012  . Left sided abdominal pain 02/13/2012  . Osteoporosis, unspecified 07/30/2012    Past Surgical History:  Procedure Laterality Date  . ABDOMINAL HYSTERECTOMY     no concern for cancer; menorrhagia  . BREAST EXCISIONAL BIOPSY Right 11/14/2002   Radial Scar  . FOOT SURGERY Bilateral    mortons neuroma removal  . TONSILLECTOMY      Family History  Problem Relation Age of Onset  . Hypertension Father   . Lung cancer Father   . Liver cancer Father   . Epilepsy Father   . Lung cancer Mother   . Arthritis Mother   . Hypertension Sister   . Osteoporosis Sister   . Hypertension Brother   . Arthritis Brother   . Lung cancer Brother        small cell  . Colon cancer Son 4  . Diabetes Neg Hx     Social History:  reports that she has never smoked. She has never used smokeless tobacco. She reports that she does not drink alcohol and does  not use drugs.  Allergies:  Allergies  Allergen Reactions  . Vicodin [Hydrocodone-Acetaminophen] Itching    Allergies as of 05/07/2020      Reactions   Vicodin [hydrocodone-acetaminophen] Itching      Medication List       Accurate as of May 07, 2020  8:33 AM. If you have any questions, ask your nurse or doctor.        CALCIUM PO Take by mouth.   fluticasone 50 MCG/ACT nasal spray Commonly known as: FLONASE Place 2 sprays into both nostrils daily.   gabapentin 300 MG capsule Commonly known as: NEURONTIN Take 3 capsules (900 mg total) by mouth 2 (two) times daily.   hydrochlorothiazide 12.5 MG capsule Commonly known as: Microzide Take 1 capsule (12.5 mg total) by mouth daily.   Krill Oil 300 MG Caps Take 1 capsule by mouth daily.   multivitamin tablet Take 1 tablet by  mouth daily.   potassium chloride 10 MEQ tablet Commonly known as: KLOR-CON Take 1 tablet (10 mEq total) by mouth daily.   RED YEAST RICE PO Take 2,000 mg by mouth daily.   Vitamin D3 50 MCG (2000 UT) Tabs Take 1 tablet by mouth daily.   zoledronic acid 5 MG/100ML Soln injection Commonly known as: Reclast Inject 100 mLs (5 mg total) into the vein once.        REVIEW OF SYSTEMS:        She is on HCTZ for hypertension    EXAM:  BP 136/82   Pulse 64   Ht 5\' 2"  (1.575 m)   Wt 145 lb (65.8 kg)   SpO2 99%   BMI 26.52 kg/m   Exam not indicated   ASSESSMENT:   History of osteoporosis, long-standing and asymptomatic Has had insignificant height loss previously and none since starting treatment  She has mild osteoporosis at the left neck of the femur with lowest T score -2.7 in 2014 which improved to -2.4 in 2018 She has tolerated Reclast annually, last injection in June 2021 for a total of 5 infusions  Again has not had any known fractures  Her vitamin D level is consistently normal with 2000 units of vitamin D3 Calcium level normal    PLAN:   Will recheck bone density now and further treatment to be decided, will need to at least give her 1 year drug holiday with her Reclast To continue calcium and vitamin D  Elayne Snare 05/07/2020, 8:33 AM

## 2020-05-11 ENCOUNTER — Ambulatory Visit (INDEPENDENT_AMBULATORY_CARE_PROVIDER_SITE_OTHER): Payer: Medicare HMO

## 2020-05-11 DIAGNOSIS — Z1231 Encounter for screening mammogram for malignant neoplasm of breast: Secondary | ICD-10-CM | POA: Diagnosis not present

## 2020-05-11 DIAGNOSIS — Z0000 Encounter for general adult medical examination without abnormal findings: Secondary | ICD-10-CM

## 2020-05-11 NOTE — Patient Instructions (Signed)
Dawn Reyes , Thank you for taking time to come for your Medicare Wellness Visit. I appreciate your ongoing commitment to your health goals. Please review the following plan we discussed and let me know if I can assist you in the future.   Screening recommendations/referrals: Colonoscopy: current Due 08/23/2022 Mammogram: ordered 05/11/2020 Bone Density: ordered 05/07/2020 Recommended yearly ophthalmology/optometry visit for glaucoma screening and checkup Recommended yearly dental visit for hygiene and checkup  Vaccinations: Influenza vaccine: current due fall 2022 Pneumococcal vaccine: advised to obtain  Tdap vaccine: current due 12/21/2022 Shingles vaccine: per pt obtained [facility]    Advanced directives: will provide copies  Conditions/risks identified: none  Next appointment: none   Preventive Care 65 Years and Older, Female Preventive care refers to lifestyle choices and visits with your health care provider that can promote health and wellness. What does preventive care include?  A yearly physical exam. This is also called an annual well check.  Dental exams once or twice a year.  Routine eye exams. Ask your health care provider how often you should have your eyes checked.  Personal lifestyle choices, including:  Daily care of your teeth and gums.  Regular physical activity.  Eating a healthy diet.  Avoiding tobacco and drug use.  Limiting alcohol use.  Practicing safe sex.  Taking low-dose aspirin every day.  Taking vitamin and mineral supplements as recommended by your health care provider. What happens during an annual well check? The services and screenings done by your health care provider during your annual well check will depend on your age, overall health, lifestyle risk factors, and family history of disease. Counseling  Your health care provider may ask you questions about your:  Alcohol use.  Tobacco use.  Drug use.  Emotional  well-being.  Home and relationship well-being.  Sexual activity.  Eating habits.  History of falls.  Memory and ability to understand (cognition).  Work and work Statistician.  Reproductive health. Screening  You may have the following tests or measurements:  Height, weight, and BMI.  Blood pressure.  Lipid and cholesterol levels. These may be checked every 5 years, or more frequently if you are over 69 years old.  Skin check.  Lung cancer screening. You may have this screening every year starting at age 50 if you have a 30-pack-year history of smoking and currently smoke or have quit within the past 15 years.  Fecal occult blood test (FOBT) of the stool. You may have this test every year starting at age 2.  Flexible sigmoidoscopy or colonoscopy. You may have a sigmoidoscopy every 5 years or a colonoscopy every 10 years starting at age 80.  Hepatitis C blood test.  Hepatitis B blood test.  Sexually transmitted disease (STD) testing.  Diabetes screening. This is done by checking your blood sugar (glucose) after you have not eaten for a while (fasting). You may have this done every 1-3 years.  Bone density scan. This is done to screen for osteoporosis. You may have this done starting at age 61.  Mammogram. This may be done every 1-2 years. Talk to your health care provider about how often you should have regular mammograms. Talk with your health care provider about your test results, treatment options, and if necessary, the need for more tests. Vaccines  Your health care provider may recommend certain vaccines, such as:  Influenza vaccine. This is recommended every year.  Tetanus, diphtheria, and acellular pertussis (Tdap, Td) vaccine. You may need a Td booster every 10 years.  Zoster vaccine. You may need this after age 61.  Pneumococcal 13-valent conjugate (PCV13) vaccine. One dose is recommended after age 54.  Pneumococcal polysaccharide (PPSV23) vaccine. One  dose is recommended after age 41. Talk to your health care provider about which screenings and vaccines you need and how often you need them. This information is not intended to replace advice given to you by your health care provider. Make sure you discuss any questions you have with your health care provider. Document Released: 01/22/2015 Document Revised: 09/15/2015 Document Reviewed: 10/27/2014 Elsevier Interactive Patient Education  2017 [place] Prevention in the Home Falls can cause injuries. They can happen to people of all ages. There are many things you can do to make your home safe and to help prevent falls. What can I do on the outside of my home?  Regularly fix the edges of walkways and driveways and fix any cracks.  Remove anything that might make you trip as you walk through a door, such as a raised step or threshold.  Trim any bushes or trees on the path to your home.  Use bright outdoor lighting.  Clear any walking paths of anything that might make someone trip, such as rocks or tools.  Regularly check to see if handrails are loose or broken. Make sure that both sides of any steps have handrails.  Any raised decks and porches should have guardrails on the edges.  Have any leaves, snow, or ice cleared regularly.  Use sand or salt on walking paths during winter.  Clean up any spills in your garage right away. This includes oil or grease spills. What can I do in the bathroom?  Use night lights.  Install grab bars by the toilet and in the tub and shower. Do not use towel bars as grab bars.  Use non-skid mats or decals in the tub or shower.  If you need to sit down in the shower, use a plastic, non-slip stool.  Keep the floor dry. Clean up any water that spills on the floor as soon as it happens.  Remove soap buildup in the tub or shower regularly.  Attach bath mats securely with double-sided non-slip rug tape.  Do not have throw rugs and other  things on the floor that can make you trip. What can I do in the bedroom?  Use night lights.  Make sure that you have a light by your bed that is easy to reach.  Do not use any sheets or blankets that are too big for your bed. They should not hang down onto the floor.  Have a firm chair that has side arms. You can use this for support while you get dressed.  Do not have throw rugs and other things on the floor that can make you trip. What can I do in the kitchen?  Clean up any spills right away.  Avoid walking on wet floors.  Keep items that you use a lot in easy-to-reach places.  If you need to reach something above you, use a strong step stool that has a grab bar.  Keep electrical cords out of the way.  Do not use floor polish or wax that makes floors slippery. If you must use wax, use non-skid floor wax.  Do not have throw rugs and other things on the floor that can make you trip. What can I do with my stairs?  Do not leave any items on the stairs.  Make sure that there are  handrails on both sides of the stairs and use them. Fix handrails that are broken or loose. Make sure that handrails are as long as the stairways.  Check any carpeting to make sure that it is firmly attached to the stairs. Fix any carpet that is loose or worn.  Avoid having throw rugs at the top or bottom of the stairs. If you do have throw rugs, attach them to the floor with carpet tape.  Make sure that you have a light switch at the top of the stairs and the bottom of the stairs. If you do not have them, ask someone to add them for you. What else can I do to help prevent falls?  Wear shoes that:  Do not have high heels.  Have rubber bottoms.  Are comfortable and fit you well.  Are closed at the toe. Do not wear sandals.  If you use a stepladder:  Make sure that it is fully opened. Do not climb a closed stepladder.  Make sure that both sides of the stepladder are locked into place.  Ask  someone to hold it for you, if possible.  Clearly mark and make sure that you can see:  Any grab bars or handrails.  First and last steps.  Where the edge of each step is.  Use tools that help you move around (mobility aids) if they are needed. These include:  Canes.  Walkers.  Scooters.  Crutches.  Turn on the lights when you go into a dark area. Replace any light bulbs as soon as they burn out.  Set up your furniture so you have a clear path. Avoid moving your furniture around.  If any of your floors are uneven, fix them.  If there are any pets around you, be aware of where they are.  Review your medicines with your doctor. Some medicines can make you feel dizzy. This can increase your chance of falling. Ask your doctor what other things that you can do to help prevent falls. This information is not intended to replace advice given to you by your health care provider. Make sure you discuss any questions you have with your health care provider. Document Released: 10/22/2008 Document Revised: 06/03/2015 Document Reviewed: 01/30/2014 Elsevier Interactive Patient Education  2017 [employer].

## 2020-05-11 NOTE — Progress Notes (Signed)
Subjective:   Dawn Reyes is a 70 y.o. female who presents for Medicare Annual (Subsequent) preventive e xamination.  I connected with Armstead Peaks  today by telephone and verified that I am speaking with the correct person using two identifiers. Location patient: home Location provider: work Persons participating in the virtual visit: patient, provider.  Video would not connect pt agreed to telephone call. I discussed the limitations, risks, security and privacy concerns of performing an evaluation and management service by telephone and the availability of in person appointments. I also discussed with the patient that there may be a patient responsible charge related to this service. The patient expressed understanding and verbally consented to this telephonic visit.    Interactive audio and video telecommunications were attempted between this provider and patient, however failed, due to patient having technical difficulties OR patient did not have access to video capability.  We continued and completed visit with audio only.     Review of Systems    n/a Cardiac Risk Factors include: advanced age (>30mn, >>61women);hypertension;dyslipidemia     Objective:    Today's Vitals   There is no height or weight on file to calculate BMI.  Advanced Directives 05/11/2020 03/09/2020 04/27/2016 08/19/2015 08/28/2014 08/28/2014 02/16/2014  Does Patient Have a Medical Advance Directive? No No Yes Yes Yes No No  Type of Advance Directive - -Public librarianLiving will Living will;Healthcare Power of [place] will;Advance instruction for mental health treatment;Mental Health Advance Directive - -  Does patient want to make changes to medical advance directive? - - - No - Patient declined No - Patient declined - -  Copy of [place]? - - No - copy requested - No - copy requested - -  Would patient like information on creating a  medical advance directive? No - Patient declined No - Patient declined - - No - patient declined information No - patient declined information Yes - Educational materials given    Current Medications (verified) Outpatient Encounter Medications as of 05/11/2020  Medication Sig  . CALCIUM PO Take by mouth.  . Cholecalciferol (VITAMIN D3) 2000 UNITS TABS Take 1 tablet by mouth daily.  . fluticasone (FLONASE) 50 MCG/ACT nasal spray Place 2 sprays into both nostrils daily. (Patient taking differently: Place 2 sprays into both nostrils daily.)  . gabapentin (NEURONTIN) 300 MG capsule Take 3 capsules (900 mg total) by mouth 2 (two) times daily.  . hydrochlorothiazide (MICROZIDE) 12.5 MG capsule Take 1 capsule (12.5 mg total) by mouth daily.  .Javier DockerOil 300 MG CAPS Take 1 capsule by mouth daily.  . Multiple Vitamin (MULTIVITAMIN) tablet Take 1 tablet by mouth daily.  . potassium chloride (KLOR-CON) 10 MEQ tablet Take 1 tablet (10 mEq total) by mouth daily.  . Red Yeast Rice Extract (RED YEAST RICE PO) Take 2,000 mg by mouth daily.  . zoledronic acid (RECLAST) 5 MG/100ML SOLN injection Inject 100 mLs (5 mg total) into the vein once.   No facility-administered encounter medications on file as of 05/11/2020.    Allergies (verified) Vicodin [hydrocodone-acetaminophen]   History: Past Medical History:  Diagnosis Date  . Anxiety   . Benign hypertension   . 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 Candidiasis of mouth   . Depression and Anxiety - followed by Dr. KToy Carein psychiatry 07/16/2012  . Fibromyalgia   . GERD (gastroesophageal reflux disease)   . Hematochezia 02/13/2012  . Left sided abdominal  pain 02/13/2012  . Osteoporosis, unspecified 07/30/2012   Past Surgical History:  Procedure Laterality Date  . ABDOMINAL HYSTERECTOMY     no concern for cancer; menorrhagia  . BREAST EXCISIONAL BIOPSY Right 11/14/2002   Radial Scar  . FOOT SURGERY Bilateral    mortons neuroma removal  . TONSILLECTOMY      Family History  Problem Relation Age of Onset  . Hypertension Father   . Lung cancer Father   . Liver cancer Father   . Epilepsy Father   . Lung cancer Mother   . Arthritis Mother   . Hypertension Sister   . Osteoporosis Sister   . Hypertension Brother   . Arthritis Brother   . Lung cancer Brother        small cell  . Colon cancer Son 31  . Diabetes Neg Hx    Social History   Socioeconomic History  . Marital status: Widowed    Spouse name: Not on file  . Number of children: 2  . Years of education: Not on file  . Highest education level: Not on file  Occupational History  . Occupation: CSR    Employer: [employer]  Tobacco Use  . Smoking status: Never Smoker  . Smokeless tobacco: Never Used  Vaping Use  . Vaping Use: Never used  Substance and Sexual Activity  . Alcohol use: No    Alcohol/week: 0.0 standard drinks  . Drug use: No  . Sexual activity: Never  Other Topics Concern  . Not on file  Social History Narrative   Work or School: [employer]      Home Situation: lives with husband      Spiritual Beliefs: Baptist      Lifestyle: no regular exercise; diet is ok               Scientist, physiological Strain: Low Risk   . Difficulty of Paying Living Expenses: Not hard at all  Food Insecurity: No Food Insecurity  . Worried About Charity fundraiser in the Last Year: Never true  . Ran Out of Food in the Last Year: Never true  Transportation Needs: No Transportation Needs  . Lack of Transportation (Medical): No  . Lack of Transportation (Non-Medical): No  Physical Activity: Sufficiently Active  . Days of Exercise per Week: 7 days  . Minutes of Exercise per Session: 50 min  Stress: No Stress Concern Present  . Feeling of Stress : Not at all  Social Connections: Moderately Isolated  . Frequency of Communication with Friends and Family: More than three times a week  . Frequency of Social Gatherings  with Friends and Family: More than three times a week  . Attends Religious Services: 1 to 4 times per year  . Active Member of Clubs or Organizations: No  . Attends Archivist Meetings: Never  . Marital Status: Widowed    Tobacco Counseling Counseling given: Not Answered   Clinical Intake:  Pre-visit preparation completed: Yes  Pain : No/denies pain     Nutritional Risks: None Diabetes: No  How often do you need to have someone help you when you read instructions, pamphlets, or other written materials from your doctor or pharmacy?: 1 - Never What is the last grade level you completed in school?: high school  Diabetic?no  Interpreter Needed?: No  Information entered by :: [facility] of Daily Living In your present state of health,  do you have any difficulty performing the following activities: 05/11/2020  Hearing? N  Vision? N  Difficulty concentrating or making decisions? N  Walking or climbing stairs? N  Dressing or bathing? N  Doing errands, shopping? N  Preparing Food and eating ? N  Using the Toilet? N  In the past six months, have you accidently leaked urine? N  Do you have problems with loss of bowel control? N  Managing your Medications? N  Managing your Finances? N  Housekeeping or managing your Housekeeping? N  Some recent data might be hidden    Patient Care Team: Caren Macadam, MD as PCP - General (Family Medicine) Paula Compton, MD as Attending Physician (Obstetrics and Gynecology) Irene Shipper, MD as Attending Physician (Gastroenterology) [place], Jennefer Bravo, MD as Referring Physician (Dermatology)  Indicate any recent Medical Services you may have received from other than [facility] providers in the past year (date may be approximate).     Assessment:   This is a routine wellness examination for Dawn Reyes.  Hearing/Vision screen  Hearing Screening   _0  _1  _2  _3  _4  _5  _6  _7  _8    Right ear:           Left ear:           Vision Screening Comments: Annual eye exam   Dietary issues and exercise activities discussed: Current Exercise Habits: Home exercise routine, Type of exercise: walking, Time (Minutes): 50, Frequency (Times/Week): 5, Weekly Exercise (Minutes/Week): 250, Intensity: Mild, Exercise limited by: None identified  Goals Addressed            This Visit's Progress   . Weight Loss Achieved       Evidence-based guidance:   Review medication that may contribute to weight gain, such as corticosteroid, beta-blocker, tricyclic antidepressant, oral antihyperglycemic; advocate for changes when appropriate.   Perform or refer to registered dietitian to perform comprehensive nutrition assessment that includes disordered-eating behaviors, such as binge-eating, emotional or compulsive eating, grazing.   Counsel patient regarding health risks of obesity and that weight loss goal of 5 to 10 percent of initial weight will improve risk.   Recommend initial weight loss goal of 3 to 5 percent of bodyweight; increase weight-loss goals based on patient success as achieving greater weight loss continues to reduce risk.   Propose a calorie-reduced diet based on the patient's preferences and health status.   Provide ongoing emotional support or cognitive behavioral therapy and dietitian services (individual, group, virtual) over at least 6 months with a minimum of 14 encounters to best facilitate weight loss.   Provide monthly follow-up for 12 months when weight loss goal is met to assist with maintenance of weight loss.   Encourage increased physical activity or exercise based on individual age, risk, and ability up to 200 to 300 minutes per week that includes aerobic and resistance training.   Encourage reduction in sedentary behaviors by replacing them with nonexercise yet active leisure pursuits.   Identify physical barriers, such as change in posture, balance, gait  patterns, joint pain, and environmental barriers to activity.   Consider referral to rehabilitation therapy, especially when mobility or function is impaired due to osteoarthritis and obesity.   Consider referral to weight-loss program that has published evidence of safety and efficacy if on-site intensive intervention is unavailable or patient preference.   Prepare patient for use of pharmacologic therapy as an adjunct to lifestyle changes based on body mass index, patient agreement and presence of risk factors or comorbidities.  Evaluate efficacy of pharmacologic therapy (weight loss) and tolerance to medication periodically.   Engage in shared decision-making regarding referral to bariatric surgeon for consultation and evaluation when weight-loss goal has not been accomplished by behavioral therapy with or without pharmacologic therapy.   Notes:       Depression Screen PHQ 2/9 Scores 05/11/2020 01/05/2020 05/03/2017 04/27/2016 03/26/2015 01/29/2014  PHQ - 2 Score 0 0 0 0 1 1    Fall Risk Fall Risk  05/11/2020 01/05/2020 08/23/2017 05/03/2017 09/18/2016  Falls in the past year? 0 0 No No No  Number falls in past yr: 0 0 - - -  Injury with Fall? 0 - - - -  Follow up Falls evaluation completed - - - -    FALL RISK PREVENTION PERTAINING TO THE HOME:  Any stairs in or around the home? No  If so, are there any without handrails? No  Home free of loose throw rugs in walkways, pet beds, electrical cords, etc? yes Adequate lighting in your home to reduce risk of falls? Yes   ASSISTIVE DEVICES UTILIZED TO PREVENT FALLS:  Life alert? No  Use of a cane, walker or w/c? No  Grab bars in the bathroom? Yes  Shower chair or bench in shower? Yes  Elevated toilet seat or a handicapped toilet? No   Cognitive Function:     Normal cognitive status assessed by direct observation by this Nurse Health Advisor. No abnormalities found.      Immunizations Immunization History  Administered Date(s)  Administered  . Tdap 12/20/2012    TDAP status: Up to date  Flu Vaccine status: Due, Education has been provided regarding the importance of this vaccine. Advised may receive this vaccine at local pharmacy or Health Dept. Aware to provide a copy of the vaccination record if obtained from local pharmacy or Health Dept. Verbalized acceptance and understanding.  Pneumococcal vaccine status: Due, Education has been provided regarding the importance of this vaccine. Advised may receive this vaccine at local pharmacy or Health Dept. Aware to provide a copy of the vaccination record if obtained from local pharmacy or Health Dept. Verbalized acceptance and understanding.  Covid-19 vaccine status: Declined, Education has been provided regarding the importance of this vaccine but patient still declined. Advised may receive this vaccine at local pharmacy or Health Dept.or vaccine clinic. Aware to provide a copy of the vaccination record if obtained from local pharmacy or Health Dept. Verbalized acceptance and understanding.  Qualifies for Shingles Vaccine? Yes   Zostavax completed No   Shingrix Completed?: No.    Education has been provided regarding the importance of this vaccine. Patient has been advised to call insurance company to determine out of pocket expense if they have not yet received this vaccine. Advised may also receive vaccine at local pharmacy or Health Dept. Verbalized acceptance and understanding.  Screening Tests Health Maintenance  Topic Date Due  . COVID-19 Vaccine (1) Never done  . PNA vac Low Risk Adult (1 of 2 - PCV13) 04/27/2021 (Originally 10/01/2014)  . MAMMOGRAM  06/03/2020  . INFLUENZA VACCINE  08/09/2020  . COLONOSCOPY (Pts 45-52yr Insurance coverage will need to be confirmed)  08/23/2022  . TETANUS/TDAP  12/21/2022  . DEXA SCAN  Completed  . Hepatitis C Screening  Completed  . HPV VACCINES  Aged Out    Health Maintenance  Health Maintenance Due  Topic Date Due  .  COVID-19 Vaccine (1) Never done    Colorectal cancer screening: Type of screening: Colonoscopy. Completed  08/22/2017. Repeat every 5 years  Mammogram status: Ordered 05/11/2020. Pt provided with contact info and advised to call to schedule appt.   Bone Density status: Ordered 05/07/2020. Pt provided with contact info and advised to call to schedule appt.  Lung Cancer Screening: (Low Dose CT Chest recommended if Age 85-80 years, 30 pack-year currently smoking OR have quit w/in 15years.) does not qualify.   Lung Cancer Screening Referral: n/a  Additional Screening:  Hepatitis C Screening: does qualify; Completed 04/27/2016   Vision Screening: Recommended annual ophthalmology exams for early detection of glaucoma and other disorders of the eye. Is the patient up to date with their annual eye exam?  Yes  Who is the provider or what is the name of the office in which the patient attends annual eye exams? Dr.Fox If pt is not established with a provider, would they like to be referred to a provider to establish care? No .   Dental Screening: Recommended annual dental exams for proper oral hygiene  Community Resource Referral / Chronic Care Management: CRR required this visit?  No   CCM required this visit?  No      Plan:     I have personally reviewed and noted the following in the patient's chart:   . Medical and social history . Use of alcohol, tobacco or illicit drugs  . Current medications and supplements including opioid prescriptions.  . Functional ability and status . Nutritional status . Physical activity . Advanced directives . List of other physicians . Hospitalizations, surgeries, and ER visits in previous 12 months . Vitals . Screenings to include cognitive, depression, and falls . Referrals and appointments  In addition, I have reviewed and discussed with patient certain preventive protocols, quality metrics, and best practice recommendations. A written  personalized care plan for preventive services as well as general preventive health recommendations were provided to patient.     Randel Pigg, LPN   0/02/2177   Nurse Notes: none

## 2020-05-14 ENCOUNTER — Other Ambulatory Visit: Payer: Self-pay

## 2020-05-14 ENCOUNTER — Ambulatory Visit (INDEPENDENT_AMBULATORY_CARE_PROVIDER_SITE_OTHER)
Admission: RE | Admit: 2020-05-14 | Discharge: 2020-05-14 | Disposition: A | Discharge status: Home / Self Care | Payer: Medicare HMO | Source: Ambulatory Visit | Attending: Endocrinology | Admitting: Endocrinology

## 2020-05-14 DIAGNOSIS — M810 Age-related osteoporosis without current pathological fracture: Secondary | ICD-10-CM

## 2020-05-17 NOTE — Progress Notes (Signed)
Please call to let patient know that the bone density is improved and no longer osteoporosis, will wait till next year to restart Reclast.

## 2020-05-20 ENCOUNTER — Telehealth: Payer: Self-pay | Admitting: Family Medicine

## 2020-05-20 NOTE — Progress Notes (Signed)
  Chronic Care Management   Outreach Note  05/20/2020 Name: TIMOTHY TOWNSEL MRN: [id number] DOB: [birth year]  Referred by: Caren Macadam, MD Reason for referral : No chief complaint on file.   An unsuccessful telephone outreach was attempted today. The patient was referred to the pharmacist for assistance with care management and care coordination.   Follow Up Plan:   Carley Perdue [employer] Scheduler

## 2020-05-21 ENCOUNTER — Telehealth: Payer: Self-pay | Admitting: Family Medicine

## 2020-05-21 NOTE — Progress Notes (Signed)
  Chronic Care Management   Note  05/21/2020 Name: Dawn Reyes MRN: [id number] DOB: November 13, 1950  [name] is a 70 y.o. year old female who is a primary care patient of Koberlein, Steele Berg, MD. I reached out to [name] by phone today in response to a referral sent by Ms. Arline Asp PCP, Caren Macadam, MD.   [name] was given information about Chronic Care Management services today including:  1. CCM service includes personalized support from designated clinical staff supervised by her physician, including individualized plan of care and coordination with other care providers 2. 24/7 contact phone numbers for assistance for urgent and routine care needs. 3. Service will only be billed when office clinical staff spend 20 minutes or more in a month to coordinate care. 4. Only one practitioner may furnish and bill the service in a calendar month. 5. The patient may stop CCM services at any time (effective at the end of the month) by phone call to the office staff.   Patient agreed to services and verbal consent obtained.   Follow up plan:   Carley Perdue [employer] Scheduler

## 2020-06-05 ENCOUNTER — Other Ambulatory Visit: Payer: Self-pay | Admitting: Family Medicine

## 2020-06-05 DIAGNOSIS — E876 Hypokalemia: Secondary | ICD-10-CM

## 2020-06-21 ENCOUNTER — Telehealth: Payer: Self-pay | Admitting: Pharmacist

## 2020-06-21 NOTE — Chronic Care Management (AMB) (Signed)
Chronic Care Management Pharmacy Assistant   Name: Dawn Reyes  MRN: [id number] DOB: 04/05/1950  Dawn Reyes is an 70 y.o. year old female who presents for herinitial CCM visit with the clinical pharmacist.  Reason for Encounter: Chart Prep for initial visit with [name] clinical pharmacist on 06/24/20.    Conditions to be addressed/monitored: HTN and Osteoporosis and Neuralgia.   Primary concerns for visit include:   Recent office visits:  03/01/20 Micheline Rough MD (PCP) - seen for trochanteric bursitis of left hip and other issues. No medication changes. Received triamcinolone acetonide injection of 40mg  in office. Referral to physical therapy placed. Follow up in 3 months.   01/05/20 Micheline Rough MD (PCP) - seen for Arthralgia of unspecified joint and other issues. Patient prescribed Zoster vaccine 41mcg/ 0.46ml once and repeat in 2-6 months. Follow up in 6 months.   Recent consult visits:  05/07/20 Elayne Snare MD (Endocrinology) - presented to clinic for follow up on Osteoporosis. No medication changes. Follow up in 1 year. Patient on reclast for yearly infusions for condition.   02/18/20 Darrick Meigs (Dermatology) - presented to clinic for Melanocytic nevi of trunk inflamed seborrheic keratosis. Destruction of up to 14 lesions performed in office. No medication changes or follow up noted.  12/17/19 Metta Clines DO (Neurology) - presented to clinic for follow up on burning tongue syndrome and other issues. No medication changes. Follow up in 1 year.   Hospital visits:  None in previous 6 months  Fill History:   GABAPENTIN 300MG     CAP 12/17/2019 90   POTA CHLORIDE ER 10MEQ TAB 09/21/2019 90   HYDROCHLOROTHIAZIDE 12.5MG  CAP 12/19/2019 30    Medications: Outpatient Encounter Medications as of 06/21/2020  Medication Sig   CALCIUM PO Take by mouth.   Cholecalciferol (VITAMIN D3) 2000 UNITS TABS Take 1 tablet by mouth daily.   fluticasone (FLONASE) 50 MCG/ACT  nasal spray Place 2 sprays into both nostrils daily. (Patient taking differently: Place 2 sprays into both nostrils daily.)   gabapentin (NEURONTIN) 300 MG capsule Take 3 capsules (900 mg total) by mouth 2 (two) times daily.   hydrochlorothiazide (MICROZIDE) 12.5 MG capsule TAKE 1 CAPSULE EVERY DAY   Krill Oil 300 MG CAPS Take 1 capsule by mouth daily.   Multiple Vitamin (MULTIVITAMIN) tablet Take 1 tablet by mouth daily.   potassium chloride (KLOR-CON) 10 MEQ tablet TAKE 1 TABLET EVERY DAY   Red Yeast Rice Extract (RED YEAST RICE PO) Take 2,000 mg by mouth daily.   zoledronic acid (RECLAST) 5 MG/100ML SOLN injection Inject 100 mLs (5 mg total) into the vein once.   No facility-administered encounter medications on file as of 06/21/2020.    Have you seen any other providers since your last visit?  Any changes in your medications or health?  Any side effects from any medications?  Do you have an symptoms or problems not managed by your medications?  Any concerns about your health right now?  Has your provider asked that you check blood pressure, blood sugar, or follow special diet at home?  Do you get any type of exercise on a regular basis?  Can you think of a goal you would like to reach for your health?  Do you have any problems getting your medications?  Is there anything that you would like to discuss during the appointment?   Please bring medications and supplements to appointment   Patient was seen for her appointment this morning. Patient never  returned my calls.   Star Rating Drugs:  None.   [place]  Clinical Pharmacist Assistant [phone]

## 2020-06-24 ENCOUNTER — Ambulatory Visit (INDEPENDENT_AMBULATORY_CARE_PROVIDER_SITE_OTHER): Payer: Medicare HMO | Admitting: Pharmacist

## 2020-06-24 ENCOUNTER — Other Ambulatory Visit: Payer: Self-pay

## 2020-06-24 VITALS — BP 136/72

## 2020-06-24 DIAGNOSIS — E785 Hyperlipidemia, unspecified: Secondary | ICD-10-CM

## 2020-06-24 DIAGNOSIS — I10 Essential (primary) hypertension: Secondary | ICD-10-CM

## 2020-06-24 NOTE — Progress Notes (Signed)
Chronic Care Management Pharmacy Note  07/08/2020 Name:  Dawn Reyes MRN:  [id number] DOB:  April 21, 1950  Summary: LDL above goal < 100 BP at goal < 140/90 per office readings  Recommendations/Changes made from today's visit: -Recommend moderate intensity statin therapy to replace red yeast rice -Recommend repeat lipid panel in 4-6 weeks after statin initiation -Recommended for patient to monitor BP at home regularly  Plan: Follow up in 6 months   Subjective: Dawn Reyes is an 70 y.o. year old female who is a primary patient of Koberlein, Steele Berg, MD.  The CCM team was consulted for assistance with disease management and care coordination needs.    Engaged with patient face to face for initial visit in response to provider referral for pharmacy case management and/or care coordination services.   Consent to Services:  The patient was given information about Chronic Care Management services, agreed to services, and gave verbal consent prior to initiation of services.  Please see initial visit note for detailed documentation.   Patient Care Team: Caren Macadam, MD as PCP - General (Family Medicine) Paula Compton, MD as Attending Physician (Obstetrics and Gynecology) Irene Shipper, MD as Attending Physician (Gastroenterology) Haverstock, Jennefer Bravo, MD as Referring Physician (Dermatology) Viona Gilmore, [facility] as Pharmacist (Pharmacist)  Recent office visits: 05/11/20 Randel Pigg, LPN: Patient presented for AWV.  03/01/20 Micheline Rough MD (PCP) - seen for trochanteric bursitis of left hip and other issues. No medication changes. Received triamcinolone acetonide injection of 34m in office. Referral to physical therapy placed. Follow up in 3 months.   01/05/20 JMicheline RoughMD (PCP) - seen for Arthralgia of unspecified joint and other issues. Patient prescribed Zoster vaccine 5259m/ 0.59m53mnce and repeat in 2-6 months. Follow up in 6 months.   Recent consult  visits: 05/07/20 AjaElayne Snare (Endocrinology) - presented to clinic for follow up on Osteoporosis. No medication changes. Follow up in 1 year. Patient on reclast for yearly infusions for condition.  04/13/20 AndMargie BilletT (rehab): Patient presented for PT treatment for muscle weakness.   02/18/20 SeaDarrick Meigsermatology) - presented to clinic for Melanocytic nevi of trunk inflamed seborrheic keratosis. Destruction of up to 14 lesions performed in office. No medication changes or follow up noted.   12/17/19 AdaMetta Clines (Neurology) - presented to clinic for follow up on burning tongue syndrome and other issues. No medication changes. Follow up in 1 year.  Hospital visits: None in previous 6 months   Objective:  Lab Results  Component Value Date   CREATININE 0.76 05/04/2020   BUN 12 05/04/2020   GFR 79.30 05/04/2020   NA 135 05/04/2020   K 3.7 05/04/2020   CALCIUM 9.3 05/04/2020   CO2 31 05/04/2020   GLUCOSE 85 05/04/2020    Lab Results  Component Value Date/Time   HGBA1C 5.6 01/05/2020 11:33 AM   HGBA1C 5.5 12/20/2018 10:57 AM   GFR 79.30 05/04/2020 08:39 AM   GFR 80.75 01/05/2020 11:33 AM    Last diabetic Eye exam: No results found for: HMDIABEYEEXA  Last diabetic Foot exam: No results found for: HMDIABFOOTEX   Lab Results  Component Value Date   CHOL 205 (H) 01/05/2020   HDL 71.70 01/05/2020   LDLCALC 117 (H) 01/05/2020   LDLDIRECT 131.7 12/20/2012   TRIG 84.0 01/05/2020   CHOLHDL 3 01/05/2020    Hepatic Function Latest Ref Rng & Units 05/04/2020 01/05/2020 12/20/2018  Total Protein 6.0 - 8.3 g/dL 6.7 6.6 6.9  Albumin 3.5 - 5.2 g/dL 4.2 4.5 4.6  AST 0 - 37 U/L '23 26 20  ' ALT 0 - 35 U/L '14 19 16  ' Alk Phosphatase 39 - 117 U/L 41 38(L) 53  Total Bilirubin 0.2 - 1.2 mg/dL 0.7 0.6 0.5  Bilirubin, Direct 0.0 - 0.3 mg/dL - - -    Lab Results  Component Value Date/Time   TSH 2.90 12/20/2018 10:57 AM   TSH 2.49 07/30/2012 08:41 AM   FREET4 0.75 07/30/2012  08:41 AM    CBC Latest Ref Rng & Units 01/05/2020 12/20/2018 05/03/2017  WBC 4.0 - 10.5 K/uL 5.0 5.1 4.6  Hemoglobin 12.0 - 15.0 g/dL 13.4 13.7 12.9  Hematocrit 36.0 - 46.0 % 39.7 41.2 38.1  Platelets 150.0 - 400.0 K/uL 322.0 358.0 379.0    Lab Results  Component Value Date/Time   VD25OH 48.49 05/04/2020 08:39 AM   VD25OH 47.23 04/28/2019 08:26 AM    Clinical ASCVD: No  The ASCVD Risk score ([place]., et al., 2013) failed to calculate for the following reasons:   The systolic blood pressure is missing    Depression screen [facility] 2/9 05/11/2020 01/05/2020 05/03/2017  Decreased Interest 0 0 0  Down, Depressed, Hopeless 0 0 0  PHQ - 2 Score 0 0 0      Social History   Tobacco Use  Smoking Status Never  Smokeless Tobacco Never   BP Readings from Last 3 Encounters:  05/07/20 136/82  03/01/20 136/80  01/05/20 128/84   Pulse Readings from Last 3 Encounters:  05/07/20 64  03/01/20 72  01/05/20 69   Wt Readings from Last 3 Encounters:  05/07/20 145 lb (65.8 kg)  03/01/20 150 lb 8 oz (68.3 kg)  01/05/20 148 lb 4.8 oz (67.3 kg)   BMI Readings from Last 3 Encounters:  05/07/20 26.52 kg/m  03/01/20 27.98 kg/m  01/05/20 27.57 kg/m    Assessment/Interventions: Review of patient past medical history, allergies, medications, health status, including review of consultants reports, laboratory and other test data, was performed as part of comprehensive evaluation and provision of chronic care management services.   SDOH:  (Social Determinants of Health) assessments and interventions performed: Yes SDOH Interventions    Flowsheet Row Most Recent Value  SDOH Interventions   Financial Strain Interventions Intervention Not Indicated  Transportation Interventions Intervention Not Indicated      SDOH Screenings   Alcohol Screen: Low Risk    Last Alcohol Screening Score (AUDIT): 0  Depression (PHQ2-9): Low Risk    PHQ-2 Score: 0  Financial Resource Strain: Low Risk     Difficulty of Paying Living Expenses: Not hard at all  Food Insecurity: No Food Insecurity   Worried About Charity fundraiser in the Last Year: Never true   Ran Out of Food in the Last Year: Never true  Housing: Low Risk    Last Housing Risk Score: 0  Physical Activity: Sufficiently Active   Days of Exercise per Week: 7 days   Minutes of Exercise per Session: 50 min  Social Connections: Moderately Isolated   Frequency of Communication with Friends and Family: More than three times a week   Frequency of Social Gatherings with Friends and Family: More than three times a week   Attends Religious Services: 1 to 4 times per year   Active Member of [employer] or Organizations: No   Attends Archivist Meetings: Never   Marital Status: Widowed  Stress: No Stress Concern Present   Feeling of  Stress : Not at all  Tobacco Use: Low Risk    Smoking Tobacco Use: Never   Smokeless Tobacco Use: Never  Transportation Needs: No Transportation Needs   Lack of Transportation (Medical): No   Lack of Transportation (Non-Medical): No   Patient is walking 2-5 miles a day almost every day first thing in the morning. Patient joined the [facility] and is seeing a trainer to go through different machines. Patient doesn't sit during the day and usually works in the yard or makes furniture in her shed. She also occasionally watches her great grandchildren who live in [place].  She does all of the cooking and cleaning. She does eat out occasionally. She usually cooks chicken, potato and salad, OR fish, potato and salad. She recently join [employer] and lost 13 lbs. She has also been drinking smoothies as a meal, which include strawberries and bananas. For breakfast, she eats oatmeal with apples or eggs and toast. She does eat desserts every once in a while and loves sweets but tries to limit them. She drinks water and no sweet tea or soda or juice.  Her sleep varies every night and sometimes she feels tired. She gets  about 5-7 hours of sleep per night and does not take naps. It takes her a while to shut off her mind before bed.  Patient denies any problems with medications and uses a weekly pill box. If she misses her medicines, she misses them all. Recommended moving everything to before eating to help her remember to take.  CCM Care Plan  Allergies  Allergen Reactions   Vicodin [Hydrocodone-Acetaminophen] Itching    Medications Reviewed Today     Reviewed by Viona Gilmore, [facility] (Pharmacist) on 07/08/20 at 2008  Med List Status: <None>   Medication Order Taking? Sig Documenting Provider Last Dose Status Informant  CALCIUM PO [id number] Yes Take 600 mg by mouth daily. [provider] Taking Active   Cholecalciferol (VITAMIN D3) 2000 UNITS TABS [id number] Yes Take 1 tablet by mouth daily. [provider] Taking Active Self           Med Note Constance Haw, PAMELA C   Sun Feb 16, 2013 11:01 AM)    gabapentin (NEURONTIN) 300 MG capsule [id number]  Take 3 capsules (900 mg total) by mouth 2 (two) times daily. Pieter Partridge, DO  Active   hydrochlorothiazide (MICROZIDE) 12.5 MG capsule [id number] Yes TAKE 1 CAPSULE EVERY DAY Caren Macadam, MD Taking Active   Krill Oil 300 MG CAPS [id number] Yes Take 1 capsule by mouth daily. [provider] Taking Active Self           Med Note Constance Haw, Joie Bimler Feb 16, 2013 11:01 AM)    Multiple Vitamin (MULTIVITAMIN) tablet [id number] Yes Take 1 tablet by mouth daily. [provider] Taking Active Self           Med Note Constance Haw, Joie Bimler Feb 16, 2013 11:02 AM)    potassium chloride (KLOR-CON) 10 MEQ tablet [id number] Yes TAKE 1 TABLET EVERY DAY Koberlein, Junell C, MD Taking Active   Red Yeast Rice Extract (RED YEAST RICE PO) [id number] Yes Take 2,000 mg by mouth daily. [provider] Taking Active   zoledronic acid (RECLAST) 5 MG/100ML SOLN injection [id number] Yes Inject 100 mLs (5 mg total) into the vein once. Elayne Snare, MD Taking Active             Patient Active Problem List  Diagnosis Date Noted   Dyspnea 05/03/2016   Burning tongue syndrome 02/06/2013   Osteoporosis 07/30/2012   Essential hypertension, benign 07/16/2012   Neuralgia 07/16/2012    Immunization History  Administered Date(s) Administered   Tdap 12/20/2012    Conditions to be addressed/monitored:  Hypertension, Hyperlipidemia, Osteoporosis, and Pain  Care Plan : [place]  Updates made by Viona Gilmore, [place] since 07/08/2020 12:00 AM     Problem: Problem: Hypertension, Hyperlipidemia, Osteoporosis, and Pain      Long-Range Goal: Patient-Specific Goal   Start Date: 06/24/2020  Expected End Date: 06/24/2021  This Visit's Progress: On track  Priority: High  Note:   Current Barriers:  Unable to independently monitor therapeutic efficacy Unable to achieve control of cholesterol  Suboptimal therapeutic regimen for cholesterol  Pharmacist Clinical Goal(s):  Patient will achieve adherence to monitoring guidelines and medication adherence to achieve therapeutic efficacy achieve control of cholesterol as evidenced by next lipid panel adhere to plan to optimize therapeutic regimen for cholesterol as evidenced by report of adherence to recommended medication management changes through collaboration with PharmD and provider.   Interventions: 1:1 collaboration with Caren Macadam, MD regarding development and update of comprehensive plan of care as evidenced by provider attestation and co-signature Inter-disciplinary care team collaboration (see longitudinal plan of care) Comprehensive medication review performed; medication list updated in electronic medical record  Hypertension (BP goal <140/90) -Controlled -Current treatment: Hydrochlorothiazide 12.5 mg 1 capsule daily Potassium ER 10 mEq 1 tablet daily -Medications previously tried: none  -Current home readings: nothing above 135/80 -Current  dietary habits: cooks with salt but does limit some; reads package labels for sodium -Current exercise habits: walking daily -Denies hypotensive/hypertensive symptoms -Educated on Daily salt intake goal < 2300 mg; Exercise goal of 150 minutes per week; Importance of home blood pressure monitoring; Proper BP monitoring technique; -Counseled to monitor BP at home weekly, document, and provide log at future appointments -Counseled on diet and exercise extensively Recommended to continue current medication  Hyperlipidemia: (LDL goal < 100) -Uncontrolled -Current treatment: Red yeast rice 600 mg daily - been on for 4 years Krill oil 500 mg 1 capsule daily -Medications previously tried: none  -Current dietary patterns: does not eat out much and tries to limit fried foods -Current exercise habits: walking daily -Educated on Cholesterol goals;  Benefits of statin for ASCVD risk reduction; Importance of limiting foods high in cholesterol; Exercise goal of 150 minutes per week; -Counseled on diet and exercise extensively Educated on risks of taking red yeast rice and lack of efficacy Collaborated with PCP to start statin therapy  Osteoporosis (Goal prevent fractures) -Controlled -Last DEXA Scan: 05/2020   T-Score femoral neck: -2.3  T-Score total hip: n/a  T-Score lumbar spine: -0.4  T-Score forearm radius: n/a  10-year probability of major osteoporotic fracture: n/a  10-year probability of hip fracture: n/a -Patient is a candidate for pharmacologic treatment due to T-Score < -2.5 in femoral neck (previous DEXA) -Current treatment  Reclast 5 mg once yearly (7 years) - will be taking a holiday Calcium 600 mg-vitamin D 400 units Takes 2 every other day - recommended one a day Vitamin D 2000 units daily Multivitamin D (300 mg calcium)  -Medications previously tried: none  -Recommend [phone] units of vitamin D daily. Recommend 1200 mg of calcium daily from dietary and supplemental  sources. Recommend weight-bearing and muscle strengthening exercises for building and maintaining bone density. -Counseled on diet and exercise extensively Recommended to continue current  medication Counseled on drug holiday with Reclast  Restless legs syndrome/burning tongue (Goal: minimize symptoms) -Controlled -Current treatment  Gabapentin 300 mg 3 capsules twice daily -Medications previously tried: none  -Recommended to continue current medication  Health Maintenance -Vaccine gaps: Prevnar 20, COVID (declines) -Current therapy:  Multivitamin 1 tablet daily -Educated on Cost vs benefit of each product must be carefully weighed by individual consumer -Patient is satisfied with current therapy and denies issues -Recommended to continue current medication  Patient Goals/Self-Care Activities Patient will:  - take medications as prescribed focus on medication adherence by moving all medications to before eating check blood pressure weekly, document, and provide at future appointments target a minimum of 150 minutes of moderate intensity exercise weekly  Follow Up Plan: Telephone follow up appointment with care management team member scheduled for:6 months       Medication Assistance: None required.  Patient affirms current coverage meets needs.  Compliance/Adherence/Medication fill history: Care Gaps: COVID  Star-Rating Drugs: None  Patient's preferred pharmacy is:  [facility] - [place], [place] [address] Phone: [phone] Fax: [phone]  Uses pill box? Yes Pt endorses 90% compliance  We discussed: Current pharmacy is preferred with insurance plan and patient is satisfied with pharmacy services Patient decided to: Continue current medication management strategy  Care Plan and Follow Up Patient Decision:  Patient agrees to Care Plan and  Follow-up.  Plan: Telephone follow up appointment with care management team member scheduled for:  6 months  [name], PharmD, [place] Pharmacist [place] [facility] [place] [phone]

## 2020-07-07 ENCOUNTER — Ambulatory Visit: Payer: Medicare Other | Admitting: Family Medicine

## 2020-07-08 ENCOUNTER — Other Ambulatory Visit: Payer: Self-pay

## 2020-07-08 ENCOUNTER — Ambulatory Visit
Admission: RE | Admit: 2020-07-08 | Discharge: 2020-07-08 | Disposition: A | Discharge status: Home / Self Care | Payer: Medicare HMO | Source: Ambulatory Visit | Attending: Family Medicine | Admitting: Family Medicine

## 2020-07-08 DIAGNOSIS — Z1231 Encounter for screening mammogram for malignant neoplasm of breast: Secondary | ICD-10-CM

## 2020-07-08 NOTE — Patient Instructions (Addendum)
Hi Dawn Reyes,  It was great to get to meet you in person! Below is a summary of some of the topics we discussed.   I will reach out to you once I hear back from Dr. Ethlyn Gallery. In the meantime, I have attached some information about high cholesterol that might be helpful. Keep up the good work with your dietary changes and weight loss!  I also scheduled a follow up appointment with you over the telephone (if you prefer in person we can do that) for 6 months down the road but may check in sooner depending on your cholesterol plan. Please reach out to me if you have any questions or need anything before our follow up!  Best, Maddie  [name], PharmD, [place] [facility] [place]   Visit Information   Goals Addressed             This Visit's Progress    Manage My Medicine       Timeframe:  Short-Term Goal Priority:  Medium Start Date:                             Expected End Date:                       Follow Up Date 01/07/21    - call for medicine refill 2 or 3 days before it runs out - keep a list of all the medicines I take; vitamins and herbals too - use a pillbox to sort medicine - use an alarm clock or phone to remind me to take my medicine    Why is this important?   These steps will help you keep on track with your medicines.   Notes:         Patient Care Plan: CCM Pharmacy Care Plan     Problem Identified: Problem: Hypertension, Hyperlipidemia, Osteoporosis, and Pain      Long-Range Goal: Patient-Specific Goal   Start Date: 06/24/2020  Expected End Date: 06/24/2021  This Visit's Progress: On track  Priority: High  Note:   Current Barriers:  Unable to independently monitor therapeutic efficacy Unable to achieve control of cholesterol  Suboptimal therapeutic regimen for cholesterol  Pharmacist Clinical Goal(s):  Patient will achieve adherence to monitoring guidelines and medication adherence to achieve  therapeutic efficacy achieve control of cholesterol as evidenced by next lipid panel adhere to plan to optimize therapeutic regimen for cholesterol as evidenced by report of adherence to recommended medication management changes through collaboration with PharmD and provider.   Interventions: 1:1 collaboration with Caren Macadam, MD regarding development and update of comprehensive plan of care as evidenced by provider attestation and co-signature Inter-disciplinary care team collaboration (see longitudinal plan of care) Comprehensive medication review performed; medication list updated in electronic medical record  Hypertension (BP goal <140/90) -Controlled -Current treatment: Hydrochlorothiazide 12.5 mg 1 capsule daily Potassium ER 10 mEq 1 tablet daily -Medications previously tried: none  -Current home readings: nothing above 135/80 -Current dietary habits: cooks with salt but does limit some; reads package labels for sodium -Current exercise habits: walking daily -Denies hypotensive/hypertensive symptoms -Educated on Daily salt intake goal < 2300 mg; Exercise goal of 150 minutes per week; Importance of home blood pressure monitoring; Proper BP monitoring technique; -Counseled to monitor BP at home weekly, document, and provide log at future appointments -Counseled on diet and exercise extensively Recommended to continue current medication  Hyperlipidemia: (LDL goal < 100) -Uncontrolled -Current treatment: Red yeast rice 600 mg daily - been on for 4 years Krill oil 500 mg 1 capsule daily -Medications previously tried: none  -Current dietary patterns: does not eat out much and tries to limit fried foods -Current exercise habits: walking daily -Educated on Cholesterol goals;  Benefits of statin for ASCVD risk reduction; Importance of limiting foods high in cholesterol; Exercise goal of 150 minutes per week; -Counseled on diet and exercise extensively Educated on risks  of taking red yeast rice and lack of efficacy Collaborated with PCP to start statin therapy  Osteoporosis (Goal prevent fractures) -Controlled -Last DEXA Scan: 05/2020   T-Score femoral neck: -2.3  T-Score total hip: n/a  T-Score lumbar spine: -0.4  T-Score forearm radius: n/a  10-year probability of major osteoporotic fracture: n/a  10-year probability of hip fracture: n/a -Patient is a candidate for pharmacologic treatment due to T-Score < -2.5 in femoral neck (previous DEXA) -Current treatment  Reclast 5 mg once yearly (7 years) - will be taking a holiday Calcium 600 mg-vitamin D 400 units Takes 2 every other day - recommended one a day Vitamin D 2000 units daily Multivitamin D (300 mg calcium)  -Medications previously tried: none  -Recommend [phone] units of vitamin D daily. Recommend 1200 mg of calcium daily from dietary and supplemental sources. Recommend weight-bearing and muscle strengthening exercises for building and maintaining bone density. -Counseled on diet and exercise extensively Recommended to continue current medication Counseled on drug holiday with Reclast  Restless legs syndrome/burning tongue (Goal: minimize symptoms) -Controlled -Current treatment  Gabapentin 300 mg 3 capsules twice daily -Medications previously tried: none  -Recommended to continue current medication  Health Maintenance -Vaccine gaps: Prevnar 20, COVID (declines) -Current therapy:  Multivitamin 1 tablet daily -Educated on Cost vs benefit of each product must be carefully weighed by individual consumer -Patient is satisfied with current therapy and denies issues -Recommended to continue current medication  Patient Goals/Self-Care Activities Patient will:  - take medications as prescribed focus on medication adherence by moving all medications to before eating check blood pressure weekly, document, and provide at future appointments target a minimum of 150 minutes of moderate  intensity exercise weekly  Follow Up Plan: Telephone follow up appointment with care management team member scheduled for:6 months      Dawn Reyes was given information about Chronic Care Management services today including:  CCM service includes personalized support from designated clinical staff supervised by Dawn Reyes physician, including individualized plan of care and coordination with other care providers 24/7 contact phone numbers for assistance for urgent and routine care needs. Standard insurance, coinsurance, copays and deductibles apply for chronic care management only during months in which we provide at least 20 minutes of these services. Most insurances cover these services at 100%, however patients may be responsible for any copay, coinsurance and/or deductible if applicable. This service may help you avoid the need for more expensive face-to-face services. Only one practitioner may furnish and bill the service in a calendar month. The patient may stop CCM services at any time (effective at the end of the month) by phone call to the office staff.  Patient agreed to services and verbal consent obtained.   Patient verbalizes understanding of instructions provided today and agrees to view in [place].  Telephone follow up appointment with pharmacy team member scheduled for: 6 months  Viona Gilmore, [facility]

## 2020-07-13 ENCOUNTER — Telehealth: Payer: Self-pay | Admitting: Pharmacist

## 2020-07-13 NOTE — Telephone Encounter (Signed)
Called patient to follow up on medication changes from CCM visit. Patient was unavailable and left voicemail. Will try to reach her at another time.

## 2020-07-15 ENCOUNTER — Other Ambulatory Visit: Payer: Self-pay

## 2020-07-16 ENCOUNTER — Encounter: Payer: Self-pay | Admitting: Family Medicine

## 2020-07-16 ENCOUNTER — Ambulatory Visit (INDEPENDENT_AMBULATORY_CARE_PROVIDER_SITE_OTHER): Payer: Medicare HMO | Admitting: Family Medicine

## 2020-07-16 VITALS — BP 128/70 | HR 67 | Temp 97.7°F | Ht 62.0 in | Wt 133.7 lb

## 2020-07-16 DIAGNOSIS — M818 Other osteoporosis without current pathological fracture: Secondary | ICD-10-CM | POA: Diagnosis not present

## 2020-07-16 DIAGNOSIS — I10 Essential (primary) hypertension: Secondary | ICD-10-CM

## 2020-07-16 DIAGNOSIS — F430 Acute stress reaction: Secondary | ICD-10-CM | POA: Diagnosis not present

## 2020-07-16 DIAGNOSIS — E785 Hyperlipidemia, unspecified: Secondary | ICD-10-CM | POA: Diagnosis not present

## 2020-07-16 LAB — LIPID PANEL
Cholesterol: 184 mg/dL (ref 0–200)
HDL: 64.8 mg/dL (ref >39.00)
LDL Cholesterol: 103 mg/dL — ABNORMAL HIGH (ref 0–99)
NonHDL: 118.97
Total CHOL/HDL Ratio: 3
Triglycerides: 78 mg/dL (ref 0.0–149.0)
VLDL: 15.6 mg/dL (ref 0.0–40.0)

## 2020-07-16 LAB — COMPREHENSIVE METABOLIC PANEL
ALT: 13 U/L (ref 0–35)
AST: 20 U/L (ref 0–37)
Albumin: 4.6 g/dL (ref 3.5–5.2)
Alkaline Phosphatase: 44 U/L (ref 39–117)
BUN: 10 mg/dL (ref 6–23)
CO2: 31 mEq/L (ref 19–32)
Calcium: 9.4 mg/dL (ref 8.4–10.5)
Chloride: 100 mEq/L (ref 96–112)
Creatinine, Ser: 0.72 mg/dL (ref 0.40–1.20)
GFR: 84.49 mL/min (ref >60.00)
Glucose, Bld: 86 mg/dL (ref 70–99)
Potassium: 4.2 mEq/L (ref 3.5–5.1)
Sodium: 140 mEq/L (ref 135–145)
Total Bilirubin: 0.6 mg/dL (ref 0.2–1.2)
Total Protein: 6.7 g/dL (ref 6.0–8.3)

## 2020-07-16 LAB — CBC WITH DIFFERENTIAL/PLATELET
Basophils Absolute: 0 10*3/uL (ref 0.0–0.1)
Basophils Relative: 0.8 % (ref 0.0–3.0)
Eosinophils Absolute: 0.1 10*3/uL (ref 0.0–0.7)
Eosinophils Relative: 2.2 % (ref 0.0–5.0)
HCT: 40.3 % (ref 36.0–46.0)
Hemoglobin: 13.7 g/dL (ref 12.0–15.0)
Lymphocytes Relative: 27.8 % (ref 12.0–46.0)
Lymphs Abs: 1.6 10*3/uL (ref 0.7–4.0)
MCHC: 34 g/dL (ref 30.0–36.0)
MCV: 93.6 fl (ref 78.0–100.0)
Monocytes Absolute: 0.3 10*3/uL (ref 0.1–1.0)
Monocytes Relative: 5.7 % (ref 3.0–12.0)
Neutro Abs: 3.6 10*3/uL (ref 1.4–7.7)
Neutrophils Relative %: 63.5 % (ref 43.0–77.0)
Platelets: 329 10*3/uL (ref 150.0–400.0)
RBC: 4.3 Mil/uL (ref 3.87–5.11)
RDW: 13.1 % (ref 11.5–15.5)
WBC: 5.6 10*3/uL (ref 4.0–10.5)

## 2020-07-16 MED ORDER — ALPRAZOLAM 0.25 MG PO TABS: 0.1250 mg | ORAL_TABLET | Freq: Two times a day (BID) | ORAL | 0 refills | Status: DC | PRN

## 2020-07-16 NOTE — Addendum Note (Signed)
Addended by: Amanda Cockayne on: 07/16/2020 09:27 AM   Modules accepted: Orders

## 2020-07-16 NOTE — Progress Notes (Signed)
Dawn Reyes DOB: 1950-03-24 Encounter date: 07/16/2020  This is a 70 y.o. female who presents with Chief Complaint  Patient presents with   Follow-up    History of present illness: *states that hip is doing a little better; doing cardio, circuit training - walking 2-4 miles a day. Cardio once a week; also doing tai chi. Cut out most of bread, all sweets. Doing more chicken, fish, oatmeal, fruit, eggs, salads.   HTN: states has been doing well. Usually around 132 at home. Not sure about diastolic but doesn't think higher than 80.   Increased stres with brother in hospital; hip fracture; agitated and behavioral disturbance.   Allergies  Allergen Reactions   Vicodin [Hydrocodone-Acetaminophen] Itching   Current Meds  Medication Sig   ALPRAZolam (XANAX) 0.25 MG tablet Take 0.5-1 tablets (0.125-0.25 mg total) by mouth 2 (two) times daily as needed for anxiety.   CALCIUM PO Take 600 mg by mouth daily.   Cholecalciferol (VITAMIN D3) 2000 UNITS TABS Take 1 tablet by mouth daily.   gabapentin (NEURONTIN) 300 MG capsule Take 3 capsules (900 mg total) by mouth 2 (two) times daily.   hydrochlorothiazide (MICROZIDE) 12.5 MG capsule TAKE 1 CAPSULE EVERY DAY   Krill Oil 300 MG CAPS Take 1 capsule by mouth daily.   Multiple Vitamin (MULTIVITAMIN) tablet Take 1 tablet by mouth daily.   potassium chloride (KLOR-CON) 10 MEQ tablet TAKE 1 TABLET EVERY DAY   zoledronic acid (RECLAST) 5 MG/100ML SOLN injection Inject 100 mLs (5 mg total) into the vein once.    Review of Systems  Constitutional:  Negative for chills, fatigue and fever.  Respiratory:  Negative for cough, chest tightness, shortness of breath and wheezing.   Cardiovascular:  Negative for chest pain, palpitations and leg swelling.  Psychiatric/Behavioral:  Positive for sleep disturbance (stress with caring for brother). The patient is nervous/anxious.    Objective:  BP 128/70   Pulse 67   Temp 97.7 F (36.5 C) (Oral)   Ht '5\' 2"'   (1.575 m)   Wt 133 lb 11.2 oz (60.6 kg)   SpO2 98%   BMI 24.45 kg/m   Weight: 133 lb 11.2 oz (60.6 kg)   BP Readings from Last 3 Encounters:  07/16/20 128/70  06/24/20 136/72  05/07/20 136/82   Wt Readings from Last 3 Encounters:  07/16/20 133 lb 11.2 oz (60.6 kg)  05/07/20 145 lb (65.8 kg)  03/01/20 150 lb 8 oz (68.3 kg)    Physical Exam Constitutional:      General: She is not in acute distress.    Appearance: She is well-developed.  Cardiovascular:     Rate and Rhythm: Normal rate and regular rhythm.     Heart sounds: Normal heart sounds. No murmur heard.   No friction rub.  Pulmonary:     Effort: Pulmonary effort is normal. No respiratory distress.     Breath sounds: Normal breath sounds. No wheezing or rales.  Musculoskeletal:     Right lower leg: No edema.     Left lower leg: No edema.  Neurological:     Mental Status: She is alert and oriented to person, place, and time.  Psychiatric:        Attention and Perception: Attention normal.        Behavior: Behavior normal.     Comments: Tearful, anxious when talking about brother    Assessment/Plan  1. Essential hypertension, benign Well controlled; continue current medication hctz 12.24m daily.   2. Other osteoporosis  without current pathological fracture Continue with reclast, weight bearing exercise.   3. Stress reaction High stress with being medical POA brother who is hospitalized.   We will recheck lipids today; slight LDL elevation last time; discussed statin pending ascvd score.   Return in about 6 months (around 01/16/2021) for Chronic condition visit.       Micheline Rough, MD

## 2020-07-20 NOTE — Telephone Encounter (Signed)
Called patient to follow up on lipid discussion with PCP. PCP is going to send in rosuvastatin 5 mg and patient will take 1/2 tablet daily to lower her cholesterol.  Patient verbalized her understanding and is aware to contact the office with any questions/concerns. Confirmed pharmacy and sent message to PCP.

## 2020-07-21 ENCOUNTER — Other Ambulatory Visit: Payer: Self-pay | Admitting: Family Medicine

## 2020-07-21 MED ORDER — ROSUVASTATIN CALCIUM 5 MG PO TABS: 2.5000 mg | ORAL_TABLET | Freq: Every day | ORAL | 3 refills | Status: DC

## 2020-07-26 DIAGNOSIS — Z0100 Encounter for examination of eyes and vision without abnormal findings: Secondary | ICD-10-CM | POA: Diagnosis not present

## 2020-07-26 DIAGNOSIS — H2513 Age-related nuclear cataract, bilateral: Secondary | ICD-10-CM | POA: Diagnosis not present

## 2020-08-17 ENCOUNTER — Telehealth: Payer: Self-pay | Admitting: Pharmacist

## 2020-08-17 NOTE — Chronic Care Management (AMB) (Signed)
Chronic Care Management Pharmacy Assistant   Name: Dawn Reyes  MRN: [id number] DOB: 04-13-1950  Reason for Encounter: Disease State Hypertension Assessment Call   Conditions to be addressed/monitored: HTN  Recent office visits:  07-16-20 Caren Macadam, MD - Patient presented for hypertension and other concerns. Prescribed Alprazolam 0.125-0.25 mg PRN, Stopped Red Yeast Rice extract.  Recent consult visits:  07-08-2020 [facility] - Patient presented for Breast cancer screening  Hospital visits:  None in previous 6 months  Medications: Outpatient Encounter Medications as of 08/17/2020  Medication Sig   ALPRAZolam (XANAX) 0.25 MG tablet Take 0.5-1 tablets (0.125-0.25 mg total) by mouth 2 (two) times daily as needed for anxiety.   CALCIUM PO Take 600 mg by mouth daily.   Cholecalciferol (VITAMIN D3) 2000 UNITS TABS Take 1 tablet by mouth daily.   gabapentin (NEURONTIN) 300 MG capsule Take 3 capsules (900 mg total) by mouth 2 (two) times daily.   hydrochlorothiazide (MICROZIDE) 12.5 MG capsule TAKE 1 CAPSULE EVERY DAY   Krill Oil 300 MG CAPS Take 1 capsule by mouth daily.   Multiple Vitamin (MULTIVITAMIN) tablet Take 1 tablet by mouth daily.   potassium chloride (KLOR-CON) 10 MEQ tablet TAKE 1 TABLET EVERY DAY   rosuvastatin (CRESTOR) 5 MG tablet Take 0.5 tablets (2.5 mg total) by mouth daily.   zoledronic acid (RECLAST) 5 MG/100ML SOLN injection Inject 100 mLs (5 mg total) into the vein once.   No facility-administered encounter medications on file as of 08/17/2020.  Reviewed chart prior to disease state call. Spoke with patient regarding BP  Recent Office Vitals: BP Readings from Last 3 Encounters:  07/16/20 128/70  06/24/20 136/72  05/07/20 136/82   Pulse Readings from Last 3 Encounters:  07/16/20 67  05/07/20 64  03/01/20 72    Wt Readings from Last 3 Encounters:  07/16/20 133 lb 11.2 oz (60.6 kg)  05/07/20 145 lb (65.8 kg)  03/01/20 150 lb 8 oz (68.3  kg)     Kidney Function Lab Results  Component Value Date/Time   CREATININE 0.72 07/16/2020 09:27 AM   CREATININE 0.76 05/04/2020 08:39 AM   GFR 84.49 07/16/2020 09:27 AM    BMP Latest Ref Rng & Units 07/16/2020 05/04/2020 01/05/2020  Glucose 70 - 99 mg/dL 86 85 92  BUN 6 - 23 mg/dL '10 12 9  '$ Creatinine 0.40 - 1.20 mg/dL 0.72 0.76 0.75  Sodium 135 - 145 mEq/L 140 135 139  Potassium 3.5 - 5.1 mEq/L 4.2 3.7 3.7  Chloride 96 - 112 mEq/L 100 97 101  CO2 19 - 32 mEq/L '31 31 31  '$ Calcium 8.4 - 10.5 mg/dL 9.4 9.3 9.4    Current antihypertensive regimen:  Hydrochlorothiazide 12.5 mg 1 capsule daily Potassium ER 10 mEq 1 tablet daily How often are you checking your Blood Pressure? Patient reports she does not check regularly has been so busy she can not recall when she last checked it, she reports she normally ranges around 130/80 Current home BP readings: Most recent office reading was 128/70 in July What recent interventions/DTPs have been made by any provider to improve Blood Pressure control since last CPP Visit: Patient reports none Any recent hospitalizations or ED visits since last visit with CPP? No What diet changes have been made to improve Blood Pressure Control?  Patient reports she eats pretty good. For breakfast will have oatmeal with fruit. For lunch she will have a Banana and peanut butter and or a salad. For dinner she will  have a meat like chicken, a potato or rice and some type of green vegetable or a salad. She is drinking plenty of water. What exercise is being done to improve your Blood Pressure Control?  Patent reports most mornings she is walking 2 to five miles  Adherence Review: Is the patient currently on ACE/ARB medication? No Does the patient have >5 day gap between last estimated fill dates? No  Patient confirmed she is taking Hydrochlorothiazide 12.5 mg 1 capsule daily and Potassium ER 10 mEq 1 tablet daily. She reports she is current with her fills and does not  need any additional medications at this time. She reports no additional issues or concerns.  Care Gaps: COVID Vaccines - Overdue Zoster Vaccine - Overdue Flu Vaccine - Overdue AWV - Done 05-11-20 CCM F/U Call - Scheduled 12-21-20 12p  Star Rating Drugs: Rosuvastatin (Crestor) 5 mg - Last filled 07-21-20 90 DS   Ned Clines [place] Clinical Pharmacist Assistant [phone]

## 2020-09-24 ENCOUNTER — Other Ambulatory Visit: Payer: Self-pay

## 2020-09-24 ENCOUNTER — Ambulatory Visit (INDEPENDENT_AMBULATORY_CARE_PROVIDER_SITE_OTHER): Payer: Medicare HMO | Admitting: Family Medicine

## 2020-09-24 ENCOUNTER — Ambulatory Visit (INDEPENDENT_AMBULATORY_CARE_PROVIDER_SITE_OTHER)
Admission: RE | Admit: 2020-09-24 | Discharge: 2020-09-24 | Disposition: A | Discharge status: Home / Self Care | Payer: Medicare HMO | Source: Ambulatory Visit | Attending: Family Medicine | Admitting: Family Medicine

## 2020-09-24 ENCOUNTER — Encounter: Payer: Self-pay | Admitting: Family Medicine

## 2020-09-24 VITALS — BP 138/82 | HR 68 | Temp 98.7°F | Ht 62.0 in | Wt 128.8 lb

## 2020-09-24 DIAGNOSIS — M79671 Pain in right foot: Secondary | ICD-10-CM | POA: Diagnosis not present

## 2020-09-24 DIAGNOSIS — M79672 Pain in left foot: Secondary | ICD-10-CM | POA: Diagnosis not present

## 2020-09-24 DIAGNOSIS — M7731 Calcaneal spur, right foot: Secondary | ICD-10-CM | POA: Diagnosis not present

## 2020-09-24 NOTE — Progress Notes (Signed)
  Dawn Reyes DOB: 1950/01/31 Encounter date: 09/24/2020  This is a 70 y.o. female who presents with Chief Complaint  Patient presents with   Foot Pain    Patient complains of right lateral foot pain x1 week, states she fell one month ago, does not recall injury to the foot    History of present illness: Not sure fall is related since pain didn't start right away. She fell forward carrying a large scarecrow she had made outside - hit head and left elbow on arm. Hurts ribs; went away in 2 weeks time.   Thinks that she tripped over drain spout outside. Did cut foot under medial ball of foot.   Worst pain with standing. At beginning of day not hurting if not putting pressure on foot but by end of day there is pain regardless of activity.  Allergies  Allergen Reactions   Vicodin [Hydrocodone-Acetaminophen] Itching   Current Meds  Medication Sig   ALPRAZolam (XANAX) 0.25 MG tablet Take 0.5-1 tablets (0.125-0.25 mg total) by mouth 2 (two) times daily as needed for anxiety.   CALCIUM PO Take 600 mg by mouth daily.   Cholecalciferol (VITAMIN D3) 2000 UNITS TABS Take 1 tablet by mouth daily.   gabapentin (NEURONTIN) 300 MG capsule Take 3 capsules (900 mg total) by mouth 2 (two) times daily.   hydrochlorothiazide (MICROZIDE) 12.5 MG capsule TAKE 1 CAPSULE EVERY DAY   Krill Oil 300 MG CAPS Take 1 capsule by mouth daily.   Multiple Vitamin (MULTIVITAMIN) tablet Take 1 tablet by mouth daily.   potassium chloride (KLOR-CON) 10 MEQ tablet TAKE 1 TABLET EVERY DAY   rosuvastatin (CRESTOR) 5 MG tablet Take 0.5 tablets (2.5 mg total) by mouth daily.   zoledronic acid (RECLAST) 5 MG/100ML SOLN injection Inject 100 mLs (5 mg total) into the vein once.    Review of Systems  Constitutional:  Negative for chills, fatigue and fever.  Respiratory:  Negative for cough, chest tightness, shortness of breath and wheezing.   Cardiovascular:  Negative for chest pain, palpitations and leg swelling.   Musculoskeletal:        Right foot pain; lateral. No known injury to lateral foot. Started 2 weeks after fall and injury to medial foot (toe). No swelling, minimal redness.    Objective:  BP 138/82 (BP Location: Left Arm, Patient Position: Sitting, Cuff Size: Normal)   Pulse 68   Temp 98.7 F (37.1 C) (Oral)   Ht '5\' 2"'$  (1.575 m)   Wt 128 lb 12.8 oz (58.4 kg)   SpO2 97%   BMI 23.56 kg/m   Weight: 128 lb 12.8 oz (58.4 kg)   BP Readings from Last 3 Encounters:  09/24/20 138/82  07/16/20 128/70  06/24/20 136/72   Wt Readings from Last 3 Encounters:  09/24/20 128 lb 12.8 oz (58.4 kg)  07/16/20 133 lb 11.2 oz (60.6 kg)  05/07/20 145 lb (65.8 kg)    Physical Exam Constitutional:      Appearance: Normal appearance.  Musculoskeletal:     Comments: Tenderness with palpation dorsal lateral 4th and some of 5th metatarsal. No edema appreciated. No limited ROM. Ankle is stable. No bruising. Very minimal erythema lateral foot (extremely subtle) with very mild warmth.   Neurological:     Mental Status: She is alert.    Assessment/Plan  1. Foot pain, right Will start with xray. Further eval pending that result. - DG Foot Complete Right; Future        Micheline Rough, MD

## 2020-09-27 ENCOUNTER — Other Ambulatory Visit: Payer: Self-pay

## 2020-09-27 ENCOUNTER — Encounter (HOSPITAL_COMMUNITY): Payer: Self-pay

## 2020-09-27 ENCOUNTER — Ambulatory Visit (HOSPITAL_COMMUNITY)
Admission: EM | Admit: 2020-09-27 | Discharge: 2020-09-27 | Disposition: A | Discharge status: Home / Self Care | Payer: Medicare HMO | Attending: Emergency Medicine | Admitting: Emergency Medicine

## 2020-09-27 ENCOUNTER — Ambulatory Visit (INDEPENDENT_AMBULATORY_CARE_PROVIDER_SITE_OTHER): Payer: Medicare HMO

## 2020-09-27 DIAGNOSIS — M79671 Pain in right foot: Secondary | ICD-10-CM

## 2020-09-27 DIAGNOSIS — S92354A Nondisplaced fracture of fifth metatarsal bone, right foot, initial encounter for closed fracture: Secondary | ICD-10-CM

## 2020-09-27 DIAGNOSIS — S92351A Displaced fracture of fifth metatarsal bone, right foot, initial encounter for closed fracture: Secondary | ICD-10-CM | POA: Diagnosis not present

## 2020-09-27 DIAGNOSIS — W19XXXA Unspecified fall, initial encounter: Secondary | ICD-10-CM

## 2020-09-27 MED ORDER — NAPROXEN 500 MG PO TABS: 500.0000 mg | ORAL_TABLET | Freq: Two times a day (BID) | ORAL | 0 refills | Status: DC

## 2020-09-27 NOTE — Discharge Instructions (Addendum)
Your x-ray today showed a fracture ( break in bone) of base of your fifth toe  You may use naproxen twice a needed for comfort   A boot has been applied. this is used to protect your injury and prevent further damage. Leave in place until seen by orthopedic specialist. Do not put objects into splint to scratch skin. If numbness or tingling occurs after placement please return to Urgent Care for evaluation.   Follow up with orthopedic specialist in 1-2 weeks. Call practice to make appointment. Information listed below. You may go to any orthopedic provider you deem fit.  Please do not put weight on fracture.

## 2020-09-27 NOTE — ED Provider Notes (Signed)
[place]    CSN: [id number] Arrival date & time: 09/27/20  0901      History   Chief Complaint No chief complaint on file.   HPI Dawn Reyes is a 69 y.o. female.   Patient presents with right foot pain and swelling worsening yesterday after fall.  Was walking down steps when she heard a cracking/popping sound and had a sudden rush of pain.  Patient sat down immediately and then fainted.  Unable to bear weight.  Range of motion limited due to pain.  Had injury to right foot 3 weeks ago because of pain and swelling, evaluated on 09/24/2020, x-ray negative.  Has not attempted treatment of new symptoms.  Past Medical History:  Diagnosis Date   Anxiety    Benign hypertension    Burning tongue syndrome 02/06/2013   Candidiasis of mouth    Depression and Anxiety - 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 07/16/2012   Fibromyalgia    GERD (gastroesophageal reflux disease)    Hematochezia 02/13/2012   Left sided abdominal pain 02/13/2012   Osteoporosis, unspecified 07/30/2012    Patient Active Problem List   Diagnosis Date Noted   Dyspnea 05/03/2016   Burning tongue syndrome 02/06/2013   Osteoporosis 07/30/2012   Essential hypertension, benign 07/16/2012   Neuralgia 07/16/2012    Past Surgical History:  Procedure Laterality Date   ABDOMINAL HYSTERECTOMY     no concern for cancer; menorrhagia   BREAST EXCISIONAL BIOPSY Right 11/14/2002   Radial Scar   FOOT SURGERY Bilateral    mortons neuroma removal   TONSILLECTOMY      OB History   No obstetric history on file.      Home Medications    Prior to Admission medications   Medication Sig Start Date End Date Taking? Authorizing Provider  ALPRAZolam (XANAX) 0.25 MG tablet Take 0.5-1 tablets (0.125-0.25 mg total) by mouth 2 (two) times daily as needed for anxiety. 07/16/20   Caren Macadam, MD  CALCIUM PO Take 600 mg by mouth daily.    [provider]  Cholecalciferol (VITAMIN D3) 2000 UNITS TABS Take 1  tablet by mouth daily.    [provider]  gabapentin (NEURONTIN) 300 MG capsule Take 3 capsules (900 mg total) by mouth 2 (two) times daily. 01/05/20   Tomi Likens, Adam R, DO  hydrochlorothiazide (MICROZIDE) 12.5 MG capsule TAKE 1 CAPSULE EVERY DAY 06/07/20   Koberlein, Steele Berg, MD  Krill Oil 300 MG CAPS Take 1 capsule by mouth daily.    [provider]  Multiple Vitamin (MULTIVITAMIN) tablet Take 1 tablet by mouth daily.    [provider]  potassium chloride (KLOR-CON) 10 MEQ tablet TAKE 1 TABLET EVERY DAY 06/07/20   Koberlein, Andris Flurry C, MD  rosuvastatin (CRESTOR) 5 MG tablet Take 0.5 tablets (2.5 mg total) by mouth daily. 07/21/20   Caren Macadam, MD  zoledronic acid (RECLAST) 5 MG/100ML SOLN injection Inject 100 mLs (5 mg total) into the vein once. 08/08/13   Elayne Snare, MD    Family History Family History  Problem Relation Age of Onset   Hypertension Father    Lung cancer Father    Liver cancer Father    Epilepsy Father    Lung cancer Mother    Arthritis Mother    Hypertension Sister    Osteoporosis Sister    Hypertension Brother    Arthritis Brother    Lung cancer Brother        small cell  Colon cancer Son 31   Diabetes Neg Hx     Social History Social History   Tobacco Use   Smoking status: Never   Smokeless tobacco: Never  Vaping Use   Vaping Use: Never used  Substance Use Topics   Alcohol use: No    Alcohol/week: 0.0 standard drinks   Drug use: No     Allergies   Vicodin [hydrocodone-acetaminophen]   Review of Systems Review of Systems  Constitutional: Negative.   Respiratory: Negative.    Cardiovascular: Negative.   Musculoskeletal:  Positive for joint swelling. Negative for arthralgias, back pain, gait problem, myalgias, neck pain and neck stiffness.  Skin: Negative.   Neurological: Negative.     Physical Exam Triage Vital Signs ED Triage Vitals  Enc Vitals Group     BP 09/27/20 1102 (!) 153/88     Pulse Rate  09/27/20 1102 65     Resp 09/27/20 1102 16     Temp 09/27/20 1102 98 F (36.7 C)     Temp src --      SpO2 09/27/20 1102 96 %     Weight --      Height --      Head Circumference --      Peak Flow --      Pain Score 09/27/20 1101 3     Pain Loc --      Pain Edu? --      Excl. in [place]? --    No data found.  Updated Vital Signs BP (!) 153/88   Pulse 65   Temp 98 F (36.7 C)   Resp 16   SpO2 96%   Visual Acuity Right Eye Distance:   Left Eye Distance:   Bilateral Distance:    Right Eye Near:   Left Eye Near:    Bilateral Near:     Physical Exam Constitutional:      Appearance: Normal appearance. She is normal weight.  HENT:     Head: Normocephalic.  Eyes:     Extraocular Movements: Extraocular movements intact.  Cardiovascular:     Pulses: Normal pulses.  Pulmonary:     Effort: Pulmonary effort is normal.  Feet:     Comments: Point tenderness at the base of the fifth metatarsal extending down into the midfoot, diffuse tenderness throughout the lateral aspect of the right ankle, minimal effort given to range of motion due to pain, 2+ dorsalis pedis and pedal pulse, capillary refill less than 3 on all toes, sensation intact, no ecchymosis noted Skin:    General: Skin is warm and dry.  Neurological:     Mental Status: She is alert and oriented to person, place, and time. Mental status is at baseline.  Psychiatric:        Mood and Affect: Mood normal.        Behavior: Behavior normal.     UC Treatments / Results  Labs (all labs ordered are listed, but only abnormal results are displayed) Labs Reviewed - No data to display  EKG   Radiology No results found.  Procedures Procedures (including critical care time)  Medications Ordered in UC Medications - No data to display  Initial Impression / Assessment and Plan / UC Course  I have reviewed the triage vital signs and the nursing notes.  Pertinent labs & imaging results that were available during my  care of the patient were reviewed by me and considered in my medical decision making (see chart for details).  Closed  fracture of the base of the fifth metatarsal on the right foot  1.  Fracture confirmed by x-ray 2.  Cam boot applied, neurovascular intact prior to and after placement, nonweightbearing 3.  Naproxen 500 mg twice daily as needed 4.  Orthopedic follow-up in the next 1 to 2 weeks for further evaluation Final Clinical Impressions(s) / UC Diagnoses   Final diagnoses:  None   Discharge Instructions   None    ED Prescriptions   None    PDMP not reviewed this encounter.   Hans Eden, NP 09/27/20 1205

## 2020-09-27 NOTE — ED Triage Notes (Signed)
Pt presents with pain and swelling on the right foot x 1 week. Pain is worser when walking.  Pt reports she fell and passed out. Pt reports she was with her daughter when she passed out and did not hit her head.

## 2020-09-29 DIAGNOSIS — S92354A Nondisplaced fracture of fifth metatarsal bone, right foot, initial encounter for closed fracture: Secondary | ICD-10-CM | POA: Diagnosis not present

## 2020-10-01 ENCOUNTER — Encounter: Payer: Self-pay | Admitting: Family Medicine

## 2020-10-01 NOTE — Progress Notes (Signed)
Noted. I sent patient message.

## 2020-10-06 DIAGNOSIS — S92354D Nondisplaced fracture of fifth metatarsal bone, right foot, subsequent encounter for fracture with routine healing: Secondary | ICD-10-CM | POA: Diagnosis not present

## 2020-10-12 ENCOUNTER — Telehealth: Payer: Self-pay | Admitting: Pharmacist

## 2020-10-12 NOTE — Chronic Care Management (AMB) (Signed)
Chronic Care Management Pharmacy Assistant   Name: Dawn Reyes  MRN: [id number] DOB: June 24, 1950   Reason for Encounter: Disease State Cholesterol Assessment Call   Conditions to be addressed/monitored: HLD  Recent office visits:  09/24/2020 Caren Macadam, MD - Patient presented for right foot pain. NO medication changes.  X-RAY ordered.  Recent consult visits:  None   Hospital visits:  Medication Reconciliation was completed by comparing discharge summary, patient's EMR and Pharmacy list, and upon discussion with patient.  Patient presented to [facility] on 09/27/2020  due to Fracture of base of fifth metatarsal bone. She was present for 1 hour.  New?Medications Started at [facility] Discharge:?? -started  naproxen 500 MG tablet Take 1 tablet (500 mg total) by mouth 2 (two) times daily  Medication Changes at Hospital Discharge: -Changed  None   Medications Discontinued at Hospital Discharge: -Stopped  None   Medications that remain the same after Hospital Discharge:??  -All other medications will remain the same.    Medications: Outpatient Encounter Medications as of 10/12/2020  Medication Sig   ALPRAZolam (XANAX) 0.25 MG tablet Take 0.5-1 tablets (0.125-0.25 mg total) by mouth 2 (two) times daily as needed for anxiety.   CALCIUM PO Take 600 mg by mouth daily.   Cholecalciferol (VITAMIN D3) 2000 UNITS TABS Take 1 tablet by mouth daily.   gabapentin (NEURONTIN) 300 MG capsule Take 3 capsules (900 mg total) by mouth 2 (two) times daily.   hydrochlorothiazide (MICROZIDE) 12.5 MG capsule TAKE 1 CAPSULE EVERY DAY   Krill Oil 300 MG CAPS Take 1 capsule by mouth daily.   Multiple Vitamin (MULTIVITAMIN) tablet Take 1 tablet by mouth daily.   naproxen (NAPROSYN) 500 MG tablet Take 1 tablet (500 mg total) by mouth 2 (two) times daily.   potassium chloride (KLOR-CON) 10 MEQ tablet TAKE 1 TABLET EVERY DAY   rosuvastatin (CRESTOR) 5 MG tablet  Take 0.5 tablets (2.5 mg total) by mouth daily.   zoledronic acid (RECLAST) 5 MG/100ML SOLN injection Inject 100 mLs (5 mg total) into the vein once.   No facility-administered encounter medications on file as of 10/12/2020.    10/12/2020 Name: Dawn Reyes MRN: [id number] DOB: [birth year] [name] is a 70 y.o. year old female who is a primary care patient of Caren Macadam, MD.  Comprehensive medication review performed; Spoke to patient regarding cholesterol  Lipid Panel    Component Value Date/Time   CHOL 184 07/16/2020 0927   TRIG 78.0 07/16/2020 0927   HDL 64.80 07/16/2020 0927   LDLCALC 103 (H) 07/16/2020 0927   LDLDIRECT 131.7 12/20/2012 0906    10-year ASCVD risk score: The 10-year ASCVD risk score (Arnett DK, et al., 2019) is: 18.6%   Values used to calculate the score:     Age: 70 years     Sex: Female     Is Non-Hispanic African American: No     Diabetic: No     Tobacco smoker: No     Systolic Blood Pressure: 366 mmHg     Is BP treated: Yes     HDL Cholesterol: 64.8 mg/dL     Total Cholesterol: 184 mg/dL  Current antihyperlipidemic regimen:  Rosuvastatin (Crestor) 5 mg - take half tablet  (2.5) mg daily  Previous antihyperlipidemic medications tried:  Red yeast rice 600 mg daily - been on for 4 years Krill oil 500 mg 1 capsule daily ASCVD risk enhancing conditions: age >43 and HTN What  recent interventions/DTPs have been made by any provider to improve Cholesterol control since last CPP Visit: Started Rosuvastatin 2.5 mg daily. Any recent hospitalizations or ED visits since last visit with CPP? Yes What diet changes have been made to improve Cholesterol?  Patient reports she is still doing good with her meals and appetite does watch her sodium intake as well What exercise is being done to improve Cholesterol?  Patient reports she is still walking almost daily Notes: Patient reports no side effects from any medication no issues or concerns at this time she  reports that she is doing well. She is aware of upcoming appointments with PCP as well as Clinical Pharmacist  Adherence Review: Does the patient have >5 day gap between last estimated fill dates? No   Care Gaps: COVID Vaccines - Overdue AWV - Done 05/11/20 CCM F/U Call - Scheduled 12/21/20 12p BP- 138/82  Star Rating Drugs: Rosuvastatin (Crestor) 5 mg - Last filled 07/21/20 90 DS at [facility] (taking one half will last till 01/2021)   [place] Pharmacist Assistant [phone]

## 2020-11-03 DIAGNOSIS — S92354D Nondisplaced fracture of fifth metatarsal bone, right foot, subsequent encounter for fracture with routine healing: Secondary | ICD-10-CM | POA: Diagnosis not present

## 2020-12-06 DIAGNOSIS — S92354D Nondisplaced fracture of fifth metatarsal bone, right foot, subsequent encounter for fracture with routine healing: Secondary | ICD-10-CM | POA: Diagnosis not present

## 2020-12-15 NOTE — Progress Notes (Signed)
NEUROLOGY FOLLOW UP OFFICE NOTE  CATHALINA BARCIA [id number]  Assessment/Plan:   Restless Leg Syndrome Burning Mouth Syndrome Hypertension   Gabapentin 900mg  BID Follow up one year   Subjective:  Sage Hammill is a 70 year old woman with hypertension, osteoarthritis, depression and fibromyalgia who follows up for restless leg syndrome and burning mouth syndrome.    I  RESTLESS LEG SYNDROME: Update: Current medication:  gabapentin 900mg  in afternoon and 900mg  at bedtime. Stable at this time.   History: Onset of symptoms occurred in 2013. The symptoms correlate soon after the dental procedure involving 2 canals of 2 of her right-sided upper teeth. She did see the dentist for this, who said that her symptoms are unlikely related.  She notes a "discomfort "on the left side of her face, involving the maxilla to the temple. It is not really a pain. However, she will occasionally have a brief shooting sensation, about a 5/10.  It is sensitive to the touch.  It appears to be sensitive when she is putting on her makeup. She is never in extreme pain however. She also has a burning sensation of her tongue. This also is a 5/10 in intensity. It is less painful when she is eating or drinking.  Lidocaine mouth rinse and ice provide brief relief   II BURNING MOUTH SYNDROME: Update: Current medication:  gabapentin 900mg  in afternoon and 900mg  at bedtime She is doing well. Sometimes experiences the burning from time to time but does not affect quality of life.     History: She has had it for several years.  It occurs in the evening and in bed.  When she is in bed or sitting still watching TV, she would experience discomfort in the legs that is relieved by movement.  PAST MEDICAL HISTORY: Past Medical History:  Diagnosis Date   Anxiety    Benign hypertension    Burning tongue syndrome 02/06/2013   Candidiasis of mouth    Depression and Anxiety - 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 07/16/2012    Fibromyalgia    GERD (gastroesophageal reflux disease)    Hematochezia 02/13/2012   Left sided abdominal pain 02/13/2012   Osteoporosis, unspecified 07/30/2012    MEDICATIONS: Current Outpatient Medications on File Prior to Visit  Medication Sig Dispense Refill   ALPRAZolam (XANAX) 0.25 MG tablet Take 0.5-1 tablets (0.125-0.25 mg total) by mouth 2 (two) times daily as needed for anxiety. 20 tablet 0   CALCIUM PO Take 600 mg by mouth daily.     Cholecalciferol (VITAMIN D3) 2000 UNITS TABS Take 1 tablet by mouth daily.     gabapentin (NEURONTIN) 300 MG capsule Take 3 capsules (900 mg total) by mouth 2 (two) times daily. 540 capsule 0   hydrochlorothiazide (MICROZIDE) 12.5 MG capsule TAKE 1 CAPSULE EVERY DAY 90 capsule 1   Krill Oil 300 MG CAPS Take 1 capsule by mouth daily.     Multiple Vitamin (MULTIVITAMIN) tablet Take 1 tablet by mouth daily.     naproxen (NAPROSYN) 500 MG tablet Take 1 tablet (500 mg total) by mouth 2 (two) times daily. 30 tablet 0   potassium chloride (KLOR-CON) 10 MEQ tablet TAKE 1 TABLET EVERY DAY 90 tablet 1   rosuvastatin (CRESTOR) 5 MG tablet Take 0.5 tablets (2.5 mg total) by mouth daily. 45 tablet 3   zoledronic acid (RECLAST) 5 MG/100ML SOLN injection Inject 100 mLs (5 mg total) into the vein once. 100 mL 0   No current facility-administered medications on  file prior to visit.    ALLERGIES: Allergies  Allergen Reactions   Vicodin [Hydrocodone-Acetaminophen] Itching    FAMILY HISTORY: Family History  Problem Relation Age of Onset   Hypertension Father    Lung cancer Father    Liver cancer Father    Epilepsy Father    Lung cancer Mother    Arthritis Mother    Hypertension Sister    Osteoporosis Sister    Hypertension Brother    Arthritis Brother    Lung cancer Brother        small cell   Colon cancer Son 51   Diabetes Neg Hx       Objective:  Blood pressure (!) 157/70, pulse 78, height 5\' 2"  (1.575 m), weight 134 lb 6.4 oz (61 kg), SpO2 97  %. General: No acute distress.  Patient appears well-groomed.   Head:  Normocephalic/atraumatic Eyes:  Fundi examined but not visualized Neck: supple, no paraspinal tenderness, full range of motion Heart:  Regular rate and rhythm Lungs:  Clear to auscultation bilaterally Back: No paraspinal tenderness Neurological Exam: alert and oriented to person, place, and time.  Speech fluent and not dysarthric, language intact.  CN II-XII intact. Bulk and tone normal, muscle strength 5/5 throughout.  Sensation to light touch, temperature and vibration intact.  Deep tendon reflexes 2+ throughout, toes downgoing.  Finger to nose testing intact.  Gait normal, Romberg negative.   Metta Clines, DO  CC: Micheline Rough, MD

## 2020-12-16 ENCOUNTER — Other Ambulatory Visit: Payer: Self-pay

## 2020-12-16 ENCOUNTER — Ambulatory Visit: Payer: Medicare HMO | Admitting: Neurology

## 2020-12-16 ENCOUNTER — Encounter: Payer: Self-pay | Admitting: Neurology

## 2020-12-16 VITALS — BP 157/70 | HR 78 | Ht 62.0 in | Wt 134.4 lb

## 2020-12-16 DIAGNOSIS — G2581 Restless legs syndrome: Secondary | ICD-10-CM

## 2020-12-16 DIAGNOSIS — K146 Glossodynia: Secondary | ICD-10-CM

## 2020-12-16 NOTE — Patient Instructions (Signed)
Gabapentin 900mg  three times daily Follow up 1 year

## 2020-12-20 ENCOUNTER — Telehealth: Payer: Self-pay | Admitting: Pharmacist

## 2020-12-20 NOTE — Chronic Care Management (AMB) (Signed)
    Chronic Care Management Pharmacy Assistant   Name: Dawn Reyes  MRN: [id number] DOB: September 23, 1950  12/20/20 APPOINTMENT REMINDER    Patient was reminded to have all medications, supplements and any blood glucose and blood pressure readings available for review with [name], Pharm. D, for telephone visit on 12/21/20 at 12.     Care Gaps: COVID Vaccines - Overdue AWV - Done 05/11/20 CCM F/U Call - Scheduled 12/21/20 12p BP- 138/82 (09/24/20)  Star Rating Drug: Rosuvastatin (Crestor) 5 mg - Last filled 10/05/20 90 DS at [place]   Any gaps in medications fill history?  None    Medications: Outpatient Encounter Medications as of 12/20/2020  Medication Sig   CALCIUM PO Take 600 mg by mouth daily.   Cholecalciferol (VITAMIN D3) 2000 UNITS TABS Take 1 tablet by mouth daily.   gabapentin (NEURONTIN) 300 MG capsule Take 3 capsules (900 mg total) by mouth 2 (two) times daily.   hydrochlorothiazide (MICROZIDE) 12.5 MG capsule TAKE 1 CAPSULE EVERY DAY   Multiple Vitamin (MULTIVITAMIN) tablet Take 1 tablet by mouth daily.   potassium chloride (KLOR-CON) 10 MEQ tablet TAKE 1 TABLET EVERY DAY   rosuvastatin (CRESTOR) 5 MG tablet Take 0.5 tablets (2.5 mg total) by mouth daily.   zoledronic acid (RECLAST) 5 MG/100ML SOLN injection Inject 100 mLs (5 mg total) into the vein once.   No facility-administered encounter medications on file as of 12/20/2020.       [facility] Pharmacist Assistant [phone]

## 2020-12-21 ENCOUNTER — Ambulatory Visit (INDEPENDENT_AMBULATORY_CARE_PROVIDER_SITE_OTHER): Payer: Medicare HMO | Admitting: Pharmacist

## 2020-12-21 DIAGNOSIS — I10 Essential (primary) hypertension: Secondary | ICD-10-CM

## 2020-12-21 DIAGNOSIS — E785 Hyperlipidemia, unspecified: Secondary | ICD-10-CM

## 2020-12-21 NOTE — Patient Instructions (Signed)
Hi Dawn Reyes,  It was great to catch up again! Keep on checking your blood pressure and make sure to check in the evening as well to make sure it's not significantly higher than the morning readings.  Please reach out to me if you have any questions or need anything before our follow up!  Best, Maddie  [name], PharmD, [place] Pharmacist [facility]   Visit Information   Goals Addressed   None    Patient Care Plan: CCM Pharmacy Care Plan     Problem Identified: Problem: Hypertension, Hyperlipidemia, Osteoporosis, and Pain      Long-Range Goal: Patient-Specific Goal   Start Date: 06/24/2020  Expected End Date: 06/24/2021  Recent Progress: On track  Priority: High  Note:   Current Barriers:  Unable to independently monitor therapeutic efficacy Unable to achieve control of cholesterol  Unable to maintain control of blood pressure  Pharmacist Clinical Goal(s):  Patient will achieve adherence to monitoring guidelines and medication adherence to achieve therapeutic efficacy achieve control of cholesterol as evidenced by next lipid panel adhere to plan to optimize therapeutic regimen for cholesterol as evidenced by report of adherence to recommended medication management changes through collaboration with PharmD and provider.   Interventions: 1:1 collaboration with Caren Macadam, MD regarding development and update of comprehensive plan of care as evidenced by provider attestation and co-signature Inter-disciplinary care team collaboration (see longitudinal plan of care) Comprehensive medication review performed; medication list updated in electronic medical record  Hypertension (BP goal <140/90) -Controlled -Current treatment: Hydrochlorothiazide 12.5 mg 1 capsule daily Potassium ER 10 mEq 1 tablet daily -Medications previously tried: none  -Current home readings: 134/84, 124/74, 119/72, 126/75, 150/76, 152/89, 146/85,  156/80, 175/80 (checking in the evening) -Current dietary habits: cooks with salt but does limit some; reads package labels for sodium -Current exercise habits: walking daily -Denies hypotensive/hypertensive symptoms -Educated on Daily salt intake goal < 2300 mg; Exercise goal of 150 minutes per week; Importance of home blood pressure monitoring; Proper BP monitoring technique; -Counseled to monitor BP at home weekly, document, and provide log at future appointments -Counseled on diet and exercise extensively Recommended to continue current medication  Hyperlipidemia: (LDL goal < 100) -Uncontrolled -Current treatment: Rosuvastatin 1/2 tablet daily Krill oil 500 mg 1 capsule daily -Medications previously tried: red yeast rice -Current dietary patterns: does not eat out much and tries to limit fried foods -Current exercise habits: walking daily -Educated on Cholesterol goals;  Benefits of statin for ASCVD risk reduction; Importance of limiting foods high in cholesterol; Exercise goal of 150 minutes per week; -Counseled on diet and exercise extensively Recommended to continue current medication Recommended repeat lipid panel at next office visit.  Osteoporosis (Goal prevent fractures) -Controlled -Last DEXA Scan: 05/2020   T-Score femoral neck: -2.3  T-Score total hip: n/a  T-Score lumbar spine: -0.4  T-Score forearm radius: n/a  10-year probability of major osteoporotic fracture: n/a  10-year probability of hip fracture: n/a -Patient is a candidate for pharmacologic treatment due to T-Score < -2.5 in femoral neck (previous DEXA) -Current treatment  Reclast 5 mg once yearly (7 years) - will be taking a holiday Calcium 600 mg-vitamin D 400 units Takes 2 every other day - recommended one a day Vitamin D 2000 units daily Multivitamin D (300 mg calcium)  -Medications previously tried: none  -Recommend [phone] units of vitamin D daily. Recommend 1200 mg of calcium daily from  dietary and supplemental sources. Recommend weight-bearing and muscle strengthening  exercises for building and maintaining bone density. -Counseled on diet and exercise extensively Recommended to continue current medication Counseled on drug holiday with Reclast  Restless legs syndrome/burning tongue (Goal: minimize symptoms) -Controlled -Current treatment  Gabapentin 300 mg 3 capsules twice daily -Medications previously tried: none  -Recommended to continue current medication  Health Maintenance -Vaccine gaps: Prevnar 20, COVID (declines) -Current therapy:  Multivitamin 1 tablet daily -Educated on Cost vs benefit of each product must be carefully weighed by individual consumer -Patient is satisfied with current therapy and denies issues -Recommended to continue current medication  Patient Goals/Self-Care Activities Patient will:  - take medications as prescribed focus on medication adherence by moving all medications to before eating check blood pressure weekly, document, and provide at future appointments target a minimum of 150 minutes of moderate intensity exercise weekly  Follow Up Plan: The care management team will reach out to the patient again over the next 30 days.  6 months       Patient verbalizes understanding of instructions provided today and agrees to view in [place].  The pharmacy team will reach out to the patient again over the next 14 days.   Viona Gilmore, [facility]

## 2020-12-21 NOTE — Progress Notes (Signed)
Chronic Care Management Pharmacy Note  12/21/2020 Name:  Dawn Reyes MRN:  [id number] DOB:  Jun 02, 1950  Summary: LDL above goal < 100 BP not at goal < 140/90 per home readings  Recommendations/Changes made from today's visit: -Recommend repeat lipid panel at next office visit -Recommended for patient to monitor BP at home regularly  Plan: Follow up BP assessment in 2 weeks  Subjective: Dawn Reyes is an 70 y.o. year old female who is a primary patient of Koberlein, Steele Berg, MD.  The CCM team was consulted for assistance with disease management and care coordination needs.    Engaged with patient by telephone for follow up visit in response to provider referral for pharmacy case management and/or care coordination services.   Consent to Services:  The patient was given information about Chronic Care Management services, agreed to services, and gave verbal consent prior to initiation of services.  Please see initial visit note for detailed documentation.   Patient Care Team: Caren Macadam, MD as PCP - General (Family Medicine) Paula Compton, MD as Attending Physician (Obstetrics and Gynecology) Irene Shipper, MD as Attending Physician (Gastroenterology) Haverstock, Jennefer Bravo, MD as Referring Physician (Dermatology) Viona Gilmore, [facility] as Pharmacist (Pharmacist)  Recent office visits: 09/24/2020 Caren Macadam, MD - Patient presented for right foot pain. NO medication changes. X-RAY ordered.  05/11/20 Randel Pigg, LPN: Patient presented for AWV.  Recent consult visits: 12/16/20 Metta Clines, DO (neurology): Patient presented for burning tongue syndrome. BP elevated. Continued gabapentin 900 mg TID.  11/03/20 Marchia Bond (ortho): Patient presented for foot fracture follow up. Unable to access notes.  05/07/20 Elayne Snare MD (Endocrinology) - presented to clinic for follow up on Osteoporosis. No medication changes. Follow up in 1 year. Patient on reclast for  yearly infusions for condition.  04/13/20 Margie Billet, PT (rehab): Patient presented for PT treatment for muscle weakness.   02/18/20 Darrick Meigs (Dermatology) - presented to clinic for Melanocytic nevi of trunk inflamed seborrheic keratosis. Destruction of up to 14 lesions performed in office. No medication changes or follow up noted.  Hospital visits: Medication Reconciliation was completed by comparing discharge summary, patient's EMR and Pharmacy list, and upon discussion with patient.   Patient presented to [facility] on 09/27/2020  due to Fracture of base of fifth metatarsal bone. She was present for 1 hour.   New?Medications Started at [facility] Discharge:?? -started  naproxen 500 MG tablet Take 1 tablet (500 mg total) by mouth 2 (two) times daily   Medication Changes at Hospital Discharge: -Changed  None    Medications Discontinued at Hospital Discharge: -Stopped  None    Medications that remain the same after Hospital Discharge:??  -All other medications will remain the same.       Objective:  Lab Results  Component Value Date   CREATININE 0.72 07/16/2020   BUN 10 07/16/2020   GFR 84.49 07/16/2020   NA 140 07/16/2020   K 4.2 07/16/2020   CALCIUM 9.4 07/16/2020   CO2 31 07/16/2020   GLUCOSE 86 07/16/2020    Lab Results  Component Value Date/Time   HGBA1C 5.6 01/05/2020 11:33 AM   HGBA1C 5.5 12/20/2018 10:57 AM   GFR 84.49 07/16/2020 09:27 AM   GFR 79.30 05/04/2020 08:39 AM    Last diabetic Eye exam: No results found for: HMDIABEYEEXA  Last diabetic Foot exam: No results found for: HMDIABFOOTEX   Lab Results  Component Value Date   CHOL  184 07/16/2020   HDL 64.80 07/16/2020   LDLCALC 103 (H) 07/16/2020   LDLDIRECT 131.7 12/20/2012   TRIG 78.0 07/16/2020   CHOLHDL 3 07/16/2020    Hepatic Function Latest Ref Rng & Units 07/16/2020 05/04/2020 01/05/2020  Total Protein 6.0 - 8.3 g/dL 6.7 6.7 6.6  Albumin 3.5 - 5.2 g/dL 4.6  4.2 4.5  AST 0 - 37 U/L _0 ALT 0 - 35 U/L _1 Alk Phosphatase 39 - 117 U/L 44 41 38(L)  Total Bilirubin 0.2 - 1.2 mg/dL 0.6 0.7 0.6  Bilirubin, Direct 0.0 - 0.3 mg/dL - - -    Lab Results  Component Value Date/Time   TSH 2.90 12/20/2018 10:57 AM   TSH 2.49 07/30/2012 08:41 AM   FREET4 0.75 07/30/2012 08:41 AM    CBC Latest Ref Rng & Units 07/16/2020 01/05/2020 12/20/2018  WBC 4.0 - 10.5 K/uL 5.6 5.0 5.1  Hemoglobin 12.0 - 15.0 g/dL 13.7 13.4 13.7  Hematocrit 36.0 - 46.0 % 40.3 39.7 41.2  Platelets 150.0 - 400.0 K/uL 329.0 322.0 358.0    Lab Results  Component Value Date/Time   VD25OH 48.49 05/04/2020 08:39 AM   VD25OH 47.23 04/28/2019 08:26 AM    Clinical ASCVD: No  The 10-year ASCVD risk score (Arnett DK, et al., 2019) is: 29.5%   Values used to calculate the score:     Age: 16 years     Sex: Female     Is Non-Hispanic African American: No     Diabetic: No     Tobacco smoker: Yes     Systolic Blood Pressure: 712 mmHg     Is BP treated: Yes     HDL Cholesterol: 64.8 mg/dL     Total Cholesterol: 184 mg/dL    Depression screen [facility] 2/9 05/11/2020 01/05/2020 05/03/2017  Decreased Interest 0 0 0  Down, Depressed, Hopeless 0 0 0  PHQ - 2 Score 0 0 0      Social History   Tobacco Use  Smoking Status Never  Smokeless Tobacco Never   BP Readings from Last 3 Encounters:  12/16/20 (!) 157/70  09/27/20 (!) 153/88  09/24/20 138/82   Pulse Readings from Last 3 Encounters:  12/16/20 78  09/27/20 65  09/24/20 68   Wt Readings from Last 3 Encounters:  12/16/20 134 lb 6.4 oz (61 kg)  09/24/20 128 lb 12.8 oz (58.4 kg)  07/16/20 133 lb 11.2 oz (60.6 kg)   BMI Readings from Last 3 Encounters:  12/16/20 24.58 kg/m  09/24/20 23.56 kg/m  07/16/20 24.45 kg/m    Assessment/Interventions: Review of patient past medical history, allergies, medications, health status, including review of consultants reports, laboratory and other test data, was performed as  part of comprehensive evaluation and provision of chronic care management services.   SDOH:  (Social Determinants of Health) assessments and interventions performed: Yes   SDOH Screenings   Alcohol Screen: Low Risk    Last Alcohol Screening Score (AUDIT): 0  Depression (PHQ2-9): Low Risk    PHQ-2 Score: 0  Financial Resource Strain: Low Risk    Difficulty of Paying Living Expenses: Not hard at all  Food Insecurity: No Food Insecurity   Worried About Charity fundraiser in the Last Year: Never true   Ran Out of Food in the Last Year: Never true  Housing: Low Risk    Last Housing Risk Score: 0  Physical Activity: Sufficiently Active   Days of Exercise per Week: 7  days   Minutes of Exercise per Session: 50 min  Social Connections: Moderately Isolated   Frequency of Communication with Friends and Family: More than three times a week   Frequency of Social Gatherings with Friends and Family: More than three times a week   Attends Religious Services: 1 to 4 times per year   Active Member of [employer] or Organizations: No   Attends Archivist Meetings: Never   Marital Status: Widowed  Stress: No Stress Concern Present   Feeling of Stress : Not at all  Tobacco Use: Low Risk    Smoking Tobacco Use: Never   Smokeless Tobacco Use: Never   Passive Exposure: Not on file  Transportation Needs: No Transportation Needs   Lack of Transportation (Medical): No   Lack of Transportation (Non-Medical): No   Patient is walking 2-5 miles a day almost every day first thing in the morning. Patient joined the [facility] and is seeing a trainer to go through different machines. Patient doesn't sit during the day and usually works in the yard or makes furniture in her shed. She also occasionally watches her great grandchildren who live in [place].  She does all of the cooking and cleaning. She does eat out occasionally. She usually cooks chicken, potato and salad, OR fish, potato and salad. She  recently join noom and lost 13 lbs. She has also been drinking smoothies as a meal, which include strawberries and bananas. For breakfast, she eats oatmeal with apples or eggs and toast. She does eat desserts every once in a while and loves sweets but tries to limit them. She drinks water and no sweet tea or soda or juice.  Her sleep varies every night and sometimes she feels tired. She gets about 5-7 hours of sleep per night and does not take naps. It takes her a while to shut off her mind before bed.  Patient denies any problems with medications and uses a weekly pill box. If she misses her medicines, she misses them all. Recommended moving everything to before eating to help her remember to take.  CCM Care Plan  Allergies  Allergen Reactions   Vicodin [Hydrocodone-Acetaminophen] Itching    Medications Reviewed Today     Reviewed by Viona Gilmore, [facility] (Pharmacist) on 12/21/20 at 1214  Med List Status: <None>   Medication Order Taking? Sig Documenting Provider Last Dose Status Informant  CALCIUM PO [id number] No Take 600 mg by mouth daily. [provider] Taking Active   Cholecalciferol (VITAMIN D3) 2000 UNITS TABS [id number] No Take 1 tablet by mouth daily. [provider] Taking Active Self           Med Note Constance Haw, PAMELA C   Sun Feb 16, 2013 11:01 AM)    gabapentin (NEURONTIN) 300 MG capsule [id number] No Take 3 capsules (900 mg total) by mouth 2 (two) times daily. Pieter Partridge, DO Taking Active   hydrochlorothiazide (MICROZIDE) 12.5 MG capsule [id number] No TAKE 1 CAPSULE EVERY DAY Koberlein, Junell C, MD Taking Active   Multiple Vitamin (MULTIVITAMIN) tablet [id number] No Take 1 tablet by mouth daily. [provider] Taking Active Self           Med Note Constance Haw, Joie Bimler Feb 16, 2013 11:02 AM)    potassium chloride (KLOR-CON) 10 MEQ tablet [id number] No TAKE 1 TABLET EVERY DAY Koberlein, Junell C, MD Taking Active   rosuvastatin (CRESTOR) 5 MG tablet  [id number] No Take 0.5 tablets (2.5 mg total)  by mouth daily. Caren Macadam, MD Taking Active   zoledronic acid (RECLAST) 5 MG/100ML SOLN injection [id number] No Inject 100 mLs (5 mg total) into the vein once. Elayne Snare, MD Taking Active             Patient Active Problem List   Diagnosis Date Noted   Dyspnea 05/03/2016   Burning tongue syndrome 02/06/2013   Osteoporosis 07/30/2012   Essential hypertension, benign 07/16/2012   Neuralgia 07/16/2012    Immunization History  Administered Date(s) Administered   Tdap 12/20/2012   Zoster Recombinat (Shingrix) 02/10/2020, 04/09/2020    Conditions to be addressed/monitored:  Hypertension, Hyperlipidemia, Osteoporosis, and Pain  Conditions addressed this visit: Hypertension, hyperlipidemia  Care Plan : [place]  Updates made by Viona Gilmore, [place] since 12/21/2020 12:00 AM     Problem: Problem: Hypertension, Hyperlipidemia, Osteoporosis, and Pain      Long-Range Goal: Patient-Specific Goal   Start Date: 06/24/2020  Expected End Date: 06/24/2021  Recent Progress: On track  Priority: High  Note:   Current Barriers:  Unable to independently monitor therapeutic efficacy Unable to achieve control of cholesterol  Unable to maintain control of blood pressure  Pharmacist Clinical Goal(s):  Patient will achieve adherence to monitoring guidelines and medication adherence to achieve therapeutic efficacy achieve control of cholesterol as evidenced by next lipid panel adhere to plan to optimize therapeutic regimen for cholesterol as evidenced by report of adherence to recommended medication management changes through collaboration with PharmD and provider.   Interventions: 1:1 collaboration with Caren Macadam, MD regarding development and update of comprehensive plan of care as evidenced by provider attestation and co-signature Inter-disciplinary care team collaboration (see longitudinal plan of  care) Comprehensive medication review performed; medication list updated in electronic medical record  Hypertension (BP goal <140/90) -Controlled -Current treatment: Hydrochlorothiazide 12.5 mg 1 capsule daily Potassium ER 10 mEq 1 tablet daily -Medications previously tried: none  -Current home readings: 134/84, 124/74, 119/72, 126/75, 150/76, 152/89, 146/85, 156/80, 175/80 (checking in the evening) -Current dietary habits: cooks with salt but does limit some; reads package labels for sodium -Current exercise habits: walking daily -Denies hypotensive/hypertensive symptoms -Educated on Daily salt intake goal < 2300 mg; Exercise goal of 150 minutes per week; Importance of home blood pressure monitoring; Proper BP monitoring technique; -Counseled to monitor BP at home weekly, document, and provide log at future appointments -Counseled on diet and exercise extensively Recommended to continue current medication  Hyperlipidemia: (LDL goal < 100) -Uncontrolled -Current treatment: Rosuvastatin 1/2 tablet daily Krill oil 500 mg 1 capsule daily -Medications previously tried: red yeast rice -Current dietary patterns: does not eat out much and tries to limit fried foods -Current exercise habits: walking daily -Educated on Cholesterol goals;  Benefits of statin for ASCVD risk reduction; Importance of limiting foods high in cholesterol; Exercise goal of 150 minutes per week; -Counseled on diet and exercise extensively Recommended to continue current medication Recommended repeat lipid panel at next office visit.  Osteoporosis (Goal prevent fractures) -Controlled -Last DEXA Scan: 05/2020   T-Score femoral neck: -2.3  T-Score total hip: n/a  T-Score lumbar spine: -0.4  T-Score forearm radius: n/a  10-year probability of major osteoporotic fracture: n/a  10-year probability of hip fracture: n/a -Patient is a candidate for pharmacologic treatment due to T-Score < -2.5 in femoral neck  (previous DEXA) -Current treatment  Reclast 5 mg once yearly (7 years) - will be taking a holiday Calcium 600 mg-vitamin D 400 units Takes  2 every other day - recommended one a day Vitamin D 2000 units daily Multivitamin D (300 mg calcium)  -Medications previously tried: none  -Recommend [phone] units of vitamin D daily. Recommend 1200 mg of calcium daily from dietary and supplemental sources. Recommend weight-bearing and muscle strengthening exercises for building and maintaining bone density. -Counseled on diet and exercise extensively Recommended to continue current medication Counseled on drug holiday with Reclast  Restless legs syndrome/burning tongue (Goal: minimize symptoms) -Controlled -Current treatment  Gabapentin 300 mg 3 capsules twice daily -Medications previously tried: none  -Recommended to continue current medication  Health Maintenance -Vaccine gaps: Prevnar 20, COVID (declines) -Current therapy:  Multivitamin 1 tablet daily -Educated on Cost vs benefit of each product must be carefully weighed by individual consumer -Patient is satisfied with current therapy and denies issues -Recommended to continue current medication  Patient Goals/Self-Care Activities Patient will:  - take medications as prescribed focus on medication adherence by moving all medications to before eating check blood pressure weekly, document, and provide at future appointments target a minimum of 150 minutes of moderate intensity exercise weekly  Follow Up Plan: The care management team will reach out to the patient again over the next 30 days.  6 months      Medication Assistance: None required.  Patient affirms current coverage meets needs.  Compliance/Adherence/Medication fill history: Care Gaps: COVID, Prevnar 20 BP- 138/82 (09/24/20)  Star-Rating Drugs: Rosuvastatin (Crestor) 5 mg - Last filled 10/05/20 90 DS at [facility]   Patient's preferred pharmacy is:  [facility] [address] [place] [address] Phone: [phone] Fax: [phone]  Uses pill box? Yes Pt endorses 90% compliance  We discussed: Current pharmacy is preferred with insurance plan and patient is satisfied with pharmacy services Patient decided to: Continue current medication management strategy  Care Plan and Follow Up Patient Decision:  Patient agrees to Care Plan and Follow-up.  Plan: The care management team will reach out to the patient again over the next 14 days.  [name], PharmD, [place] Pharmacist [place] [facility]

## 2020-12-27 ENCOUNTER — Telehealth: Payer: Self-pay | Admitting: Pharmacist

## 2020-12-27 NOTE — Telephone Encounter (Signed)
Called patient to relay the below message about increasing to 2 of the HCTZ capsules for now for 2 weeks and will plan to follow up to check back in on blood pressures at that ppint. Patient verbalized her understanding and is in agreement with the plan. Will follow up in 2 weeks.

## 2020-12-27 NOTE — Telephone Encounter (Signed)
-----   Message from Caren Macadam, MD sent at 12/21/2020  9:52 PM EST ----- Regarding: RE: BP readings Seems like with those numbers we can have her increase to 25mg  hctz daily now and then report back in 2 weeks. If she gets lows regularly below 536 systolic or 65 diastolic then return to 12.5mg .   ----- Message ----- From: [id number]$, [place]: 12/21/2020   2:24 PM EST To: 01/03/2021, MD Subject: BP readings                                    Hi,  Ms. Laurna has been having some variable readings with her blood pressure. She reported numbers: 134/84, 124/74, 119/72, 126/75, 150/76, 152/89, 146/85, 156/80, 175/80. She thinks the higher numbers are in the evenings specifically and had a headache a couple of evenings to support this. She has only checked a couple of evening numbers though. I told her to check in the evenings, especially if she is having a headache, and am planning to check back in in 2 weeks. Is that ok? Or would you prefer to make an adjustment now for her meds? She doesn't see you for another month and her readings in other offices have been in the 150s recently.  Let me know! Maddie

## 2021-01-05 ENCOUNTER — Ambulatory Visit
Admission: EM | Admit: 2021-01-05 | Discharge: 2021-01-05 | Disposition: A | Discharge status: Home / Self Care | Payer: Medicare HMO | Attending: Urgent Care | Admitting: Urgent Care

## 2021-01-05 ENCOUNTER — Other Ambulatory Visit: Payer: Self-pay

## 2021-01-05 DIAGNOSIS — J069 Acute upper respiratory infection, unspecified: Secondary | ICD-10-CM

## 2021-01-05 DIAGNOSIS — J3089 Other allergic rhinitis: Secondary | ICD-10-CM

## 2021-01-05 DIAGNOSIS — R0981 Nasal congestion: Secondary | ICD-10-CM

## 2021-01-05 MED ORDER — CETIRIZINE HCL 10 MG PO TABS: 10.0000 mg | ORAL_TABLET | Freq: Every day | ORAL | 0 refills | Status: AC

## 2021-01-05 MED ORDER — PROMETHAZINE-DM 6.25-15 MG/5ML PO SYRP: 5.0000 mL | ORAL_SOLUTION | Freq: Every evening | ORAL | 0 refills | Status: DC | PRN

## 2021-01-05 MED ORDER — BENZONATATE 100 MG PO CAPS: 100.0000 mg | ORAL_CAPSULE | Freq: Three times a day (TID) | ORAL | 0 refills | Status: DC | PRN

## 2021-01-05 MED ORDER — PREDNISONE 10 MG PO TABS: 30.0000 mg | ORAL_TABLET | Freq: Every day | ORAL | 0 refills | Status: DC

## 2021-01-05 NOTE — ED Triage Notes (Signed)
Pt presents with nasal congestion and cough that began on Saturday

## 2021-01-05 NOTE — ED Triage Notes (Signed)
Home covid test negative

## 2021-01-05 NOTE — Discharge Instructions (Addendum)
We will manage this as a viral syndrome. For sore throat or cough try using a honey-based tea. Use 3 teaspoons of honey with juice squeezed from half lemon. Place shaved pieces of ginger into 1/2-1 cup of water and warm over stove top. Then mix the ingredients and repeat every 4 hours as needed. Please take Tylenol 500mg -650mg  once every 6 hours for fevers, aches and pains. Hydrate very well with at least 2 liters (64 ounces) of water. Eat light meals such as soups (chicken and noodles, chicken wild rice, vegetable).  Do not eat any foods that you are allergic to.  Start an antihistamine like Zyrtec for postnasal drainage, sinus congestion.  You can take this together with prednisone, a steroid, to help with your sinus congestion, coughing in the setting of having allergies. You can also use the cough medications as needed.

## 2021-01-05 NOTE — ED Provider Notes (Signed)
[facility]   MRN: [id number] DOB: 02-10-1950  Subjective:   Dawn Reyes is a 70 y.o. female presenting for 3-day history of acute onset persistent sinus congestion, sinus pressure, coughing, throat pain.  No fever, body aches, chest pain, shortness of breath or wheezing.  Patient does have seasonal allergies but is not taking anything for them now.  She is not a smoker.  No history of asthma.  No sick contacts to the best of her knowledge.  She did a COVID test at home and was negative.  Does not want repeats.  No current facility-administered medications for this encounter.  Current Outpatient Medications:    CALCIUM PO, Take 600 mg by mouth daily., Disp: , Rfl:    Cholecalciferol (VITAMIN D3) 2000 UNITS TABS, Take 1 tablet by mouth daily., Disp: , Rfl:    gabapentin (NEURONTIN) 300 MG capsule, Take 3 capsules (900 mg total) by mouth 2 (two) times daily., Disp: 540 capsule, Rfl: 0   hydrochlorothiazide (MICROZIDE) 12.5 MG capsule, TAKE 1 CAPSULE EVERY DAY, Disp: 90 capsule, Rfl: 1   Multiple Vitamin (MULTIVITAMIN) tablet, Take 1 tablet by mouth daily., Disp: , Rfl:    potassium chloride (KLOR-CON) 10 MEQ tablet, TAKE 1 TABLET EVERY DAY, Disp: 90 tablet, Rfl: 1   rosuvastatin (CRESTOR) 5 MG tablet, Take 0.5 tablets (2.5 mg total) by mouth daily., Disp: 45 tablet, Rfl: 3   zoledronic acid (RECLAST) 5 MG/100ML SOLN injection, Inject 100 mLs (5 mg total) into the vein once., Disp: 100 mL, Rfl: 0   Allergies  Allergen Reactions   Vicodin [Hydrocodone-Acetaminophen] Itching    Past Medical History:  Diagnosis Date   Anxiety    Benign hypertension    Burning tongue syndrome 02/06/2013   Candidiasis of mouth    Depression and Anxiety - 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 07/16/2012   Fibromyalgia    GERD (gastroesophageal reflux disease)    Hematochezia 02/13/2012   Left sided abdominal pain 02/13/2012   Osteoporosis, unspecified 07/30/2012     Past Surgical History:   Procedure Laterality Date   ABDOMINAL HYSTERECTOMY     no concern for cancer; menorrhagia   BREAST EXCISIONAL BIOPSY Right 11/14/2002   Radial Scar   FOOT SURGERY Bilateral    mortons neuroma removal   TONSILLECTOMY      Family History  Problem Relation Age of Onset   Hypertension Father    Lung cancer Father    Liver cancer Father    Epilepsy Father    Lung cancer Mother    Arthritis Mother    Hypertension Sister    Osteoporosis Sister    Hypertension Brother    Arthritis Brother    Lung cancer Brother        small cell   Colon cancer Son 76   Diabetes Neg Hx     Social History   Tobacco Use   Smoking status: Never   Smokeless tobacco: Never  Vaping Use   Vaping Use: Never used  Substance Use Topics   Alcohol use: No    Alcohol/week: 0.0 standard drinks   Drug use: No    ROS   Objective:   Vitals: BP 138/83    Pulse 90    Temp 98.2 F (36.8 C)    Resp 20    SpO2 97%   Physical Exam Constitutional:      General: She is not in acute distress.    Appearance: Normal appearance. She is well-developed. She is not  ill-appearing, toxic-appearing or diaphoretic.  HENT:     Head: Normocephalic and atraumatic.     Right Ear: Tympanic membrane, ear canal and external ear normal. There is no impacted cerumen.     Left Ear: Tympanic membrane, ear canal and external ear normal. There is no impacted cerumen.     Nose: Congestion present. No rhinorrhea.     Mouth/Throat:     Mouth: Mucous membranes are moist.     Pharynx: No oropharyngeal exudate or posterior oropharyngeal erythema.  Eyes:     General: No scleral icterus.       Right eye: No discharge.        Left eye: No discharge.     Extraocular Movements: Extraocular movements intact.     Conjunctiva/sclera: Conjunctivae normal.     Pupils: Pupils are equal, round, and reactive to light.  Cardiovascular:     Rate and Rhythm: Normal rate and regular rhythm.     Pulses: Normal pulses.     Heart sounds:  Normal heart sounds. No murmur heard.   No friction rub. No gallop.  Pulmonary:     Effort: Pulmonary effort is normal. No respiratory distress.     Breath sounds: Normal breath sounds. No stridor. No wheezing, rhonchi or rales.  Skin:    General: Skin is warm and dry.     Findings: No rash.  Neurological:     Mental Status: She is alert and oriented to person, place, and time.  Psychiatric:        Mood and Affect: Mood normal.        Behavior: Behavior normal.        Thought Content: Thought content normal.        Judgment: Judgment normal.   Assessment and Plan :   PDMP not reviewed this encounter.  1. Viral URI with cough   2. Sinus congestion   3. Seasonal allergic rhinitis due to other allergic trigger    Deferred imaging given clear cardiopulmonary exam, hemodynamically stable vital signs. Suspect viral URI, viral syndrome. Physical exam findings reassuring and vital signs stable for discharge. Advised supportive care, offered symptomatic relief. Counseled patient on potential for adverse effects with medications prescribed/recommended today, ER and return-to-clinic precautions discussed, patient verbalized understanding.     Jaynee Eagles, PA-C 01/05/21 1037

## 2021-01-08 DIAGNOSIS — I10 Essential (primary) hypertension: Secondary | ICD-10-CM | POA: Diagnosis not present

## 2021-01-08 DIAGNOSIS — E785 Hyperlipidemia, unspecified: Secondary | ICD-10-CM

## 2021-01-17 ENCOUNTER — Ambulatory Visit (INDEPENDENT_AMBULATORY_CARE_PROVIDER_SITE_OTHER): Payer: Self-pay | Admitting: Family Medicine

## 2021-01-17 VITALS — BP 116/84 | HR 77 | Temp 98.0°F | Ht 62.0 in | Wt 132.8 lb

## 2021-01-17 DIAGNOSIS — E785 Hyperlipidemia, unspecified: Secondary | ICD-10-CM

## 2021-01-17 DIAGNOSIS — M818 Other osteoporosis without current pathological fracture: Secondary | ICD-10-CM

## 2021-01-17 DIAGNOSIS — I10 Essential (primary) hypertension: Secondary | ICD-10-CM

## 2021-01-17 LAB — COMPREHENSIVE METABOLIC PANEL
ALT: 13 U/L (ref 0–35)
AST: 18 U/L (ref 0–37)
Albumin: 4.7 g/dL (ref 3.5–5.2)
Alkaline Phosphatase: 54 U/L (ref 39–117)
BUN: 13 mg/dL (ref 6–23)
CO2: 35 mEq/L — ABNORMAL HIGH (ref 19–32)
Calcium: 10.5 mg/dL (ref 8.4–10.5)
Chloride: 95 mEq/L — ABNORMAL LOW (ref 96–112)
Creatinine, Ser: 0.69 mg/dL (ref 0.40–1.20)
GFR: 87.39 mL/min (ref >60.00)
Glucose, Bld: 94 mg/dL (ref 70–99)
Potassium: 3.6 mEq/L (ref 3.5–5.1)
Sodium: 139 mEq/L (ref 135–145)
Total Bilirubin: 0.4 mg/dL (ref 0.2–1.2)
Total Protein: 7.4 g/dL (ref 6.0–8.3)

## 2021-01-17 LAB — LIPID PANEL
Cholesterol: 176 mg/dL (ref 0–200)
HDL: 94.9 mg/dL (ref >39.00)
LDL Cholesterol: 69 mg/dL (ref 0–99)
NonHDL: 81.11
Total CHOL/HDL Ratio: 2
Triglycerides: 61 mg/dL (ref 0.0–149.0)
VLDL: 12.2 mg/dL (ref 0.0–40.0)

## 2021-01-17 LAB — CBC WITH DIFFERENTIAL/PLATELET
Basophils Absolute: 0 10*3/uL (ref 0.0–0.1)
Basophils Relative: 0.5 % (ref 0.0–3.0)
Eosinophils Absolute: 0.1 10*3/uL (ref 0.0–0.7)
Eosinophils Relative: 2 % (ref 0.0–5.0)
HCT: 41.3 % (ref 36.0–46.0)
Hemoglobin: 13.7 g/dL (ref 12.0–15.0)
Lymphocytes Relative: 33.3 % (ref 12.0–46.0)
Lymphs Abs: 2 10*3/uL (ref 0.7–4.0)
MCHC: 33.2 g/dL (ref 30.0–36.0)
MCV: 94.4 fl (ref 78.0–100.0)
Monocytes Absolute: 0.4 10*3/uL (ref 0.1–1.0)
Monocytes Relative: 5.8 % (ref 3.0–12.0)
Neutro Abs: 3.6 10*3/uL (ref 1.4–7.7)
Neutrophils Relative %: 58.4 % (ref 43.0–77.0)
Platelets: 387 10*3/uL (ref 150.0–400.0)
RBC: 4.38 Mil/uL (ref 3.87–5.11)
RDW: 13.5 % (ref 11.5–15.5)
WBC: 6.1 10*3/uL (ref 4.0–10.5)

## 2021-01-17 LAB — VITAMIN D 25 HYDROXY (VIT D DEFICIENCY, FRACTURES): VITD: 53.83 ng/mL (ref 30.00–100.00)

## 2021-01-17 MED ORDER — LISINOPRIL-HYDROCHLOROTHIAZIDE 10-12.5 MG PO TABS: 1.0000 | ORAL_TABLET | Freq: Every day | ORAL | 3 refills | Status: DC

## 2021-01-17 NOTE — Progress Notes (Signed)
Dawn Reyes DOB: Nov 01, 1950 Encounter date: 01/17/2021  This is a 71 y.o. female who presents with blood pressure follow up.  No chief complaint on file.   History of present illness: Seen 12/28 urgent care. These sx seem better.  Last visit with me was 09/2020.    Had appointment last weds and will be turned loose from ortho by next visit. Not having pain anymore. Just knows if on foot too much.   HTN: has brought blood opressure readings today. Has had somelow readings in 106/68 range and some higher in 170's. Most in the 130's/80's. Taking 25mg  hctz in the morning. Recheck in the office today went from 138/82 down to 116/84. Patient's home cuff read 157/83, 152/82 and then my office recheck was 138/70, 138/72.   Osteoporosis:reclast through endocrinology was held for past calendar year due to improvement of bone density.  HL: crestor 2.5mg  daily.  She is tolerating this well.  Allergies  Allergen Reactions   Vicodin [Hydrocodone-Acetaminophen] Itching   Current Meds  Medication Sig   CALCIUM PO Take 600 mg by mouth daily.   cetirizine (ZYRTEC ALLERGY) 10 MG tablet Take 1 tablet (10 mg total) by mouth daily.   Cholecalciferol (VITAMIN D3) 2000 UNITS TABS Take 1 tablet by mouth daily.   gabapentin (NEURONTIN) 300 MG capsule Take 3 capsules (900 mg total) by mouth 2 (two) times daily.   hydrochlorothiazide (MICROZIDE) 12.5 MG capsule TAKE 1 CAPSULE EVERY DAY   Multiple Vitamin (MULTIVITAMIN) tablet Take 1 tablet by mouth daily.   potassium chloride (KLOR-CON) 10 MEQ tablet TAKE 1 TABLET EVERY DAY   promethazine-dextromethorphan (PROMETHAZINE-DM) 6.25-15 MG/5ML syrup Take 5 mLs by mouth at bedtime as needed for cough.   rosuvastatin (CRESTOR) 5 MG tablet Take 0.5 tablets (2.5 mg total) by mouth daily.   zoledronic acid (RECLAST) 5 MG/100ML SOLN injection Inject 100 mLs (5 mg total) into the vein once.    Review of Systems  Constitutional:  Negative for chills, fatigue and  fever.  Respiratory:  Negative for cough, chest tightness, shortness of breath and wheezing.   Cardiovascular:  Negative for chest pain, palpitations and leg swelling.   Objective:  BP 138/82 (BP Location: Left Arm, Patient Position: Sitting, Cuff Size: Normal)    Pulse 77    Temp 98 F (36.7 C) (Oral)    Ht 5\' 2"  (1.575 m)    Wt 132 lb 12.8 oz (60.2 kg)    SpO2 97%    BMI 24.29 kg/m   Weight: 132 lb 12.8 oz (60.2 kg)   BP Readings from Last 3 Encounters:  01/17/21 138/82  01/05/21 138/83  12/16/20 (!) 157/70   Wt Readings from Last 3 Encounters:  01/17/21 132 lb 12.8 oz (60.2 kg)  12/16/20 134 lb 6.4 oz (61 kg)  09/24/20 128 lb 12.8 oz (58.4 kg)    Physical Exam Constitutional:      General: She is not in acute distress.    Appearance: She is well-developed.  Cardiovascular:     Rate and Rhythm: Normal rate and regular rhythm.     Heart sounds: Normal heart sounds. No murmur heard.   No friction rub.  Pulmonary:     Effort: Pulmonary effort is normal. No respiratory distress.     Breath sounds: Normal breath sounds. No wheezing or rales.  Musculoskeletal:     Right lower leg: No edema.     Left lower leg: No edema.  Neurological:     Mental Status: She is  alert and oriented to person, place, and time.  Psychiatric:        Behavior: Behavior normal.    Assessment/Plan 1. Essential hypertension, benign Blood pressure cuff from home seems to be reading about 12 points higher than ours in the office systolically.  Going to adjust her medication to lisinopril hydrochlorothiazide combination, since she does seem to have still some elevated pressures at home.  We will cut back on the hydrochlorothiazide dose since we are adding lisinopril.  I have asked her to monitor blood pressures at home and get a new monitor for better accuracy.  Her blood pressure cuff is over 51 years old.  Let me know if any problems with medications.  Discussed blood pressure goals for her. - CBC with  Differential/Platelet; Future - Comprehensive metabolic panel; Future - lisinopril-hydrochlorothiazide (ZESTORETIC) 10-12.5 MG tablet; Take 1 tablet by mouth daily.  Dispense: 90 tablet; Refill: 3 - Comprehensive metabolic panel - CBC with Differential/Platelet  2. Other osteoporosis without current pathological fracture She is following with endocrinology for management.  Currently only taking vitamin D and weightbearing exercise (limited with recent fracture) - VITAMIN D 25 Hydroxy (Vit-D Deficiency, Fractures); Future - VITAMIN D 25 Hydroxy (Vit-D Deficiency, Fractures)  3. Dyslipidemia Continue with crestor 2.5mg  daily. Tolerating this well. - Lipid panel; Future - Lipid panel   Return in about 3 months (around 04/17/2021) for Chronic condition visit.    Micheline Rough, MD

## 2021-01-17 NOTE — Patient Instructions (Signed)
Your blood pressure goal is between 110-135/65-85. Let me know if your numbers are regularly outside of this range. Please update me through mychart in 2-3 weeks with a few days (up to a week) of readings.

## 2021-01-20 ENCOUNTER — Telehealth: Payer: Self-pay | Admitting: Neurology

## 2021-01-20 MED ORDER — GABAPENTIN 300 MG PO CAPS: 900.0000 mg | ORAL_CAPSULE | Freq: Two times a day (BID) | ORAL | 0 refills | Status: DC

## 2021-01-20 NOTE — Telephone Encounter (Signed)
1. Which medications need refilled? (List name and dosage, if known) gabapentin - the last one that was sent was not filled by [place]. She now has new insurance.  2. Which pharmacy/location is medication to be sent to? (include street and city if Management consultant) [facility] in [place]

## 2021-02-09 DIAGNOSIS — S92354D Nondisplaced fracture of fifth metatarsal bone, right foot, subsequent encounter for fracture with routine healing: Secondary | ICD-10-CM | POA: Diagnosis not present

## 2021-03-02 DIAGNOSIS — L578 Other skin changes due to chronic exposure to nonionizing radiation: Secondary | ICD-10-CM | POA: Diagnosis not present

## 2021-03-02 DIAGNOSIS — Z86018 Personal history of other benign neoplasm: Secondary | ICD-10-CM | POA: Diagnosis not present

## 2021-03-02 DIAGNOSIS — L570 Actinic keratosis: Secondary | ICD-10-CM | POA: Diagnosis not present

## 2021-03-02 DIAGNOSIS — L821 Other seborrheic keratosis: Secondary | ICD-10-CM | POA: Diagnosis not present

## 2021-03-02 DIAGNOSIS — D225 Melanocytic nevi of trunk: Secondary | ICD-10-CM | POA: Diagnosis not present

## 2021-03-02 DIAGNOSIS — L814 Other melanin hyperpigmentation: Secondary | ICD-10-CM | POA: Diagnosis not present

## 2021-03-09 ENCOUNTER — Telehealth: Payer: Self-pay | Admitting: Pharmacist

## 2021-03-09 NOTE — Chronic Care Management (AMB) (Signed)
? ? ?Chronic Care Management ?Pharmacy Assistant  ? ?Name: Dawn Reyes  MRN: [id number] DOB: November 13, 1950 ? ?Reason for Encounter: Disease State Hypertension Assessment ?  ?Conditions to be addressed/monitored: ?HTN ? ?Recent office visits:  ?01/17/21 Caren Macadam, MD - Patient presented for Essential hypertension benign and other concerns. Prescribed Lisinopril HCTZ. Stopped Benzonatate, HCTZ and Prednisone. ? ?Recent consult visits:  ?None ? ?Hospital visits:  ?Medication Reconciliation was completed by comparing discharge summary, patient?s EMR and Pharmacy list, and upon discussion with patient. ? ?Patient presented to [facility] on 01/05/21 due to Viral URI with cough and other concerns. Patient was present for 34 min.  ? ?New?Medications Started at [facility] Discharge:?? ?-started ?benzonatate 100 MG ?cetirizine 10 MG tablet ?predniSONE 10 MG tablet ? ?Medication Changes at Hospital Discharge: ?-Changed  ?none ? ?Medications Discontinued at Hospital Discharge: ?-Stopped  ?none ? ?Medications that remain the same after Hospital Discharge:??  ?-All other medications will remain the same.   ? ?Medications: ?Outpatient Encounter Medications as of 03/09/2021  ?Medication Sig  ? CALCIUM PO Take 600 mg by mouth daily.  ? cetirizine (ZYRTEC ALLERGY) 10 MG tablet Take 1 tablet (10 mg total) by mouth daily.  ? Cholecalciferol (VITAMIN D3) 2000 UNITS TABS Take 1 tablet by mouth daily.  ? gabapentin (NEURONTIN) 300 MG capsule Take 3 capsules (900 mg total) by mouth 2 (two) times daily.  ? lisinopril-hydrochlorothiazide (ZESTORETIC) 10-12.5 MG tablet Take 1 tablet by mouth daily.  ? Multiple Vitamin (MULTIVITAMIN) tablet Take 1 tablet by mouth daily.  ? potassium chloride (KLOR-CON) 10 MEQ tablet TAKE 1 TABLET EVERY DAY  ? promethazine-dextromethorphan (PROMETHAZINE-DM) 6.25-15 MG/5ML syrup Take 5 mLs by mouth at bedtime as needed for cough.  ? rosuvastatin (CRESTOR) 5 MG tablet Take 0.5 tablets  (2.5 mg total) by mouth daily.  ? zoledronic acid (RECLAST) 5 MG/100ML SOLN injection Inject 100 mLs (5 mg total) into the vein once.  ? ?No facility-administered encounter medications on file as of 03/09/2021.  ?Reviewed chart prior to disease state call. Spoke with patient regarding BP ? ?Recent Office Vitals: ?BP Readings from Last 3 Encounters:  ?01/17/21 116/84  ?01/05/21 138/83  ?12/16/20 (!) 157/70  ? ?Pulse Readings from Last 3 Encounters:  ?01/17/21 77  ?01/05/21 90  ?12/16/20 78  ?  ?Wt Readings from Last 3 Encounters:  ?01/17/21 132 lb 12.8 oz (60.2 kg)  ?12/16/20 134 lb 6.4 oz (61 kg)  ?09/24/20 128 lb 12.8 oz (58.4 kg)  ?  ? ?Kidney Function ?Lab Results  ?Component Value Date/Time  ? CREATININE 0.69 01/17/2021 10:59 AM  ? CREATININE 0.72 07/16/2020 09:27 AM  ? GFR 87.39 01/17/2021 10:59 AM  ? ? ?BMP Latest Ref Rng & Units 01/17/2021 07/16/2020 05/04/2020  ?Glucose 70 - 99 mg/dL 94 86 85  ?BUN 6 - 23 mg/dL 13 10 12   ?Creatinine 0.40 - 1.20 mg/dL 0.69 0.72 0.76  ?Sodium 135 - 145 mEq/L 139 140 135  ?Potassium 3.5 - 5.1 mEq/L 3.6 4.2 3.7  ?Chloride 96 - 112 mEq/L 95(L) 100 97  ?CO2 19 - 32 mEq/L 35(H) 31 31  ?Calcium 8.4 - 10.5 mg/dL 10.5 9.4 9.3  ? ? ?Current antihypertensive regimen:  ?Hydrochlorothiazide 12.5 mg 1 capsule daily ?Potassium ER 10 mEq 1 tablet daily ?How often are you checking your Blood Pressure? weekly ?Current home BP readings: 117/67 124/64 116/71 104/63 114/66 ?What recent interventions/DTPs have been made by any provider to improve Blood Pressure control since last  CPP Visit: Patient reports no changes  ?Any recent hospitalizations or ED visits since last visit with CPP? None ?What diet changes have been made to improve Blood Pressure Control?  ?Patient reports she is still watching her sodium intake with meals ?What exercise is being done to improve your Blood Pressure Control?  ?Patient reports walking daily ? ?Adherence Review: ?Is the patient currently on ACE/ARB medication? No ?Does  the patient have >5 day gap between last estimated fill dates? No ? ? ?Care Gaps: ?COVID Booster - Overdue ?BP- 116/84 ( 01/17/21) ?AWV- 05/11/20 ?CCM - 6/23 ?Lab Results  ?Component Value Date  ? HGBA1C 5.6 01/05/2020  ? ? ?Star Rating Drugs: ?Rosuvastatin (Crestor) 5 mg - Last filled 12/21/20 90 DS at [place]  ? ? ?Ned Clines CMA ?Clinical Pharmacist Assistant ?[phone] ? ?

## 2021-04-04 DIAGNOSIS — S92354D Nondisplaced fracture of fifth metatarsal bone, right foot, subsequent encounter for fracture with routine healing: Secondary | ICD-10-CM | POA: Diagnosis not present

## 2021-04-19 ENCOUNTER — Encounter: Payer: Self-pay | Admitting: Family Medicine

## 2021-04-25 ENCOUNTER — Encounter: Payer: Self-pay | Admitting: Family Medicine

## 2021-04-25 ENCOUNTER — Ambulatory Visit (INDEPENDENT_AMBULATORY_CARE_PROVIDER_SITE_OTHER): Payer: Medicare Other | Admitting: Family Medicine

## 2021-04-25 VITALS — BP 152/80 | HR 70 | Temp 98.2°F | Ht 62.0 in | Wt 142.6 lb

## 2021-04-25 DIAGNOSIS — I10 Essential (primary) hypertension: Secondary | ICD-10-CM | POA: Diagnosis not present

## 2021-04-25 DIAGNOSIS — M7062 Trochanteric bursitis, left hip: Secondary | ICD-10-CM | POA: Diagnosis not present

## 2021-04-25 MED ORDER — HYDROCHLOROTHIAZIDE 12.5 MG PO TABS: 12.5000 mg | ORAL_TABLET | Freq: Every day | ORAL | 3 refills | Status: DC

## 2021-04-25 MED ORDER — TRIAMCINOLONE ACETONIDE 40 MG/ML IJ SUSP
40.0000 mg | Freq: Once | INTRAMUSCULAR | Status: AC
  Administered 2021-04-25: 40 mg via INTRA_ARTICULAR

## 2021-04-25 NOTE — Patient Instructions (Signed)
Start back with hctz 12.'5mg'$  daily. Check pressures at home. If you are still regularly over 161 systolic after a week, then increase to 2 of the hctz daily and let me know.  ? ? ?*belt-toe stretch - grip non-stretch belt with both hands and keep leg at 90 -ish degree angle with straight leg in air while you are laying on back. Then pull belt across body to stretch side of leg.  ? ?*cross leg toe touch.  ? ?*ice left hip twice daily for 20 minutes if able - at least for a couple of weeks.  ?

## 2021-04-25 NOTE — Progress Notes (Signed)
?Dawn Reyes ?DOB: July 19, 1950 ?Encounter date: 04/25/2021 ? ?This is a 71 y.o. female who presents with ?Chief Complaint  ?Patient presents with  ? Follow-up  ?  Patient states she discontinued Lisinopril-HCTZ due to low BP reading at home  ? ? ?History of present illness: ?Lowest she saw was 98/58. Didn't feel bad at that low pressure. Similar numbers to todays at home. Not really anything higher than 140's. Stopped medication about 1.5 weeks ago.  ? ?Still has one more trip to ortho for follow up. Still has hairline fracture. Walking with heavy boot on right foot has caused increased pain left hip. She has been able to start walking again and doing 2 miles/daily. Doing some stretches with left hip. She is being cautious with exercises.  ? ? ?Allergies  ?Allergen Reactions  ? Vicodin [Hydrocodone-Acetaminophen] Itching  ? ?Current Meds  ?Medication Sig  ? CALCIUM PO Take 600 mg by mouth daily.  ? cetirizine (ZYRTEC ALLERGY) 10 MG tablet Take 1 tablet (10 mg total) by mouth daily.  ? Cholecalciferol (VITAMIN D3) 2000 UNITS TABS Take 1 tablet by mouth daily.  ? gabapentin (NEURONTIN) 300 MG capsule Take 3 capsules (900 mg total) by mouth 2 (two) times daily.  ? hydrochlorothiazide (HYDRODIURIL) 12.5 MG tablet Take 1 tablet (12.5 mg total) by mouth daily.  ? Multiple Vitamin (MULTIVITAMIN) tablet Take 1 tablet by mouth daily.  ? potassium chloride (KLOR-CON) 10 MEQ tablet TAKE 1 TABLET EVERY DAY  ? promethazine-dextromethorphan (PROMETHAZINE-DM) 6.25-15 MG/5ML syrup Take 5 mLs by mouth at bedtime as needed for cough.  ? rosuvastatin (CRESTOR) 5 MG tablet Take 0.5 tablets (2.5 mg total) by mouth daily.  ? zoledronic acid (RECLAST) 5 MG/100ML SOLN injection Inject 100 mLs (5 mg total) into the vein once.  ? ? ?Review of Systems  ?Constitutional:  Negative for chills, fatigue and fever.  ?Respiratory:  Negative for cough, chest tightness, shortness of breath and wheezing.   ?Cardiovascular:  Negative for chest pain,  palpitations and leg swelling.  ?Musculoskeletal:  Positive for arthralgias (left hip pain).  ? ?Objective: ? ?BP (!) 152/80   Pulse 70   Temp 98.2 ?F (36.8 ?C) (Oral)   Ht '5\' 2"'$  (1.575 m)   Wt 142 lb 9.6 oz (64.7 kg)   SpO2 98%   BMI 26.08 kg/m?   Weight: 142 lb 9.6 oz (64.7 kg)  ? ?BP Readings from Last 3 Encounters:  ?04/25/21 (!) 152/80  ?01/17/21 116/84  ?01/05/21 138/83  ? ?Wt Readings from Last 3 Encounters:  ?04/25/21 142 lb 9.6 oz (64.7 kg)  ?01/17/21 132 lb 12.8 oz (60.2 kg)  ?12/16/20 134 lb 6.4 oz (61 kg)  ? ? ?Physical Exam ?Constitutional:   ?   General: She is not in acute distress. ?   Appearance: She is well-developed.  ?Cardiovascular:  ?   Rate and Rhythm: Normal rate and regular rhythm.  ?   Heart sounds: Normal heart sounds. No murmur heard. ?  No friction rub.  ?Pulmonary:  ?   Effort: Pulmonary effort is normal. No respiratory distress.  ?   Breath sounds: Normal breath sounds. No wheezing or rales.  ?Musculoskeletal:  ?   Right lower leg: No edema.  ?   Left lower leg: No edema.  ?   Comments: Left trochanteric bursa tenderness. Full rom of left hip. IT band tenderness left.   ?Neurological:  ?   Mental Status: She is alert and oriented to person, place, and time.  ?Psychiatric:     ?  Behavior: Behavior normal.  ? ? ?Joint injection: ?After consent was obtained, using sterile technique the left trochanteric bursa was palpated and the area was prepped with ChloraPrep.  1 cc (40 mg) Kenalog with 3 cc lidocaine injected into the bursa.  Patient tolerated injection well.  Sterile dressing applied.  Patient encouraged to ice this area.  We discussed that pain can increase after injection for a couple of days.  We reviewed stretching exercises to help with hip and IT band tenderness. ? ?Assessment/Plan ? ?1. Greater trochanteric bursitis of left hip ?Injection given in the office today.  Previously she has done very well with this.  Ice, stretch.  Let me know if no improvement within a  couple of weeks. ? ?2. Essential hypertension, benign ?Blood pressure was too low on combination lisinopril-hydrochlorothiazide medication.  She did well with hydrochlorothiazide in the past and would like to restart this.  I am going to restart her at a low dose and let her titrate up for blood pressure control.  We are starting with 12.5 mg daily.  She will update me through MyChart with pressures in a couple of weeks. ? ? ?Return if symptoms worsen or fail to improve. ? ? ? ? ? ?Dawn Rough, MD ?

## 2021-05-06 ENCOUNTER — Other Ambulatory Visit (INDEPENDENT_AMBULATORY_CARE_PROVIDER_SITE_OTHER): Payer: Medicare Other

## 2021-05-06 DIAGNOSIS — M810 Age-related osteoporosis without current pathological fracture: Secondary | ICD-10-CM | POA: Diagnosis not present

## 2021-05-06 LAB — BASIC METABOLIC PANEL
BUN: 15 mg/dL (ref 6–23)
CO2: 34 mEq/L — ABNORMAL HIGH (ref 19–32)
Calcium: 9.7 mg/dL (ref 8.4–10.5)
Chloride: 99 mEq/L (ref 96–112)
Creatinine, Ser: 0.7 mg/dL (ref 0.40–1.20)
GFR: 86.9 mL/min (ref >60.00)
Glucose, Bld: 83 mg/dL (ref 70–99)
Potassium: 4.8 mEq/L (ref 3.5–5.1)
Sodium: 139 mEq/L (ref 135–145)

## 2021-05-09 NOTE — Progress Notes (Signed)
? ? ? ?Patient ID: Dawn Reyes, female   DOB: [birth year], 71 y.o.   MRN: [id number] ? ? ?History of Present Illness: ? ?OSTEOPOROSIS: ? ? ?Background history includes menopause at the usual age of about 25+.  ? ?She was told by her gynecologist in 2006 that she had osteopenia on screening and bone density.  ?She  had a T score of -2.0 at the right right neck of femur but her spine was normal ?At some point the patient was given a trial of Fosamax which she took for about 2 years but stopped it because of difficulty with lower chest and upper abdominal discomfort when she would take this ?She also tried this again in 2014 but could not tolerate it for longer than 6 months ? ?Recent history: ?In 2011 she was found to have a T score of -2.5 at the hip and  subsequently this was -2.7 at the left hip in 2014 ?She was given Atelvia in 2014 and she took this for 3-4 months and did not follow-up for a couple of years after that ? ?Initial consultation she reported that she had lost only about a half an inch in height since her youth ? ? ?She was given Reclast on 05/19/15, 05/09/16  05/15/2017, in 06/2018 and 06/2019 ? ?She tolerated all infusions well without side effects ? ?Recently not complaining of any acute back pain ?She did have a fifth metatarsal undisplaced fracture in 9/22 because of her fall ?Her height is unchanged at 5 feet 2 inches ? ?She has been taking vitamin D3, 2000 units daily ?Her vitamin D levels are consistently normal ?She takes calcium twice daily  ?She is recently starting back on her walking ? ?She is now here for annual follow-up ? ?Her bone density data is as follows ? ?04/25/2016 Lumbar spine (L1-L4) Femoral neck (FN) 33% distal radius  ?T-score -0.6 RFN:-2.4 ?LFN:-2.4 n/a  ?Change in BMD from previous DXA test (%) Up 5.6% Up 3.5% n/a  ? ? ?05/14/20 Lumbar spine L1-L4(L3) Femoral neck (FN)  ?T-score  -0.4 RFN: -2.0 ?LFN: -2.3  ?Change in BMD from previous DXA test (%) Up 1.8% Up 5.3%*   ? ? ?Vitamin D levels: ? ?Lab Results  ?Component Value Date  ? VD25OH 53.83 01/17/2021  ? VD25OH 48.49 05/04/2020  ? VD25OH 47.23 04/28/2019  ? ? ? ?Past Medical History:  ?Diagnosis Date  ? Anxiety   ? Benign hypertension   ? 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 Candidiasis of mouth   ? Depression and Anxiety - 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 07/16/2012  ? Fibromyalgia   ? GERD (gastroesophageal reflux disease)   ? Hematochezia 02/13/2012  ? Left sided abdominal pain 02/13/2012  ? Osteoporosis, unspecified 07/30/2012  ? ? ?Past Surgical History:  ?Procedure Laterality Date  ? ABDOMINAL HYSTERECTOMY    ? no concern for cancer; menorrhagia  ? BREAST EXCISIONAL BIOPSY Right 11/14/2002  ? Radial Scar  ? FOOT SURGERY Bilateral   ? mortons neuroma removal  ? TONSILLECTOMY    ? ? ?Family History  ?Problem Relation Age of Onset  ? Lung cancer Mother   ? Arthritis Mother   ? Hypertension Father   ? Lung cancer Father   ? Liver cancer Father   ? Epilepsy Father   ? Hypertension Sister   ? Osteoporosis Sister   ? Hypertension Brother   ? Arthritis Brother   ? Lung cancer Brother 79  ?     small cell  ?  Colon cancer Son 71  ? Diabetes Neg Hx   ? ? ?Social History:  reports that she has never smoked. She has never used smokeless tobacco. She reports that she does not drink alcohol and does not use drugs. ? ?Allergies:  ?Allergies  ?Allergen Reactions  ? Vicodin [Hydrocodone-Acetaminophen] Itching  ? ? ?Allergies as of 05/10/2021   ? ?   Reactions  ? Vicodin [hydrocodone-acetaminophen] Itching  ? ?  ? ?  ?Medication List  ?  ? ?  ? Accurate as of May 10, 2021  8:16 AM. If you have any questions, ask your nurse or doctor.  ?  ?  ? ?  ? ?CALCIUM PO ?Take 600 mg by mouth daily. ?  ?cetirizine 10 MG tablet ?Commonly known as: ZyrTEC Allergy ?Take 1 tablet (10 mg total) by mouth daily. ?  ?gabapentin 300 MG capsule ?Commonly known as: NEURONTIN ?Take 3 capsules (900 mg total) by mouth 2 (two) times daily. ?  ?hydrochlorothiazide 12.5 MG  tablet ?Commonly known as: HYDRODIURIL ?Take 1 tablet (12.5 mg total) by mouth daily. ?  ?multivitamin tablet ?Take 1 tablet by mouth daily. ?  ?potassium chloride 10 MEQ tablet ?Commonly known as: KLOR-CON ?TAKE 1 TABLET EVERY DAY ?  ?promethazine-dextromethorphan 6.25-15 MG/5ML syrup ?Commonly known as: PROMETHAZINE-DM ?Take 5 mLs by mouth at bedtime as needed for cough. ?  ?rosuvastatin 5 MG tablet ?Commonly known as: Crestor ?Take 0.5 tablets (2.5 mg total) by mouth daily. ?  ?Vitamin D3 50 MCG (2000 UT) Tabs ?Take 1 tablet by mouth daily. ?  ?zoledronic acid 5 MG/100ML Soln injection ?Commonly known as: Reclast ?Inject 100 mLs (5 mg total) into the vein once. ?  ? ?  ? ? ? ?REVIEW OF SYSTEMS:       ? ?She is on HCTZ for hypertension ? ?Normal renal function ? ?Lab Results  ?Component Value Date  ? CREATININE 0.70 05/06/2021  ? BUN 15 05/06/2021  ? NA 139 05/06/2021  ? K 4.8 05/06/2021  ? CL 99 05/06/2021  ? CO2 34 (H) 05/06/2021  ? ? ? ?EXAM: ? ?BP 122/82   Pulse 61   Ht '5\' 2"'$  (1.575 m)   Wt 141 lb 6.4 oz (64.1 kg)   SpO2 96%   BMI 25.86 kg/m?  ? ? ? ? ?ASSESSMENT:  ? ?History of osteoporosis, long-standing and asymptomatic ?Has had insignificant height loss previously and none more recently ? ?She has mild osteoporosis at the left neck of the femur with lowest T score -2.7 in 2014 which improved to -2.4 in 2018 and in 2022 was -2.4 although left femur T score was -2.0 ?She has tolerated Reclast annually, last injection in June 2021 for a total of 5 infusions ? ?Although she did have a metatarsal fracture this was related to a fall ? ?Her vitamin D level is consistently normal with 2000 units of vitamin D3 ?Normal renal and calcium results ? ?  ?PLAN:  ? ?For maintenance of treatment of her osteopenia will restart Reclast and for now we will plan to do this every other year ?She was given the referral to have the infusion done at the infusion center today ?To continue calcium and vitamin D ? ?Elayne Snare ?05/10/2021, 8:16 AM  ? ? ?

## 2021-05-10 ENCOUNTER — Ambulatory Visit (INDEPENDENT_AMBULATORY_CARE_PROVIDER_SITE_OTHER): Payer: Medicare Other | Admitting: Endocrinology

## 2021-05-10 ENCOUNTER — Encounter: Payer: Self-pay | Admitting: Endocrinology

## 2021-05-10 ENCOUNTER — Telehealth: Payer: Self-pay | Admitting: Pharmacy Technician

## 2021-05-10 VITALS — BP 122/82 | HR 61 | Ht 62.0 in | Wt 141.4 lb

## 2021-05-10 DIAGNOSIS — M810 Age-related osteoporosis without current pathological fracture: Secondary | ICD-10-CM

## 2021-05-10 NOTE — Telephone Encounter (Signed)
Auth Submission: no auth needed ?Payer: medicare a/b -  ?Medication & CPT/J Code(s) submitted: Reclast (Zolendronic acid) J3489 ?Route of submission (phone, fax, portal): phone: [phone] ?Auth type: Buy/Bill ?Units/visits requested: 1 ?Reference number: [id number] ?Approval from: 05/10/21 to 01/08/22  ?

## 2021-05-11 ENCOUNTER — Other Ambulatory Visit: Payer: Self-pay | Admitting: Neurology

## 2021-05-11 ENCOUNTER — Telehealth: Payer: Self-pay | Admitting: Family Medicine

## 2021-05-11 NOTE — Telephone Encounter (Signed)
Left message for patient to call back and schedule Medicare Annual Wellness Visit (AWV) either virtually or in office. Left  my Herbie Drape number [phone] ? ? ?Last AWV ;05/11/20 ? please schedule at anytime with [facility] Nurse Health Advisor 1 or 2 ? ? ?

## 2021-05-16 DIAGNOSIS — S92354D Nondisplaced fracture of fifth metatarsal bone, right foot, subsequent encounter for fracture with routine healing: Secondary | ICD-10-CM | POA: Diagnosis not present

## 2021-05-23 ENCOUNTER — Ambulatory Visit: Payer: Medicare Other | Admitting: Family Medicine

## 2021-05-25 ENCOUNTER — Ambulatory Visit (INDEPENDENT_AMBULATORY_CARE_PROVIDER_SITE_OTHER): Payer: Medicare Other

## 2021-05-25 VITALS — BP 135/80 | HR 56 | Temp 97.5°F | Resp 16 | Ht 62.0 in | Wt 140.8 lb

## 2021-05-25 DIAGNOSIS — M810 Age-related osteoporosis without current pathological fracture: Secondary | ICD-10-CM | POA: Diagnosis not present

## 2021-05-25 MED ORDER — ACETAMINOPHEN 325 MG PO TABS: 650.0000 mg | ORAL_TABLET | Freq: Once | ORAL | Status: DC

## 2021-05-25 MED ORDER — SODIUM CHLORIDE 0.9 % IV SOLN: INTRAVENOUS | Status: DC

## 2021-05-25 MED ORDER — DIPHENHYDRAMINE HCL 25 MG PO CAPS: 25.0000 mg | ORAL_CAPSULE | Freq: Once | ORAL | Status: DC

## 2021-05-25 MED ORDER — ZOLEDRONIC ACID 5 MG/100ML IV SOLN
5.0000 mg | Freq: Once | INTRAVENOUS | Status: AC
  Administered 2021-05-25: 5 mg via INTRAVENOUS
  Filled 2021-05-25: qty 100

## 2021-05-25 NOTE — Progress Notes (Signed)
Diagnosis: Osteoporosis ? ?Provider:  Marshell Garfinkel, MD ? ?Procedure: Infusion ? ?IV Type: Peripheral, IV Location: L Antecubital ? ?Reclast (Zolendronic Acid), Dose: 5 mg ? ?Infusion Start Time: 4862 ? ?Infusion Stop Time: 1010 ? ?Post Infusion IV Care: Peripheral IV Discontinued ? ?Discharge: Condition: Good, Destination: Home . AVS declined. ? ?Performed by:  Koren Shiver, RN  ?  ?

## 2021-06-07 ENCOUNTER — Other Ambulatory Visit: Payer: Self-pay | Admitting: Family Medicine

## 2021-06-07 DIAGNOSIS — Z1231 Encounter for screening mammogram for malignant neoplasm of breast: Secondary | ICD-10-CM

## 2021-06-13 ENCOUNTER — Other Ambulatory Visit: Payer: Self-pay | Admitting: *Deleted

## 2021-06-13 MED ORDER — ROSUVASTATIN CALCIUM 5 MG PO TABS: 2.5000 mg | ORAL_TABLET | Freq: Every day | ORAL | 3 refills | Status: DC

## 2021-06-20 ENCOUNTER — Telehealth: Payer: Self-pay | Admitting: Family Medicine

## 2021-06-20 NOTE — Telephone Encounter (Signed)
Left message for patient to call back and schedule Medicare Annual Wellness Visit (AWV) either virtually or in office. Left  my Herbie Drape number [phone]   Last AWV 05/11/20 ; please schedule at anytime with [facility] Nurse Health Advisor 1 or 2

## 2021-06-24 ENCOUNTER — Ambulatory Visit (INDEPENDENT_AMBULATORY_CARE_PROVIDER_SITE_OTHER): Payer: Medicare Other

## 2021-06-24 VITALS — BP 117/70 | Ht 62.0 in | Wt 141.0 lb

## 2021-06-24 DIAGNOSIS — Z0000 Encounter for general adult medical examination without abnormal findings: Secondary | ICD-10-CM | POA: Diagnosis not present

## 2021-06-24 NOTE — Progress Notes (Signed)
Subjective:   Dawn Reyes is a 71 y.o. female who presents for Medicare Annual (Subsequent) preventive examination.  Review of Systems    Virtual Visit via Telephone Note  I connected with  Dawn Reyes on 06/24/21 at  8:15 AM EDT by telephone and verified that I am speaking with the correct person using two identifiers.  Location: Patient: Home Provider: Office Persons participating in the virtual visit: patient/Nurse Health Advisor   I discussed the limitations, risks, security and privacy concerns of performing an evaluation and management service by telephone and the availability of in person appointments. The patient expressed understanding and agreed to proceed.  Interactive audio and video telecommunications were attempted between this nurse and patient, however failed, due to patient having technical difficulties OR patient did not have access to video capability.  We continued and completed visit with audio only.  Some vital signs may be absent or patient reported.   Dawn Peaches, LPN  Cardiac Risk Factors include: advanced age (>67mn, >>87women);hypertension     Objective:    Today's Vitals   06/24/21 0816  BP: 117/70  Weight: 141 lb (64 kg)  Height: '5\' 2"'$  (1.575 m)   Body mass index is 25.79 kg/m.     06/24/2021    8:26 AM 05/11/2020    8:24 AM 03/09/2020    8:40 AM 04/27/2016    8:07 AM 08/19/2015    8:50 AM 08/28/2014    9:10 AM 08/28/2014    8:58 AM  Advanced Directives  Does Patient Have a Medical Advance Directive? Yes No No Yes Yes Yes No  Type of [place]   [place] Living will;Healthcare Power of [place];Advance instruction for mental health treatment;Mental Health Advance Directive   Does patient want to make changes to medical advance directive? No - Patient declined    No - Patient declined No - Patient declined   Copy of  [place] Chart? No - copy requested   No - copy requested  No - copy requested   Would patient like information on creating a medical advance directive?  No - Patient declined No - Patient declined   No - patient declined information No - patient declined information    Current Medications (verified) Outpatient Encounter Medications as of 06/24/2021  Medication Sig   CALCIUM PO Take 600 mg by mouth daily.   cetirizine (ZYRTEC ALLERGY) 10 MG tablet Take 1 tablet (10 mg total) by mouth daily.   Cholecalciferol (VITAMIN D3) 2000 UNITS TABS Take 1 tablet by mouth daily.   gabapentin (NEURONTIN) 300 MG capsule TAKE 3 CAPSULES BY MOUTH TWICE DAILY   hydrochlorothiazide (HYDRODIURIL) 12.5 MG tablet Take 1 tablet (12.5 mg total) by mouth daily.   Multiple Vitamin (MULTIVITAMIN) tablet Take 1 tablet by mouth daily.   potassium chloride (KLOR-CON) 10 MEQ tablet TAKE 1 TABLET EVERY DAY   promethazine-dextromethorphan (PROMETHAZINE-DM) 6.25-15 MG/5ML syrup Take 5 mLs by mouth at bedtime as needed for cough. (Patient not taking: Reported on 05/10/2021)   rosuvastatin (CRESTOR) 5 MG tablet Take 0.5 tablets (2.5 mg total) by mouth daily.   zoledronic acid (RECLAST) 5 MG/100ML SOLN injection Inject 100 mLs (5 mg total) into the vein once.   No facility-administered encounter medications on file as of 06/24/2021.    Allergies (verified) Vicodin [hydrocodone-acetaminophen]   History: Past Medical History:  Diagnosis Date   Anxiety  Benign hypertension    Burning tongue syndrome 02/06/2013   Candidiasis of mouth    Depression and Anxiety - 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 07/16/2012   Fibromyalgia    GERD (gastroesophageal reflux disease)    Hematochezia 02/13/2012   Left sided abdominal pain 02/13/2012   Osteoporosis, unspecified 07/30/2012   Past Surgical History:  Procedure Laterality Date   ABDOMINAL HYSTERECTOMY     no concern for cancer; menorrhagia   BREAST EXCISIONAL  BIOPSY Right 11/14/2002   Radial Scar   FOOT SURGERY Bilateral    mortons neuroma removal   TONSILLECTOMY     Family History  Problem Relation Age of Onset   Lung cancer Mother    Arthritis Mother    Hypertension Father    Lung cancer Father    Liver cancer Father    Epilepsy Father    Hypertension Sister    Osteoporosis Sister    Hypertension Brother    Arthritis Brother    Lung cancer Brother 24       small cell   Colon cancer Son 71   Diabetes Neg Hx    Social History   Socioeconomic History   Marital status: Widowed    Spouse name: Not on file   Number of children: 2   Years of education: Not on file   Highest education level: 12th grade  Occupational History   Occupation: CSR    Employer: [employer]  Tobacco Use   Smoking status: Never   Smokeless tobacco: Never  Vaping Use   Vaping Use: Never used  Substance and Sexual Activity   Alcohol use: No    Alcohol/week: 0.0 standard drinks of alcohol   Drug use: No   Sexual activity: Never  Other Topics Concern   Not on file  Social History Narrative   Work or School: [employer]      Home Situation: lives with husband      Spiritual Beliefs: Baptist      Lifestyle: no regular exercise; diet is ok               Social Determinants of Radio broadcast assistant Strain: Low Risk  (06/24/2021)   Overall Financial Resource Strain (CARDIA)    Difficulty of Paying Living Expenses: Not very hard  Food Insecurity: No Food Insecurity (06/24/2021)   Hunger Vital Sign    Worried About Running Out of Food in the Last Year: Never true    Ran Out of Food in the Last Year: Never true  Transportation Needs: No Transportation Needs (06/24/2021)   PRAPARE - Hydrologist (Medical): No    Lack of Transportation (Non-Medical): No  Physical Activity: Sufficiently Active (06/24/2021)   Exercise Vital Sign    Days of Exercise per Week: 4 days    Minutes of Exercise per  Session: 60 min  Stress: No Stress Concern Present (06/24/2021)   Refugio    Feeling of Stress : Not at all  Social Connections: Socially Isolated (06/24/2021)   Social Connection and Isolation Panel [NHANES]    Frequency of Communication with Friends and Family: More than three times a week    Frequency of Social Gatherings with Friends and Family: Three times a week    Attends Religious Services: Never    Active Member of Clubs or Organizations: No    Attends Archivist Meetings: Patient refused  Marital Status: Widowed    Tobacco Counseling Counseling given: Not Answered   Clinical Intake:  BMI - recorded: 25.86 Nutritional Status: BMI 25 -29 Overweight Nutritional Risks: None Diabetes: No  How often do you need to have someone help you when you read instructions, pamphlets, or other written materials from your doctor or pharmacy?: 1 - Never  Diabetic?  No  Interpreter Needed?: No Activities of Daily Living    06/24/2021    8:23 AM 06/24/2021    6:45 AM  In your present state of health, do you have any difficulty performing the following activities:  Hearing? 0 0  Vision? 0 0  Difficulty concentrating or making decisions? 0 0  Walking or climbing stairs? 0 0  Dressing or bathing? 0 0  Doing errands, shopping? 0 0  Preparing Food and eating ? N N  Using the Toilet? N N  In the past six months, have you accidently leaked urine? N N  Do you have problems with loss of bowel control? N N  Managing your Medications? N N  Managing your Finances? N N  Housekeeping or managing your Housekeeping? N N    Patient Care Team: Caren Macadam, MD (Inactive) as PCP - General (Family Medicine) Paula Compton, MD as Attending Physician (Obstetrics and Gynecology) Irene Shipper, MD as Attending Physician (Gastroenterology) [place], Jennefer Bravo, MD as Referring Physician (Dermatology) Viona Gilmore, [facility] as Pharmacist (Pharmacist)  Indicate any recent Medical Services you may have received from other than [facility] providers in the past year (date may be approximate).     Assessment:   This is a routine wellness examination for [place].  Hearing/Vision screen Hearing Screening - Comments:: No difficulty hearing Vision Screening - Comments:: Wears glasses. Followed by [facility]  Dietary issues and exercise activities discussed: Exercise limited by: None identified   Goals Addressed               This Visit's Progress     Increase physical activity (pt-stated)        Eating healthy        Depression Screen    06/24/2021    8:21 AM 04/25/2021   10:00 AM 01/17/2021    9:55 AM 05/11/2020    8:22 AM 01/05/2020   10:41 AM 05/03/2017    9:16 AM 04/27/2016    7:00 AM  PHQ 2/9 Scores  PHQ - 2 Score 0 0 1 0 0 0 0  PHQ- 9 Score 0 4 8        Fall Risk    06/24/2021    8:24 AM 06/24/2021    6:45 AM 01/17/2021    3:19 AM 05/11/2020    8:24 AM 01/05/2020   10:41 AM  Fall Risk   Falls in the past year? '1 1 1 '$ 0 0  Number falls in past yr: 0 0 0 0 0  Injury with Fall? '1 1 1 '$ 0   Comment Fx Foot. Followed by Orthopedic      Risk for fall due to : Orthopedic patient      Follow up    Falls evaluation completed     [place]:  Any stairs in or around the home? No  If so, are there any without handrails? No  Home free of loose throw rugs in walkways, pet beds, electrical cords, etc? Yes  Adequate lighting in your home to reduce risk of falls? Yes   ASSISTIVE DEVICES UTILIZED  TO PREVENT FALLS:  Life alert? No  Use of a cane, walker or w/c? No  Grab bars in the bathroom? Yes  Shower chair or bench in shower? No  Elevated toilet seat or a handicapped toilet? No   TIMED UP AND GO:  Was the test performed? No . Audio Visit  Cognitive Function:        06/24/2021    8:27 AM  6CIT Screen  What Year? 0 points  What month? 0 points   What time? 0 points  Count back from 20 0 points  Months in reverse 0 points  Repeat phrase 0 points  Total Score 0 points    Immunizations Immunization History  Administered Date(s) Administered   Tdap 12/20/2012   Zoster Recombinat (Shingrix) 02/10/2020, 04/09/2020    TDAP status: Up to date  Flu Vaccine status: Declined, Education has been provided regarding the importance of this vaccine but patient still declined. Advised may receive this vaccine at local pharmacy or Health Dept. Aware to provide a copy of the vaccination record if obtained from local pharmacy or Health Dept. Verbalized acceptance and understanding.  Pneumococcal vaccine status: Declined,  Education has been provided regarding the importance of this vaccine but patient still declined. Advised may receive this vaccine at local pharmacy or Health Dept. Aware to provide a copy of the vaccination record if obtained from local pharmacy or Health Dept. Verbalized acceptance and understanding.   Covid-19 vaccine status: Declined, Education has been provided regarding the importance of this vaccine but patient still declined. Advised may receive this vaccine at local pharmacy or Health Dept.or vaccine clinic. Aware to provide a copy of the vaccination record if obtained from local pharmacy or Health Dept. Verbalized acceptance and understanding.  Qualifies for Shingles Vaccine? Yes   Zostavax completed Yes   Shingrix Completed?: Yes  Screening Tests Health Maintenance  Topic Date Due   COVID-19 Vaccine (1) 07/10/2021 (Originally 03/31/1951)   Pneumonia Vaccine 20+ Years old (1 - PCV) 01/17/2022 (Originally 10/01/2014)   MAMMOGRAM  07/08/2021   INFLUENZA VACCINE  08/09/2021   COLONOSCOPY (Pts 45-21yr Insurance coverage will need to be confirmed)  08/23/2022   TETANUS/TDAP  12/21/2022   DEXA SCAN  Completed   Hepatitis C Screening  Completed   Zoster Vaccines- Shingrix  Completed   HPV VACCINES  Aged Out     Health Maintenance  There are no preventive care reminders to display for this patient.   Colorectal cancer screening: Type of screening: Colonoscopy. Completed 08/22/17. Repeat every 5 years  Mammogram status: Completed 07/08/20. Repeat every year  Bone Density status: Completed 05/14/20. Results reflect: Bone density results: OSTEOPOROSIS. Repeat every   years.  Lung Cancer Screening: (Low Dose CT Chest recommended if Age 72-80years, 30 pack-year currently smoking OR have quit w/in 15years.) does not qualify.     Additional Screening:  Hepatitis C Screening: does qualify; Completed 04/27/16  Vision Screening: Recommended annual ophthalmology exams for early detection of glaucoma and other disorders of the eye. Is the patient up to date with their annual eye exam?  Yes  Who is the provider or what is the name of the office in which the patient attends annual eye exams? [facility] If pt is not established with a provider, would they like to be referred to a provider to establish care? No .   Dental Screening: Recommended annual dental exams for proper oral hygiene  Community Resource Referral / Chronic Care Management:  CRR required this visit?  No   CCM required this visit?  No      Plan:     I have personally reviewed and noted the following in the patient's chart:   Medical and social history Use of alcohol, tobacco or illicit drugs  Current medications and supplements including opioid prescriptions.  Functional ability and status Nutritional status Physical activity Advanced directives List of other physicians Hospitalizations, surgeries, and ER visits in previous 12 months Vitals Screenings to include cognitive, depression, and falls Referrals and appointments  In addition, I have reviewed and discussed with patient certain preventive protocols, quality metrics, and best practice recommendations. A written personalized care plan for preventive services as  well as general preventive health recommendations were provided to patient.     Dawn Peaches, LPN   07/28/9468   Nurse Notes: None

## 2021-06-24 NOTE — Patient Instructions (Addendum)
Dawn Reyes , Thank you for taking time to come for your Medicare Wellness Visit. I appreciate your ongoing commitment to your health goals. Please review the following plan we discussed and let me know if I can assist you in the future.   These are the goals we discussed:  Goals       Increase physical activity (pt-stated)      Eating healthy       Manage My Medicine      Timeframe:  Short-Term Goal Priority:  Medium Start Date:                             Expected End Date:                       Follow Up Date 01/07/21    - call for medicine refill 2 or 3 days before it runs out - keep a list of all the medicines I take; vitamins and herbals too - use a pillbox to sort medicine - use an alarm clock or phone to remind me to take my medicine    Why is this important?   These steps will help you keep on track with your medicines.   Notes:       Weight Loss Achieved      Evidence-based guidance:  Review medication that may contribute to weight gain, such as corticosteroid, beta-blocker, tricyclic antidepressant, oral antihyperglycemic; advocate for changes when appropriate.  Perform or refer to registered dietitian to perform comprehensive nutrition assessment that includes disordered-eating behaviors, such as binge-eating, emotional or compulsive eating, grazing.  Counsel patient regarding health risks of obesity and that weight loss goal of 5 to 10 percent of initial weight will improve risk.  Recommend initial weight loss goal of 3 to 5 percent of bodyweight; increase weight-loss goals based on patient success as achieving greater weight loss continues to reduce risk.  Propose a calorie-reduced diet based on the patient's preferences and health status.  Provide ongoing emotional support or cognitive behavioral therapy and dietitian services (individual, group, virtual) over at least 6 months with a minimum of 14 encounters to best facilitate weight loss.  Provide monthly  follow-up for 12 months when weight loss goal is met to assist with maintenance of weight loss.  Encourage increased physical activity or exercise based on individual age, risk, and ability up to 200 to 300 minutes per week that includes aerobic and resistance training.  Encourage reduction in sedentary behaviors by replacing them with nonexercise yet active leisure pursuits.  Identify physical barriers, such as change in posture, balance, gait patterns, joint pain, and environmental barriers to activity.  Consider referral to rehabilitation therapy, especially when mobility or function is impaired due to osteoarthritis and obesity.  Consider referral to weight-loss program that has published evidence of safety and efficacy if on-site intensive intervention is unavailable or patient preference.  Prepare patient for use of pharmacologic therapy as an adjunct to lifestyle changes based on body mass index, patient agreement and presence of risk factors or comorbidities.  Evaluate efficacy of pharmacologic therapy (weight loss) and tolerance to medication periodically.  Engage in shared decision-making regarding referral to bariatric surgeon for consultation and evaluation when weight-loss goal has not been accomplished by behavioral therapy with or without pharmacologic therapy.   Notes:         This is a list of the screening recommended for you and due  dates:  Health Maintenance  Topic Date Due   COVID-19 Vaccine (1) 07/10/2021*   Pneumonia Vaccine (1 - PCV) 01/17/2022*   Mammogram  07/08/2021   Flu Shot  08/09/2021   Colon Cancer Screening  08/23/2022   Tetanus Vaccine  12/21/2022   DEXA scan (bone density measurement)  Completed   Hepatitis C Screening: USPSTF Recommendation to screen - Ages 14-79 yo.  Completed   Zoster (Shingles) Vaccine  Completed   HPV Vaccine  Aged Out  *Topic was postponed. The date shown is not the original due date.    Advanced directives:  Yes  Conditions/risks identified: None  Next appointment: Follow up in one year for your annual wellness visit     Preventive Care 65 Years and Older, Female Preventive care refers to lifestyle choices and visits with your health care provider that can promote health and wellness. What does preventive care include? A yearly physical exam. This is also called an annual well check. Dental exams once or twice a year. Routine eye exams. Ask your health care provider how often you should have your eyes checked. Personal lifestyle choices, including: Daily care of your teeth and gums. Regular physical activity. Eating a healthy diet. Avoiding tobacco and drug use. Limiting alcohol use. Practicing safe sex. Taking low-dose aspirin every day. Taking vitamin and mineral supplements as recommended by your health care provider. What happens during an annual well check? The services and screenings done by your health care provider during your annual well check will depend on your age, overall health, lifestyle risk factors, and family history of disease. Counseling  Your health care provider may ask you questions about your: Alcohol use. Tobacco use. Drug use. Emotional well-being. Home and relationship well-being. Sexual activity. Eating habits. History of falls. Memory and ability to understand (cognition). Work and work Statistician. Reproductive health. Screening  You may have the following tests or measurements: Height, weight, and BMI. Blood pressure. Lipid and cholesterol levels. These may be checked every 5 years, or more frequently if you are over 80 years old. Skin check. Lung cancer screening. You may have this screening every year starting at age 78 if you have a 30-pack-year history of smoking and currently smoke or have quit within the past 15 years. Fecal occult blood test (FOBT) of the stool. You may have this test every year starting at age 98. Flexible sigmoidoscopy  or colonoscopy. You may have a sigmoidoscopy every 5 years or a colonoscopy every 10 years starting at age 70. Hepatitis C blood test. Hepatitis B blood test. Sexually transmitted disease (STD) testing. Diabetes screening. This is done by checking your blood sugar (glucose) after you have not eaten for a while (fasting). You may have this done every 1-3 years. Bone density scan. This is done to screen for osteoporosis. You may have this done starting at age 39. Mammogram. This may be done every 1-2 years. Talk to your health care provider about how often you should have regular mammograms. Talk with your health care provider about your test results, treatment options, and if necessary, the need for more tests. Vaccines  Your health care provider may recommend certain vaccines, such as: Influenza vaccine. This is recommended every year. Tetanus, diphtheria, and acellular pertussis (Tdap, Td) vaccine. You may need a Td booster every 10 years. Zoster vaccine. You may need this after age 50. Pneumococcal 13-valent conjugate (PCV13) vaccine. One dose is recommended after age 43. Pneumococcal polysaccharide (PPSV23) vaccine. One dose is recommended after age  81. Talk to your health care provider about which screenings and vaccines you need and how often you need them. This information is not intended to replace advice given to you by your health care provider. Make sure you discuss any questions you have with your health care provider. Document Released: 01/22/2015 Document Revised: 09/15/2015 Document Reviewed: 10/27/2014 Elsevier Interactive Patient Education  2017 [place] Prevention in the Home Falls can cause injuries. They can happen to people of all ages. There are many things you can do to make your home safe and to help prevent falls. What can I do on the outside of my home? Regularly fix the edges of walkways and driveways and fix any cracks. Remove anything that might make  you trip as you walk through a door, such as a raised step or threshold. Trim any bushes or trees on the path to your home. Use bright outdoor lighting. Clear any walking paths of anything that might make someone trip, such as rocks or tools. Regularly check to see if handrails are loose or broken. Make sure that both sides of any steps have handrails. Any raised decks and porches should have guardrails on the edges. Have any leaves, snow, or ice cleared regularly. Use sand or salt on walking paths during winter. Clean up any spills in your garage right away. This includes oil or grease spills. What can I do in the bathroom? Use night lights. Install grab bars by the toilet and in the tub and shower. Do not use towel bars as grab bars. Use non-skid mats or decals in the tub or shower. If you need to sit down in the shower, use a plastic, non-slip stool. Keep the floor dry. Clean up any water that spills on the floor as soon as it happens. Remove soap buildup in the tub or shower regularly. Attach bath mats securely with double-sided non-slip rug tape. Do not have throw rugs and other things on the floor that can make you trip. What can I do in the bedroom? Use night lights. Make sure that you have a light by your bed that is easy to reach. Do not use any sheets or blankets that are too big for your bed. They should not hang down onto the floor. Have a firm chair that has side arms. You can use this for support while you get dressed. Do not have throw rugs and other things on the floor that can make you trip. What can I do in the kitchen? Clean up any spills right away. Avoid walking on wet floors. Keep items that you use a lot in easy-to-reach places. If you need to reach something above you, use a strong step stool that has a grab bar. Keep electrical cords out of the way. Do not use floor polish or wax that makes floors slippery. If you must use wax, use non-skid floor wax. Do not  have throw rugs and other things on the floor that can make you trip. What can I do with my stairs? Do not leave any items on the stairs. Make sure that there are handrails on both sides of the stairs and use them. Fix handrails that are broken or loose. Make sure that handrails are as long as the stairways. Check any carpeting to make sure that it is firmly attached to the stairs. Fix any carpet that is loose or worn. Avoid having throw rugs at the top or bottom of the stairs. If you do have  throw rugs, attach them to the floor with carpet tape. Make sure that you have a light switch at the top of the stairs and the bottom of the stairs. If you do not have them, ask someone to add them for you. What else can I do to help prevent falls? Wear shoes that: Do not have high heels. Have rubber bottoms. Are comfortable and fit you well. Are closed at the toe. Do not wear sandals. If you use a stepladder: Make sure that it is fully opened. Do not climb a closed stepladder. Make sure that both sides of the stepladder are locked into place. Ask someone to hold it for you, if possible. Clearly mark and make sure that you can see: Any grab bars or handrails. First and last steps. Where the edge of each step is. Use tools that help you move around (mobility aids) if they are needed. These include: Canes. Walkers. Scooters. Crutches. Turn on the lights when you go into a dark area. Replace any light bulbs as soon as they burn out. Set up your furniture so you have a clear path. Avoid moving your furniture around. If any of your floors are uneven, fix them. If there are any pets around you, be aware of where they are. Review your medicines with your doctor. Some medicines can make you feel dizzy. This can increase your chance of falling. Ask your doctor what other things that you can do to help prevent falls. This information is not intended to replace advice given to you by your health care  provider. Make sure you discuss any questions you have with your health care provider. Document Released: 10/22/2008 Document Revised: 06/03/2015 Document Reviewed: 01/30/2014 Elsevier Interactive Patient Education  2017 [employer].

## 2021-07-05 ENCOUNTER — Telehealth: Payer: Self-pay | Admitting: Pharmacist

## 2021-07-05 IMAGING — DX DG KNEE COMPLETE 4+V*L*
4 series · 4 of 4 positions shown · non-contrast
Comparison: None.

CLINICAL DATA: left knee pain

EXAM:
LEFT KNEE - COMPLETE 4+ VIEW

[knee ap]
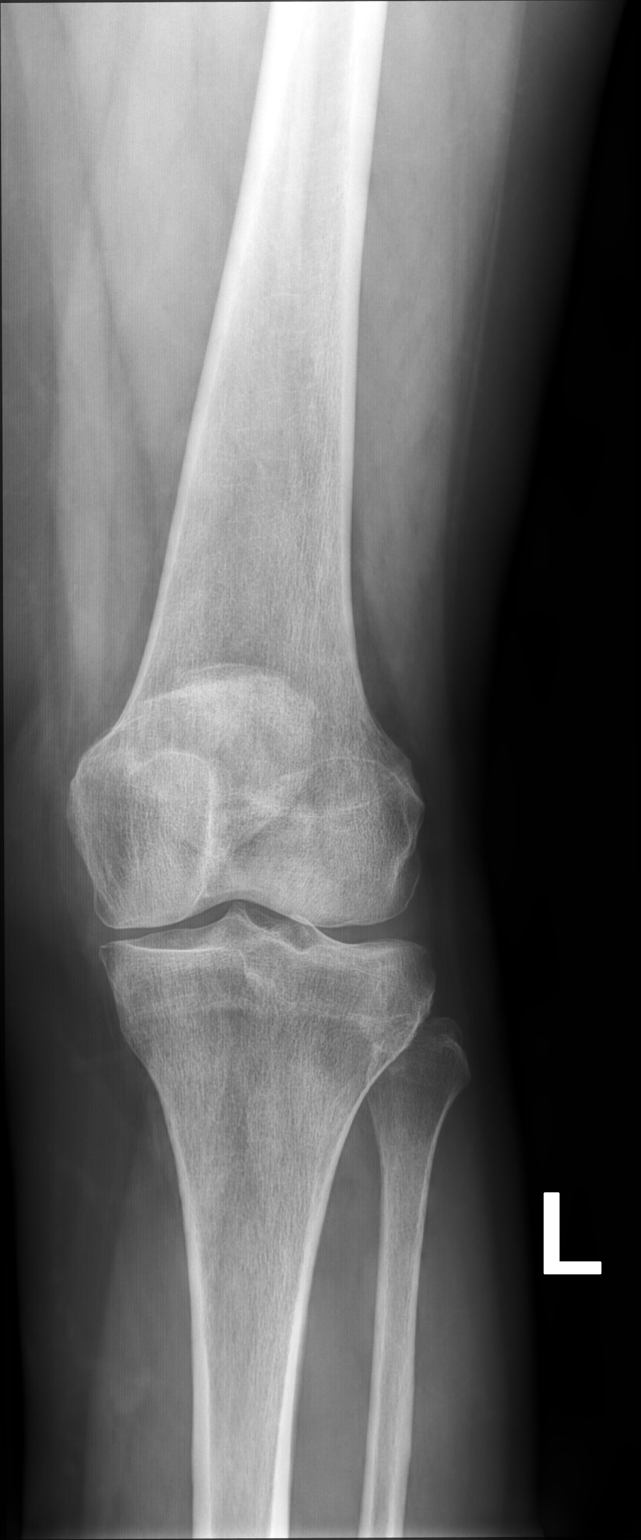

[knee [person_name] view pa]
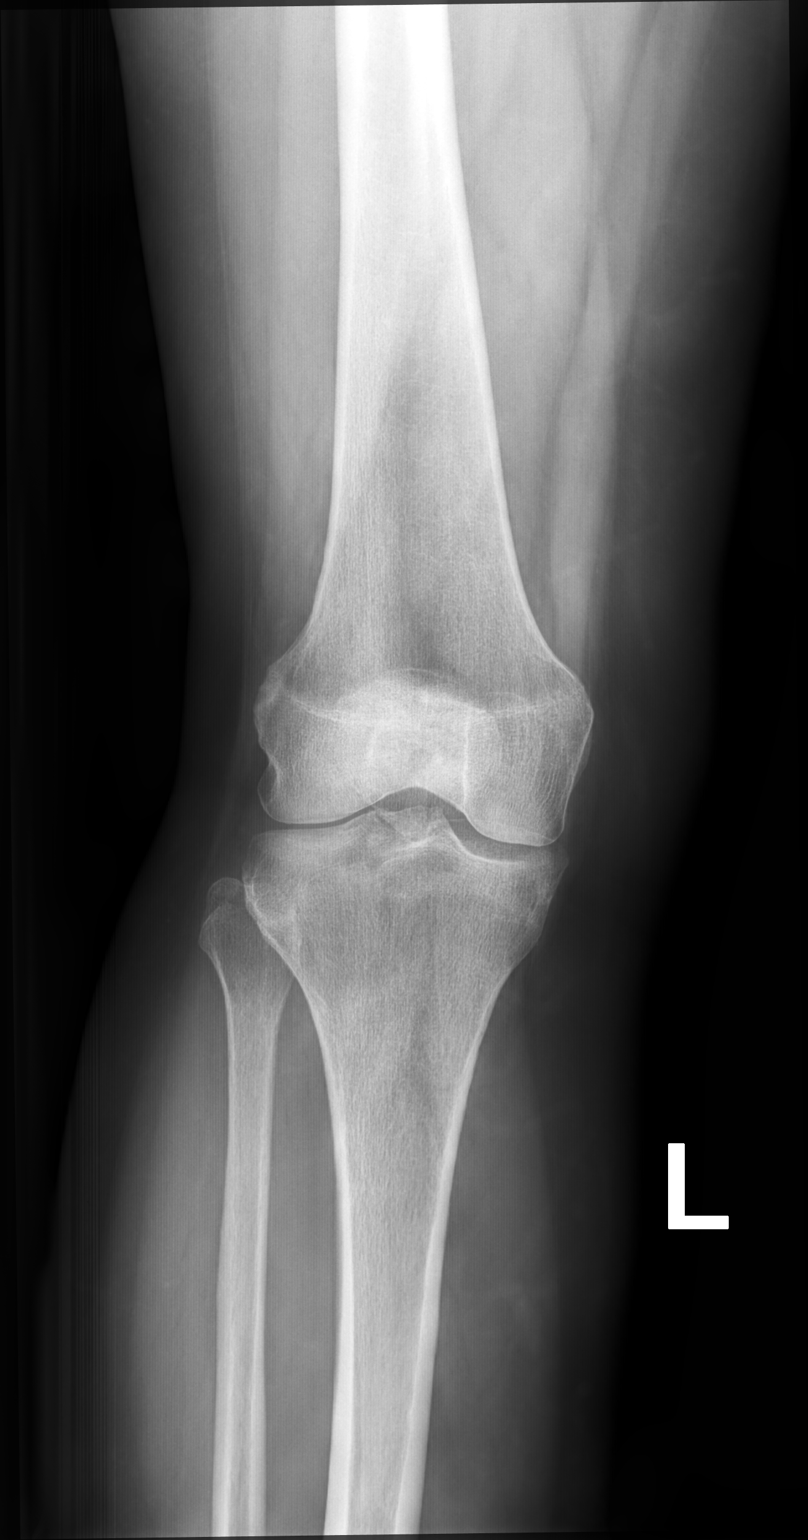

[patella (sunrise) tan]
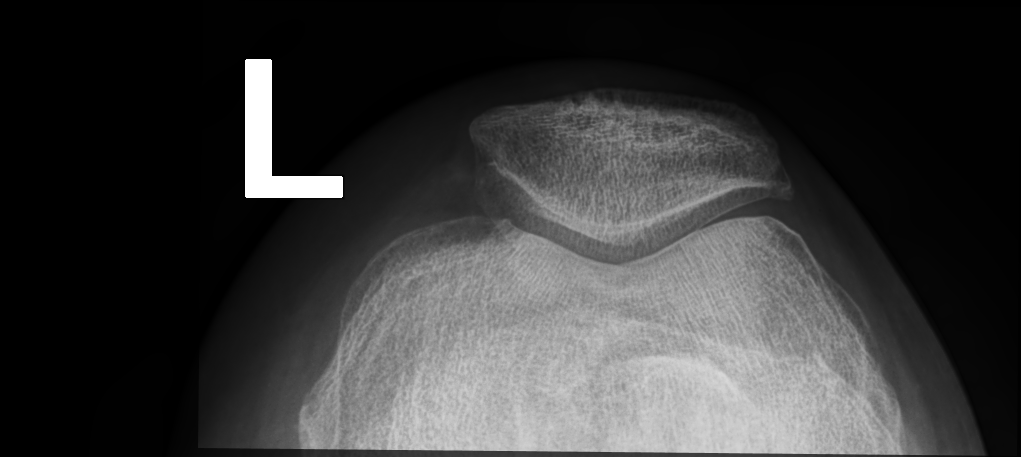

[knee lat]
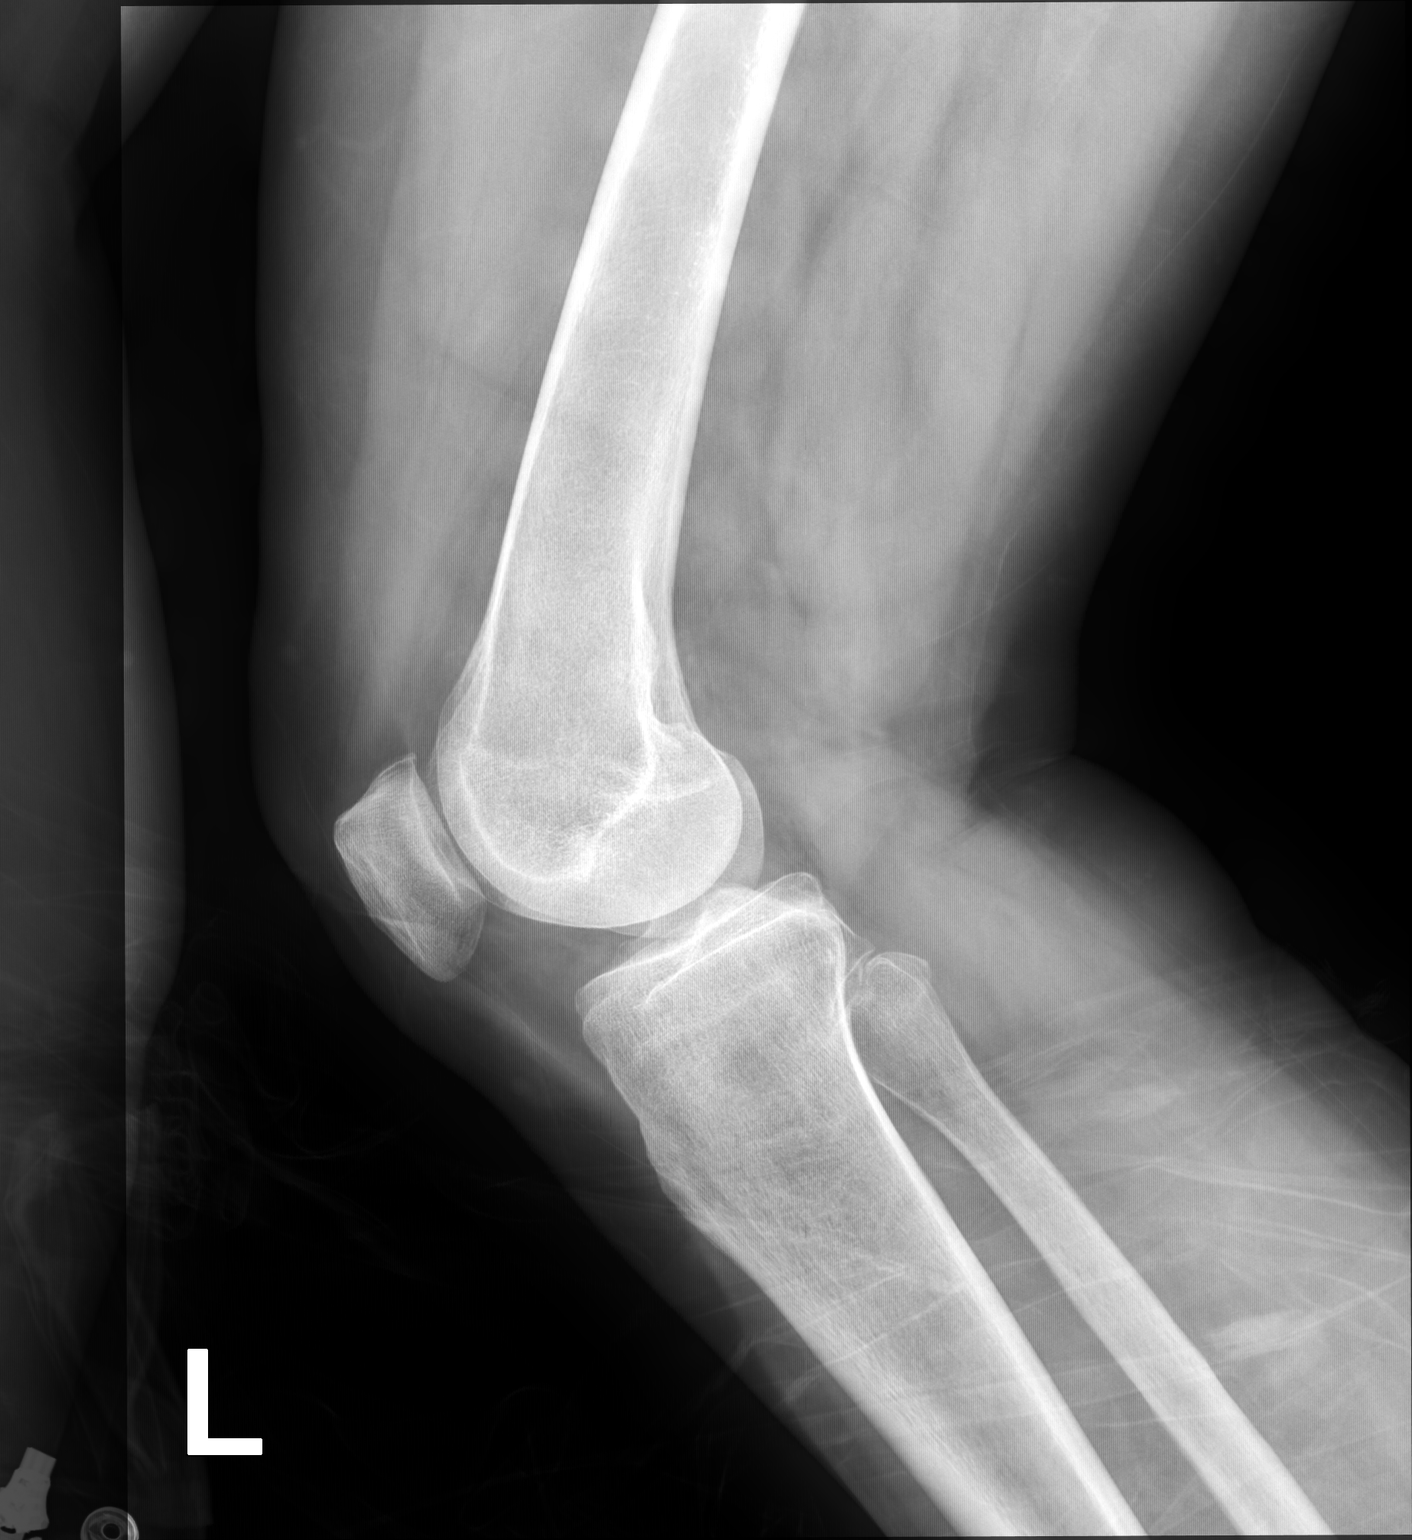

[4 of 4 positions shown; findings below may reference images not displayed]

FINDINGS: Physiologic alignment. No fracture or focal osseous lesion.
Mild-to-moderate tricompartmental joint space loss. Patellofemoral
degenerative spurring. No joint effusion or soft tissue swelling.
IMPRESSION: No acute osseous abnormality.

Mild osteoarthrosis predominantly involving the medial and
patellofemoral compartments.

## 2021-07-05 NOTE — Chronic Care Management (AMB) (Signed)
    Chronic Care Management Pharmacy Assistant   Name: Dawn Reyes  MRN: [id number] DOB: August 28, 1950  07/05/21 APPOINTMENT REMINDER   Called Patient No answer, left message of appointment on 07/06/21 at 2 via telephone visit with [name], Pharm D.   Notified to have all medications, supplements, blood pressure and/or blood sugar logs available during appointment and to return call if need to reschedule.   Care Gaps: BP- 117/70 06/24/21 AWV- 6/23  Star Rating Drug: Rosuvastatin (Crestor) 5 mg - Last filled 06/14/21 90 DS at [facility]      Medications: Outpatient Encounter Medications as of 07/05/2021  Medication Sig   CALCIUM PO Take 600 mg by mouth daily.   cetirizine (ZYRTEC ALLERGY) 10 MG tablet Take 1 tablet (10 mg total) by mouth daily.   Cholecalciferol (VITAMIN D3) 2000 UNITS TABS Take 1 tablet by mouth daily.   gabapentin (NEURONTIN) 300 MG capsule TAKE 3 CAPSULES BY MOUTH TWICE DAILY   hydrochlorothiazide (HYDRODIURIL) 12.5 MG tablet Take 1 tablet (12.5 mg total) by mouth daily.   Multiple Vitamin (MULTIVITAMIN) tablet Take 1 tablet by mouth daily.   potassium chloride (KLOR-CON) 10 MEQ tablet TAKE 1 TABLET EVERY DAY   promethazine-dextromethorphan (PROMETHAZINE-DM) 6.25-15 MG/5ML syrup Take 5 mLs by mouth at bedtime as needed for cough. (Patient not taking: Reported on 05/10/2021)   rosuvastatin (CRESTOR) 5 MG tablet Take 0.5 tablets (2.5 mg total) by mouth daily.   zoledronic acid (RECLAST) 5 MG/100ML SOLN injection Inject 100 mLs (5 mg total) into the vein once.   No facility-administered encounter medications on file as of 07/05/2021.       [facility] Clinical Pharmacist Assistant [phone]

## 2021-07-06 ENCOUNTER — Other Ambulatory Visit: Payer: Self-pay | Admitting: Family

## 2021-07-06 ENCOUNTER — Ambulatory Visit: Payer: Self-pay | Admitting: Pharmacist

## 2021-07-06 DIAGNOSIS — E876 Hypokalemia: Secondary | ICD-10-CM

## 2021-07-06 DIAGNOSIS — I10 Essential (primary) hypertension: Secondary | ICD-10-CM

## 2021-07-06 DIAGNOSIS — E785 Hyperlipidemia, unspecified: Secondary | ICD-10-CM

## 2021-07-06 MED ORDER — POTASSIUM CHLORIDE ER 10 MEQ PO TBCR: 10.0000 meq | EXTENDED_RELEASE_TABLET | Freq: Every day | ORAL | 1 refills | Status: DC

## 2021-07-06 NOTE — Progress Notes (Signed)
Chronic Care Management Pharmacy Note  07/06/2021 Name:  Dawn Reyes MRN:  [id number] DOB:  [birth year]  Summary: LDL at goal < 100 BP at goal < 140/90 per home readings  Recommendations/Changes made from today's visit: -Recommended for patient to continue BP monitoring at home regularly -Requested potassium refill as patient was taking expired medication  Plan: Set up TOC visit with new PCP Follow up BP assessment in 6 months Follow up in 1 year  Subjective: Dawn Reyes is an 71 y.o. year old female who is a primary patient of Koberlein, Steele Berg, MD (Inactive).  The CCM team was consulted for assistance with disease management and care coordination needs.    Engaged with patient by telephone for follow up visit in response to provider referral for pharmacy case management and/or care coordination services.   Consent to Services:  The patient was given information about Chronic Care Management services, agreed to services, and gave verbal consent prior to initiation of services.  Please see initial visit note for detailed documentation.   Patient Care Team: Caren Macadam, MD (Inactive) as PCP - General (Family Medicine) Paula Compton, MD as Attending Physician (Obstetrics and Gynecology) Irene Shipper, MD as Attending Physician (Gastroenterology) Ha[place], Jennefer Bravo, MD as Referring Physician (Dermatology) Viona Gilmore, [facility] as Pharmacist (Pharmacist)  Recent office visits: 06/24/21 Rolene Arbour, LPN: Patient presented for AWV.  04/25/21 Micheline Rough,  MD: Patient presented for left hip bursitis and HTN follow up. Administered injection. Prescribed HCTZ 12.5 mg daily.  01/17/21 Caren Macadam, MD - Patient presented for Essential hypertension benign and other concerns. Prescribed Lisinopril HCTZ. Stopped Benzonatate, HCTZ and Prednisone.  Recent consult visits: 05/25/21 Koren Shiver, RN (infusion services): Patient presented for Reclast  infusion.  05/10/21 Elayne Snare, MD (endocrinology): Patient presented for osteoporosis follow up. Plan for Reclast every other year.  12/16/20 Metta Clines, DO (neurology): Patient presented for burning tongue syndrome. BP elevated. Continued gabapentin 900 mg TID.  11/03/20 Marchia Bond (ortho): Patient presented for foot fracture follow up. Unable to access notes.   02/18/20 Darrick Meigs (Dermatology) - presented to clinic for Melanocytic nevi of trunk inflamed seborrheic keratosis. Destruction of up to 14 lesions performed in office. No medication changes or follow up noted.  Hospital visits: Medication Reconciliation was completed by comparing discharge summary, patient's EMR and Pharmacy list, and upon discussion with patient.   Patient presented to [facility] on 09/[facility]/2020  due to Fracture of base of fifth metatarsal bone. She was present for 1 hour.   New?Medications Started at [facility] Discharge:?? -started  naproxen 500 MG tablet Take 1 tablet (500 mg total) by mouth 2 (two) times daily   Medication Changes at Hospital Discharge: -Changed  None    Medications Discontinued at Hospital Discharge: -Stopped  None    Medications that remain the same after Hospital Discharge:??  -All other medications will remain the same.       Objective:  Lab Results  Component Value Date   CREATININE 0.70 05/06/2021   BUN 15 05/06/2021   GFR 86.90 05/06/2021   NA 139 05/06/2021   K 4.8 05/06/2021   CALCIUM 9.7 05/06/2021   CO2 34 (H) 05/06/2021   GLUCOSE 83 05/06/2021    Lab Results  Component Value Date/Time   HGBA1C 5.6 01/05/2020 11:33 AM   HGBA1C 5.5 12/20/2018 10:57 AM   GFR 86.90 05/06/2021 08:06 AM   GFR 87.39 01/17/2021 10:59 AM  Last diabetic Eye exam: No results found for: "HMDIABEYEEXA"  Last diabetic Foot exam: No results found for: "HMDIABFOOTEX"   Lab Results  Component Value Date   CHOL 176 01/17/2021   HDL 94.90 01/17/2021    LDLCALC 69 01/17/2021   LDLDIRECT 131.7 12/20/2012   TRIG 61.0 01/17/2021   CHOLHDL 2 01/17/2021       Latest Ref Rng & Units 01/17/2021   10:59 AM 07/16/2020    9:27 AM 05/04/2020    8:39 AM  Hepatic Function  Total Protein 6.0 - 8.3 g/dL 7.4  6.7  6.7   Albumin 3.5 - 5.2 g/dL 4.7  4.6  4.2   AST 0 - 37 U/L '18  20  23   ' ALT 0 - 35 U/L '13  13  14   ' Alk Phosphatase 39 - 117 U/L 54  44  41   Total Bilirubin 0.2 - 1.2 mg/dL 0.4  0.6  0.7     Lab Results  Component Value Date/Time   TSH 2.90 12/20/2018 10:57 AM   TSH 2.49 07/30/2012 08:41 AM   FREET4 0.75 07/30/2012 08:41 AM       Latest Ref Rng & Units 01/17/2021   10:59 AM 07/16/2020    9:27 AM 01/05/2020   11:33 AM  CBC  WBC 4.0 - 10.5 K/uL 6.1  5.6  5.0   Hemoglobin 12.0 - 15.0 g/dL 13.7  13.7  13.4   Hematocrit 36.0 - 46.0 % 41.3  40.3  39.7   Platelets 150.0 - 400.0 K/uL 387.0  329.0  322.0     Lab Results  Component Value Date/Time   VD25OH 53.83 01/17/2021 10:59 AM   VD25OH 48.49 05/04/2020 08:39 AM    Clinical ASCVD: No  The 10-year ASCVD risk score (Arnett DK, et al., 2019) is: 10.6%   Values used to calculate the score:     Age: 71 years     Sex: Female     Is Non-Hispanic African American: No     Diabetic: No     Tobacco smoker: No     Systolic Blood Pressure: 389 mmHg     Is BP treated: Yes     HDL Cholesterol: 94.9 mg/dL     Total Cholesterol: 176 mg/dL       06/24/2021    8:21 AM 04/25/2021   10:00 AM 01/17/2021    9:55 AM  Depression screen PHQ 2/9  Decreased Interest 0 0 1  Down, Depressed, Hopeless 0 0 0  PHQ - 2 Score 0 0 1  Altered sleeping 0 3 3  Tired, decreased energy 0 1 1  Change in appetite 0 0 2  Feeling bad or failure about yourself  0 0 1  Trouble concentrating 0 0 0  Moving slowly or fidgety/restless 0 0 0  Suicidal thoughts 0 0 0  PHQ-9 Score 0 4 8  Difficult doing work/chores Not difficult at all Not difficult at all       Social History   Tobacco Use  Smoking Status  Never  Smokeless Tobacco Never   BP Readings from Last 3 Encounters:  06/24/21 117/70  05/25/21 135/80  05/10/21 122/82   Pulse Readings from Last 3 Encounters:  05/25/21 (!) 56  05/10/21 61  04/25/21 70   Wt Readings from Last 3 Encounters:  06/24/21 141 lb (64 kg)  05/25/21 140 lb 12.8 oz (63.9 kg)  05/10/21 141 lb 6.4 oz (64.1 kg)   BMI Readings from Last 3  Encounters:  06/24/21 25.79 kg/m  05/25/21 25.75 kg/m  05/10/21 25.86 kg/m    Assessment/Interventions: Review of patient past medical history, allergies, medications, health status, including review of consultants reports, laboratory and other test data, was performed as part of comprehensive evaluation and provision of chronic care management services.   SDOH:  (Social Determinants of Health) assessments and interventions performed: Yes   SDOH Screenings   Alcohol Screen: Low Risk  (06/24/2021)   Alcohol Screen    Last Alcohol Screening Score (AUDIT): 0  Depression (PHQ2-9): Low Risk  (06/24/2021)   Depression (PHQ2-9)    PHQ-2 Score: 0  Financial Resource Strain: Low Risk  (06/24/2021)   Overall Financial Resource Strain (CARDIA)    Difficulty of Paying Living Expenses: Not very hard  Food Insecurity: No Food Insecurity (06/24/2021)   Hunger Vital Sign    Worried About Running Out of Food in the Last Year: Never true    Ran Out of Food in the Last Year: Never true  Housing: Low Risk  (06/24/2021)   Housing    Last Housing Risk Score: 0  Physical Activity: Sufficiently Active (06/24/2021)   Exercise Vital Sign    Days of Exercise per Week: 4 days    Minutes of Exercise per Session: 60 min  Social Connections: Socially Isolated (06/24/2021)   Social Connection and Isolation Panel [NHANES]    Frequency of Communication with Friends and Family: More than three times a week    Frequency of Social Gatherings with Friends and Family: Three times a week    Attends Religious Services: Never    Active Member of  Clubs or Organizations: No    Attends Archivist Meetings: Patient refused    Marital Status: Widowed  Stress: No Stress Concern Present (06/24/2021)   [place]    Feeling of Stress : Not at all  Tobacco Use: Low Risk  (06/24/2021)   Patient History    Smoking Tobacco Use: Never    Smokeless Tobacco Use: Never    Passive Exposure: Not on file  Transportation Needs: No Transportation Needs (06/24/2021)   PRAPARE - Transportation    Lack of Transportation (Medical): No    Lack of Transportation (Non-Medical): No   CCM Care Plan  Allergies  Allergen Reactions   Vicodin [Hydrocodone-Acetaminophen] Itching    Medications Reviewed Today     Reviewed by Viona Gilmore, [facility] (Pharmacist) on 07/06/21 at [place] List Status: <None>   Medication Order Taking? Sig Documenting Provider Last Dose Status Informant  CALCIUM PO [id number] Yes Take 600 mg by mouth daily. [provider] Taking Active   cetirizine (ZYRTEC ALLERGY) 10 MG tablet [id number]  Take 1 tablet (10 mg total) by mouth daily. Jaynee Eagles, PA-C  Active   Cholecalciferol (VITAMIN D3) 2000 UNITS TABS [id number] Yes Take 1 tablet by mouth daily. [provider] Taking Active Self           Med Note Constance Haw, Joie Bimler Feb 16, 2013 11:01 AM)    gabapentin (NEURONTIN) 300 MG capsule [id number]  TAKE 3 CAPSULES BY MOUTH TWICE DAILY Tomi Likens, Adam R, DO  Active   hydrochlorothiazide (HYDRODIURIL) 12.5 MG tablet [id number] Yes Take 1 tablet (12.5 mg total) by mouth daily. Caren Macadam, MD Taking Active   Multiple Vitamin (MULTIVITAMIN) tablet [id number]  Take 1 tablet by mouth daily. [provider]  Active Self  Med Note Constance Haw, PAMELA C   Sun Feb 16, 2013 11:02 AM)    potassium chloride (KLOR-CON) 10 MEQ tablet [id number]  TAKE 1 TABLET EVERY DAY Koberlein, Junell C, MD  Active   promethazine-dextromethorphan  (PROMETHAZINE-DM) 6.25-15 MG/5ML syrup [id number]  Take 5 mLs by mouth at bedtime as needed for cough.  Patient not taking: Reported on 05/10/2021   Jaynee Eagles, PA-C  Active   rosuvastatin (CRESTOR) 5 MG tablet [id number]  Take 0.5 tablets (2.5 mg total) by mouth daily. Dutch Quint B, FNP  Active   zoledronic acid (RECLAST) 5 MG/100ML SOLN injection [id number] Yes Inject 100 mLs (5 mg total) into the vein once. Elayne Snare, MD Taking Active             Patient Active Problem List   Diagnosis Date Noted   Dyspnea 05/03/2016   Burning tongue syndrome 02/06/2013   Osteoporosis 07/30/2012   Essential hypertension, benign 07/16/2012   Neuralgia 07/16/2012    Immunization History  Administered Date(s) Administered   Tdap 12/20/2012   Zoster Recombinat (Shingrix) 02/10/2020, 04/09/2020   Patient reports her BP readings are 106-125/65-70. Patient has been checking once a week and usually checks when she first gets up.   Patient reports she has been compliant with her other medications but per fill history, potassium has not been filled since Oct 2022. Patient denies missing doses and her current bottle says March 2022 but she was unsure why.   Conditions to be addressed/monitored:  Hypertension, Hyperlipidemia, Osteoporosis, and Pain  Conditions addressed this visit: Hypertension, hyperlipidemia  Care Plan : [place]  Updates made by Viona Gilmore, [place] since 07/06/2021 12:00 AM     Problem: Problem: Hypertension, Hyperlipidemia, Osteoporosis, and Pain      Long-Range Goal: Patient-Specific Goal   Start Date: 06/24/2020  Expected End Date: 06/24/2021  Recent Progress: On track  Priority: High  Note:   Current Barriers:  Unable to independently monitor therapeutic efficacy Unable to achieve control of cholesterol  Unable to maintain control of blood pressure  Pharmacist Clinical Goal(s):  Patient will achieve adherence to monitoring guidelines and medication  adherence to achieve therapeutic efficacy achieve control of cholesterol as evidenced by next lipid panel adhere to plan to optimize therapeutic regimen for cholesterol as evidenced by report of adherence to recommended medication management changes through collaboration with PharmD and provider.   Interventions: 1:1 collaboration with Caren Macadam, MD regarding development and update of comprehensive plan of care as evidenced by provider attestation and co-signature Inter-disciplinary care team collaboration (see longitudinal plan of care) Comprehensive medication review performed; medication list updated in electronic medical record  Hypertension (BP goal <140/90) -Controlled -Current treatment: Hydrochlorothiazide 12.5 mg 1 capsule daily - Appropriate, Effective, Safe, Accessible Potassium ER 10 mEq 1 tablet daily - Appropriate, Effective, Safe, Accessible -Medications previously tried: none  -Current home readings: 106-125/65-70 (checking prior to medications) -Current dietary habits: cooks with salt but does limit some; reads package labels for sodium -Current exercise habits: walking daily -Denies hypotensive/hypertensive symptoms -Educated on Daily salt intake goal < 2300 mg; Exercise goal of 150 minutes per week; Importance of home blood pressure monitoring; Proper BP monitoring technique; -Counseled to monitor BP at home weekly, document, and provide log at future appointments -Counseled on diet and exercise extensively Recommended to continue current medication  Hyperlipidemia: (LDL goal < 100) -Controlled -Current treatment: Rosuvastatin 5 mg 1/2 tablet daily - Appropriate, Effective, Safe, Accessible Krill oil 500 mg 1 capsule daily -  Appropriate, Effective, Safe, Accessible -Medications previously tried: red yeast rice -Current dietary patterns: does not eat out much and tries to limit fried foods -Current exercise habits: walking daily -Educated on Cholesterol  goals;  Benefits of statin for ASCVD risk reduction; Importance of limiting foods high in cholesterol; Exercise goal of 150 minutes per week; -Counseled on diet and exercise extensively Recommended to continue current medication Recommended repeat lipid panel at next office visit.  Osteoporosis (Goal prevent fractures) -Controlled -Last DEXA Scan: 05/2020   T-Score femoral neck: -2.3  T-Score total hip: n/a  T-Score lumbar spine: -0.4  T-Score forearm radius: n/a  10-year probability of major osteoporotic fracture: n/a  10-year probability of hip fracture: n/a -Patient is a candidate for pharmacologic treatment due to T-Score < -2.5 in femoral neck (previous DEXA) -Current treatment  Reclast 5 mg once every 2 years (7 years) - Appropriate, Effective, Safe, Accessible Calcium 600 mg-vitamin D 400 units 1 tablet daily - Appropriate, Effective, Safe, Accessible Vitamin D 2000 units daily - Appropriate, Effective, Safe, Accessible Multivitamin D (300 mg calcium) daily - Appropriate, Effective, Safe, Accessible -Medications previously tried: none  -Recommend [phone] units of vitamin D daily. Recommend 1200 mg of calcium daily from dietary and supplemental sources. Recommend weight-bearing and muscle strengthening exercises for building and maintaining bone density. -Counseled on diet and exercise extensively Recommended to continue current medication  Restless legs syndrome/burning tongue (Goal: minimize symptoms) -Controlled -Current treatment  Gabapentin 300 mg 3 capsules twice daily - Appropriate, Effective, Safe, Accessible -Medications previously tried: none  -Recommended to continue current medication  Health Maintenance -Vaccine gaps: Prevnar 20, COVID (declines) -Current therapy:  Multivitamin 1 tablet daily -Educated on Cost vs benefit of each product must be carefully weighed by individual consumer -Patient is satisfied with current therapy and denies issues -Recommended  to continue current medication  Patient Goals/Self-Care Activities Patient will:  - take medications as prescribed focus on medication adherence by moving all medications to before eating check blood pressure weekly, document, and provide at future appointments target a minimum of 150 minutes of moderate intensity exercise weekly  Follow Up Plan: Telephone follow up appointment with care management team member scheduled for: 1 year      Medication Assistance: None required.  Patient affirms current coverage meets needs.  Compliance/Adherence/Medication fill history: Care Gaps: COVID, Prevnar 20 BP- 117/70 (06/24/21)  Star-Rating Drugs: Rosuvastatin (Crestor) 5 mg - Last filled 06/14/21 90 DS at [facility]   Patient's preferred pharmacy is:  [place], [place] - [address] [place] [address] Phone: [phone] Fax: [phone]  Uses pill box? Yes Pt endorses 90% compliance  We discussed: Current pharmacy is preferred with insurance plan and patient is satisfied with pharmacy services Patient decided to: Continue current medication management strategy  Care Plan and Follow Up Patient Decision:  Patient agrees to Care Plan and Follow-up.  Plan: Telephone follow up appointment with care management team member scheduled for:  1 year  [name], PharmD, [place] Pharmacist [employer] at [place] [phone]

## 2021-07-06 NOTE — Patient Instructions (Addendum)
Hi Ronald,  It was great to catch up with you again! I am glad that your blood pressure is doing so much better.  Don't forget to go ahead and toss your potassium tablets once you get the new supply like we discussed. Also, don't forget to go ahead and take your blood pressure after you've had your medications as this gives them time to work.  Please reach out to me if you have any questions or need anything before our follow up!  Best, Maddie  [name], PharmD, [place] Pharmacist [place] [facility] [place]   Visit Information   Goals Addressed   None    Patient Care Plan: CCM Pharmacy Care Plan     Problem Identified: Problem: Hypertension, Hyperlipidemia, Osteoporosis, and Pain      Long-Range Goal: Patient-Specific Goal   Start Date: 06/24/2020  Expected End Date: 06/24/2021  Recent Progress: On track  Priority: High  Note:   Current Barriers:  Unable to independently monitor therapeutic efficacy Unable to achieve control of cholesterol  Unable to maintain control of blood pressure  Pharmacist Clinical Goal(s):  Patient will achieve adherence to monitoring guidelines and medication adherence to achieve therapeutic efficacy achieve control of cholesterol as evidenced by next lipid panel adhere to plan to optimize therapeutic regimen for cholesterol as evidenced by report of adherence to recommended medication management changes through collaboration with PharmD and provider.   Interventions: 1:1 collaboration with Caren Macadam, MD regarding development and update of comprehensive plan of care as evidenced by provider attestation and co-signature Inter-disciplinary care team collaboration (see longitudinal plan of care) Comprehensive medication review performed; medication list updated in electronic medical record  Hypertension (BP goal <140/90) -Controlled -Current treatment: Hydrochlorothiazide 12.5 mg 1 capsule daily -  Appropriate, Effective, Safe, Accessible Potassium ER 10 mEq 1 tablet daily - Appropriate, Effective, Safe, Accessible -Medications previously tried: none  -Current home readings: 106-125/65-70 (checking prior to medications) -Current dietary habits: cooks with salt but does limit some; reads package labels for sodium -Current exercise habits: walking daily -Denies hypotensive/hypertensive symptoms -Educated on Daily salt intake goal < 2300 mg; Exercise goal of 150 minutes per week; Importance of home blood pressure monitoring; Proper BP monitoring technique; -Counseled to monitor BP at home weekly, document, and provide log at future appointments -Counseled on diet and exercise extensively Recommended to continue current medication  Hyperlipidemia: (LDL goal < 100) -Controlled -Current treatment: Rosuvastatin 5 mg 1/2 tablet daily - Appropriate, Effective, Safe, Accessible Krill oil 500 mg 1 capsule daily - Appropriate, Effective, Safe, Accessible -Medications previously tried: red yeast rice -Current dietary patterns: does not eat out much and tries to limit fried foods -Current exercise habits: walking daily -Educated on Cholesterol goals;  Benefits of statin for ASCVD risk reduction; Importance of limiting foods high in cholesterol; Exercise goal of 150 minutes per week; -Counseled on diet and exercise extensively Recommended to continue current medication Recommended repeat lipid panel at next office visit.  Osteoporosis (Goal prevent fractures) -Controlled -Last DEXA Scan: 05/2020   T-Score femoral neck: -2.3  T-Score total hip: n/a  T-Score lumbar spine: -0.4  T-Score forearm radius: n/a  10-year probability of major osteoporotic fracture: n/a  10-year probability of hip fracture: n/a -Patient is a candidate for pharmacologic treatment due to T-Score < -2.5 in femoral neck (previous DEXA) -Current treatment  Reclast 5 mg once every 2 years (7 years) - Appropriate,  Effective, Safe, Accessible Calcium 600 mg-vitamin D 400 units 1 tablet daily -  Appropriate, Effective, Safe, Accessible Vitamin D 2000 units daily - Appropriate, Effective, Safe, Accessible Multivitamin D (300 mg calcium) daily - Appropriate, Effective, Safe, Accessible -Medications previously tried: none  -Recommend [phone] units of vitamin D daily. Recommend 1200 mg of calcium daily from dietary and supplemental sources. Recommend weight-bearing and muscle strengthening exercises for building and maintaining bone density. -Counseled on diet and exercise extensively Recommended to continue current medication  Restless legs syndrome/burning tongue (Goal: minimize symptoms) -Controlled -Current treatment  Gabapentin 300 mg 3 capsules twice daily - Appropriate, Effective, Safe, Accessible -Medications previously tried: none  -Recommended to continue current medication  Health Maintenance -Vaccine gaps: Prevnar 20, COVID (declines) -Current therapy:  Multivitamin 1 tablet daily -Educated on Cost vs benefit of each product must be carefully weighed by individual consumer -Patient is satisfied with current therapy and denies issues -Recommended to continue current medication  Patient Goals/Self-Care Activities Patient will:  - take medications as prescribed focus on medication adherence by moving all medications to before eating check blood pressure weekly, document, and provide at future appointments target a minimum of 150 minutes of moderate intensity exercise weekly  Follow Up Plan: Telephone follow up appointment with care management team member scheduled for: 1 year       Patient verbalizes understanding of instructions and care plan provided today and agrees to view in [place]. Active MyChart status and patient understanding of how to access instructions and care plan via MyChart confirmed with patient.    Telephone follow up appointment with pharmacy team member scheduled  for: 1 year  Viona Gilmore, [facility]

## 2021-07-07 ENCOUNTER — Telehealth: Payer: Self-pay | Admitting: *Deleted

## 2021-07-07 NOTE — Telephone Encounter (Signed)
Left a detailed message with the information below at the patient's cell number and requested she call to schedule a lab appt.

## 2021-07-07 NOTE — Telephone Encounter (Signed)
-----   Message from Kennyth Arnold, [place] sent at 07/06/2021  5:02 PM EDT ----- Regarding: RE: Potassium refill Med sent but we need to check her potassium this week. She may not need it. I will place a lab order.  [place] ----- Message ----- From: Agnes Lawrence, CMA Sent: 07/06/2021   3:12 PM EDT To: Kennyth Arnold, FNP Subject: FW: Potassium refill                            ----- Message ----- From: Viona Gilmore, [facility] Sent: 07/06/2021   2:42 PM EDT To: Gretchen Portela Subject: Potassium refill                               Hi,  Can you please send a refill of potassium to her [place]? She is almost out.  Thanks! Maddie

## 2021-07-07 NOTE — Telephone Encounter (Signed)
Pt has been sch for lab on 07-08-2021

## 2021-07-08 ENCOUNTER — Other Ambulatory Visit (INDEPENDENT_AMBULATORY_CARE_PROVIDER_SITE_OTHER): Payer: Medicare Other

## 2021-07-08 DIAGNOSIS — E876 Hypokalemia: Secondary | ICD-10-CM | POA: Diagnosis not present

## 2021-07-08 LAB — BASIC METABOLIC PANEL WITH GFR
BUN: 16 mg/dL (ref 6–23)
CO2: 33 meq/L — ABNORMAL HIGH (ref 19–32)
Calcium: 9.6 mg/dL (ref 8.4–10.5)
Chloride: 101 meq/L (ref 96–112)
Creatinine, Ser: 0.81 mg/dL (ref 0.40–1.20)
GFR: 72.85 mL/min (ref >60.00)
Glucose, Bld: 83 mg/dL (ref 70–99)
Potassium: 4.3 meq/L (ref 3.5–5.1)
Sodium: 141 meq/L (ref 135–145)

## 2021-07-11 ENCOUNTER — Ambulatory Visit: Payer: Medicare Other

## 2021-07-18 ENCOUNTER — Ambulatory Visit
Admission: RE | Admit: 2021-07-18 | Discharge: 2021-07-18 | Disposition: A | Discharge status: Home / Self Care | Payer: Medicare Other | Source: Ambulatory Visit | Attending: Family Medicine | Admitting: Family Medicine

## 2021-07-18 DIAGNOSIS — Z1231 Encounter for screening mammogram for malignant neoplasm of breast: Secondary | ICD-10-CM

## 2021-09-06 ENCOUNTER — Encounter: Payer: Self-pay | Admitting: Family Medicine

## 2021-09-06 ENCOUNTER — Ambulatory Visit (INDEPENDENT_AMBULATORY_CARE_PROVIDER_SITE_OTHER): Payer: Medicare Other | Admitting: Family Medicine

## 2021-09-06 DIAGNOSIS — J209 Acute bronchitis, unspecified: Secondary | ICD-10-CM | POA: Insufficient documentation

## 2021-09-06 MED ORDER — METHYLPREDNISOLONE 4 MG PO TBPK: ORAL_TABLET | ORAL | 0 refills | Status: DC

## 2021-09-06 MED ORDER — AZITHROMYCIN 250 MG PO TABS: ORAL_TABLET | ORAL | 0 refills | Status: AC

## 2021-09-06 MED ORDER — ALBUTEROL SULFATE HFA 108 (90 BASE) MCG/ACT IN AERS: 2.0000 | INHALATION_SPRAY | Freq: Four times a day (QID) | RESPIRATORY_TRACT | 0 refills | Status: DC | PRN

## 2021-09-06 NOTE — Progress Notes (Signed)
   Established Patient Office Visit  Subjective   Patient ID: Dawn Reyes, female    DOB: 01/20/1950  Age: 71 y.o. MRN: [id number]  Chief Complaint  Patient presents with   Cough    Productive with white and yellow sputum x2 weeks, tried OTC 12 Mucus Relief and Mucus Relief DM with no relief    Patient reports a 2 week history of cough with mucus production, she reports positive sick contacts, states her granddaughter had similar symptoms last week, she was tested for strep and COVID and was negative for both, She has no prior history of lung disease, she does take zyrtec daily.   Cough This is a new problem. The current episode started 1 to 4 weeks ago. The problem has been unchanged. The problem occurs every few hours. The cough is Productive of sputum. Pertinent negatives include no chest pain, chills, fever, nasal congestion, postnasal drip, rhinorrhea or shortness of breath. She has tried OTC cough suppressant for the symptoms. The treatment provided no relief.      Review of Systems  Constitutional:  Negative for chills and fever.  HENT:  Negative for postnasal drip and rhinorrhea.   Respiratory:  Positive for cough. Negative for shortness of breath.   Cardiovascular:  Negative for chest pain.  All other systems reviewed and are negative.     Objective:     BP (!) 120/90 (BP Location: Left Arm, Patient Position: Sitting, Cuff Size: Normal)   Pulse 85   Temp 98.2 F (36.8 C) (Oral)   Ht '5\' 2"'$  (1.575 m)   Wt 142 lb 6.4 oz (64.6 kg)   SpO2 98%   BMI 26.05 kg/m    Physical Exam HENT:     Right Ear: Tympanic membrane normal.     Left Ear: Tympanic membrane normal.     Mouth/Throat:     Mouth: Mucous membranes are moist.     Pharynx: Oropharynx is clear. No oropharyngeal exudate or posterior oropharyngeal erythema.  Cardiovascular:     Rate and Rhythm: Normal rate and regular rhythm.     Heart sounds: Normal heart sounds.  Pulmonary:     Breath sounds: Wheezing  (inspiratory and expiratory throughout all lung fields) present.  Abdominal:     General: Bowel sounds are normal.  Musculoskeletal:     Cervical back: Normal range of motion and neck supple.      No results found for any visits on 09/06/21.    The 10-year ASCVD risk score (Arnett DK, et al., 2019) is: 11.1%    Assessment & Plan:   Problem List Items Addressed This Visit       Respiratory   Acute bronchitis    Most likely secondary to viral illness, however she has been sick for more than 2 weeks. I recommend albuterol inhalers, medrol dose pak and zithromax for treatment. If she does not improve with this treatment then she will notify us and I will proceed with further work up.      Relevant Medications   methylPREDNISolone (MEDROL DOSEPAK) 4 MG TBPK tablet   albuterol (VENTOLIN HFA) 108 (90 Base) MCG/ACT inhaler   azithromycin (ZITHROMAX) 250 MG tablet    No follow-ups on file.    Farrel Conners, MD

## 2021-09-06 NOTE — Assessment & Plan Note (Signed)
Most likely secondary to viral illness, however she has been sick for more than 2 weeks. I recommend albuterol inhalers, medrol dose pak and zithromax for treatment. If she does not improve with this treatment then she will notify us and I will proceed with further work up.

## 2021-10-24 ENCOUNTER — Encounter: Payer: Medicare Other | Admitting: Family Medicine

## 2021-11-10 ENCOUNTER — Encounter: Payer: Self-pay | Admitting: Family Medicine

## 2021-11-10 ENCOUNTER — Ambulatory Visit (INDEPENDENT_AMBULATORY_CARE_PROVIDER_SITE_OTHER): Payer: Medicare Other

## 2021-11-10 ENCOUNTER — Ambulatory Visit (INDEPENDENT_AMBULATORY_CARE_PROVIDER_SITE_OTHER): Payer: Medicare Other | Admitting: Family Medicine

## 2021-11-10 VITALS — BP 132/78 | HR 70 | Temp 97.5°F | Ht 62.0 in | Wt 147.8 lb

## 2021-11-10 DIAGNOSIS — I10 Essential (primary) hypertension: Secondary | ICD-10-CM | POA: Diagnosis not present

## 2021-11-10 DIAGNOSIS — M2550 Pain in unspecified joint: Secondary | ICD-10-CM | POA: Diagnosis not present

## 2021-11-10 DIAGNOSIS — M25532 Pain in left wrist: Secondary | ICD-10-CM | POA: Diagnosis not present

## 2021-11-10 DIAGNOSIS — M7061 Trochanteric bursitis, right hip: Secondary | ICD-10-CM | POA: Diagnosis not present

## 2021-11-10 DIAGNOSIS — M7062 Trochanteric bursitis, left hip: Secondary | ICD-10-CM | POA: Diagnosis not present

## 2021-11-10 LAB — SEDIMENTATION RATE: Sed Rate: 9 mm/hr (ref 0–30)

## 2021-11-10 LAB — URIC ACID: Uric Acid, Serum: 4.5 mg/dL (ref 2.4–7.0)

## 2021-11-10 MED ORDER — MELOXICAM 15 MG PO TABS: 15.0000 mg | ORAL_TABLET | Freq: Every day | ORAL | 1 refills | Status: DC

## 2021-11-10 NOTE — Progress Notes (Signed)
Established Patient Office Visit  Subjective   Patient ID: Dawn Reyes, female    DOB: 1950-03-16  Age: 71 y.o. MRN: [id number]  Chief Complaint  Patient presents with   Transitions Of Care    Patient is here for follow up visit and transition of care.   HTN -- BP in office performed and is well controlled. She  reports no side effects to the medications, no chest pain, SOB, dizziness or headaches. She has a BP cuff at home and is checking BP regularly, reports they are in the normal range.   HLD- on 1/2 tablet of crestor daily. Denies any side effects to the medication.   Generalized arthritis-- patient is reporting her wrists, fingers are hurting a lot, then also knees and hips, by the ned of the day she has to take aleve to help with the pain. States that tylenol is not very effective in managing her symptoms. States that her hip hurts worse when she lays on that side.    Current Outpatient Medications  Medication Instructions   CALCIUM PO 600 mg, Oral, Daily   cetirizine (ZYRTEC ALLERGY) 10 mg, Oral, Daily   Cholecalciferol (VITAMIN D3) 2000 UNITS TABS 1 tablet, Daily   gabapentin (NEURONTIN) 300 MG capsule TAKE 3 CAPSULES BY MOUTH TWICE DAILY   hydrochlorothiazide (HYDRODIURIL) 12.5 mg, Oral, Daily   meloxicam (MOBIC) 15 mg, Oral, Daily   Multiple Vitamin (MULTIVITAMIN) tablet 1 tablet, Daily   rosuvastatin (CRESTOR) 2.5 mg, Oral, Daily   zoledronic acid (RECLAST) 5 mg, Intravenous,  Once    Patient Active Problem List   Diagnosis Date Noted   Acute bronchitis 09/06/2021   Dyspnea 05/03/2016   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     Objective:     BP 132/78 (BP Location: Left Arm, Patient Position: Sitting, Cuff Size: Normal)   Pulse 70   Temp (!) 97.5 F (36.4 C) (Oral)   Ht '5\' 2"'$  (1.575 m)    Wt 147 lb 12.8 oz (67 kg)   SpO2 98%   BMI 27.03 kg/m    Physical Exam Vitals reviewed.  Constitutional:      Appearance: Normal appearance. She is well-groomed and normal weight.  Eyes:     Conjunctiva/sclera: Conjunctivae normal.  Neck:     Thyroid: No thyromegaly.  Cardiovascular:     Rate and Rhythm: Normal rate and regular rhythm.     Pulses: Normal pulses.     Heart sounds: S1 normal and S2 normal.  Pulmonary:     Effort: Pulmonary effort is normal.     Breath sounds: Normal breath sounds and air entry.  Abdominal:     General: Bowel sounds are normal.  Musculoskeletal:     Right lower leg: No edema.     Left lower leg: No edema.  Neurological:     Mental Status: She is alert and oriented to person, place, and time. Mental status is at baseline.     Gait: Gait is intact.  Psychiatric:        Mood and Affect: Mood and affect normal.        Speech: Speech normal.        Behavior: Behavior normal.        Judgment: Judgment normal.      Results for orders placed or performed in  visit on 11/10/21  Rheumatoid Factor  Result Value Ref Range   Rhuematoid fact SerPl-aCnc <14 <14 IU/mL  Uric Acid  Result Value Ref Range   Uric Acid, Serum 4.5 2.4 - 7.0 mg/dL  Sedimentation Rate  Result Value Ref Range   Sed Rate 9 0 - 30 mm/hr      The 10-year ASCVD risk score (Arnett DK, et al., 2019) is: 15.1%    Assessment & Plan:   Problem List Items Addressed This Visit       Cardiovascular and Mediastinum   Essential hypertension, benign    Current hypertension medications:       Sig   hydrochlorothiazide (HYDRODIURIL) 12.5 MG tablet Take 1 tablet (12.5 mg total) by mouth daily.     BP is well controlled on the current medication listed above, will continue this as prescribed.      Other Visit Diagnoses     Generalized joint pain    -  Primary   Relevant Medications   Will start once daily meloxicam 15 mg for pain management of her arthritis pain. I am  checking labs to rule out inflammatory arthritis as well. Will also check an x-ray of the left wrist to determine the severity of the joint disease.  meloxicam (MOBIC) 15 MG tablet   Other Relevant Orders   Rheumatoid Factor (Completed)   Uric Acid (Completed)   Sedimentation Rate (Completed)   Left wrist pain       Relevant Orders   DG Wrist Complete Left (Completed)   Trochanteric bursitis of both hips       Relevant Orders   Most likely diagnosis, will send to orthopedic surgery for evaluation for possible injections.  Ambulatory referral to Orthopedic Surgery       Return in about 1 month (around 12/10/2021) for follow up.    Farrel Conners, MD

## 2021-11-11 LAB — RHEUMATOID FACTOR: Rheumatoid fact SerPl-aCnc: <14 IU/mL (ref <14)

## 2021-11-11 NOTE — Progress Notes (Signed)
No signs of gout or rheumatoid arthritis, continue with current plan

## 2021-11-14 NOTE — Assessment & Plan Note (Signed)
Current hypertension medications:       Sig   hydrochlorothiazide (HYDRODIURIL) 12.5 MG tablet Take 1 tablet (12.5 mg total) by mouth daily.      BP is well controlled on the current medication listed above, will continue this as prescribed.

## 2021-11-22 DIAGNOSIS — M25551 Pain in right hip: Secondary | ICD-10-CM | POA: Diagnosis not present

## 2021-11-22 DIAGNOSIS — M25552 Pain in left hip: Secondary | ICD-10-CM | POA: Diagnosis not present

## 2021-11-22 DIAGNOSIS — M7062 Trochanteric bursitis, left hip: Secondary | ICD-10-CM | POA: Diagnosis not present

## 2021-11-22 DIAGNOSIS — M7061 Trochanteric bursitis, right hip: Secondary | ICD-10-CM | POA: Diagnosis not present

## 2021-11-22 DIAGNOSIS — M545 Low back pain, unspecified: Secondary | ICD-10-CM | POA: Diagnosis not present

## 2021-12-13 ENCOUNTER — Telehealth: Payer: Self-pay | Admitting: Pharmacist

## 2021-12-13 NOTE — Progress Notes (Signed)
NEUROLOGY FOLLOW UP OFFICE NOTE  Dawn Reyes [id number]  Assessment/Plan:   Restless leg syndrome Burning mouth syndrome    To optimize management of burning mouth discomfort, will increase gabapentin to '900mg'$  in AM, '300mg'$  afternoon and '900mg'$  at bedtime Follow up one year   Subjective:  Dawn Reyes is a 71 year old woman with hypertension, osteoarthritis, depression and fibromyalgia who follows up for restless leg syndrome and burning mouth syndrome.    I  RESTLESS LEG SYNDROME: Update: Current medication:  gabapentin '900mg'$  in afternoon and '900mg'$  at bedtime. Stable at this time.   History: Onset of symptoms occurred in 2013. The symptoms correlate soon after the dental procedure involving 2 canals of 2 of her right-sided upper teeth. She did see the dentist for this, who said that her symptoms are unlikely related.  She notes a "discomfort "on the left side of her face, involving the maxilla to the temple. It is not really a pain. However, she will occasionally have a brief shooting sensation, about a 5/10.  It is sensitive to the touch.  It appears to be sensitive when she is putting on her makeup. She is never in extreme pain however. She also has a burning sensation of her tongue. This also is a 5/10 in intensity. It is less painful when she is eating or drinking.  Lidocaine mouth rinse and ice provide brief relief   II BURNING MOUTH SYNDROME: Update: Current medication:  gabapentin '900mg'$  in afternoon and '900mg'$  at bedtime She is doing well. Sometimes experiences the burning from time to time.   History: She has had it for several years.  It occurs in the evening and in bed.  When she is in bed or sitting still watching TV, she would experience discomfort in the legs that is relieved by movement.  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 07/16/2012   Fibromyalgia    GERD (gastroesophageal reflux disease)    Hematochezia 02/13/2012   Left sided abdominal pain 02/13/2012   Osteoporosis, unspecified 07/30/2012    MEDICATIONS: Current Outpatient Medications on File Prior to Visit  Medication Sig Dispense Refill   CALCIUM PO Take 600 mg by mouth daily.     cetirizine (ZYRTEC ALLERGY) 10 MG tablet Take 1 tablet (10 mg total) by mouth daily. 30 tablet 0   Cholecalciferol (VITAMIN D3) 2000 UNITS TABS Take 1 tablet by mouth daily.     gabapentin (NEURONTIN) 300 MG capsule TAKE 3 CAPSULES BY MOUTH TWICE DAILY 540 capsule 1   hydrochlorothiazide (HYDRODIURIL) 12.5 MG tablet Take 1 tablet (12.5 mg total) by mouth daily. 90 tablet 3   meloxicam (MOBIC) 15 MG tablet Take 1 tablet (15 mg total) by mouth daily. 90 tablet 1   Multiple Vitamin (MULTIVITAMIN) tablet Take 1 tablet by mouth daily.     rosuvastatin (CRESTOR) 5 MG tablet Take 0.5 tablets (2.5 mg total) by mouth daily. 45 tablet 3   zoledronic acid (RECLAST) 5 MG/100ML SOLN injection Inject 100 mLs (5 mg total) into the vein once. 100 mL 0   No current facility-administered medications on file prior to visit.    ALLERGIES: Allergies  Allergen Reactions   Vicodin [Hydrocodone-Acetaminophen] Itching    FAMILY HISTORY: Family History  Problem Relation Age of Onset   Lung  cancer Mother    Arthritis Mother    Hypertension Father    Lung cancer Father    Liver cancer Father    Epilepsy Father    Hypertension Sister    Osteoporosis Sister    Hypertension Brother    Arthritis Brother    Lung cancer Brother 83       small cell   Colon cancer Son 73   Diabetes Neg Hx       Objective:  Blood pressure (!) 154/89, pulse 80, height '5\' 2"'$  (1.575 m), weight 150 lb 3.2 oz (68.1 kg), SpO2 97 %. General: No acute distress.  Patient appears well-groomed.   Head:  Normocephalic/atraumatic Eyes:  Fundi examined but not visualized Neck:  supple, no paraspinal tenderness, full range of motion Heart:  Regular rate and rhythm Neurological Exam: alert and oriented to person, place, and time.  Speech fluent and not dysarthric, language intact.  CN II-XII intact. Bulk and tone normal, muscle strength 5/5 throughout.  Sensation to light touch intact.  Deep tendon reflexes 2+ throughout.  Finger to nose testing intact.  Gait normal, Romberg negative.   Metta Clines, DO  CC: Micheline Rough, MD

## 2021-12-13 NOTE — Progress Notes (Signed)
Chronic Care Management Pharmacy Assistant   Name: Dawn Reyes  MRN: [id number] DOB: 10-21-1950   Reason for Encounter: Disease State   Conditions to be addressed/monitored: HTN  Recent office visits:  11/10/21 Farrel Conners, MD - Patient presented for Generalized joint pain and other concerns. No medication changes.   8/298/23 Farrel Conners, MD - Patient presented for Acute bronchitis unspecified. Prescribed Methylprednisolone, Albuterol and Azithromycin.  Recent consult visits:  07/25/21 - Patient presented to [facility] for Right wrist osteoarthritis and other concerns. No medication changes. Injection administered.  Hospital visits:  None in previous 6 months  Medications: Outpatient Encounter Medications as of 12/13/2021  Medication Sig   CALCIUM PO Take 600 mg by mouth daily.   cetirizine (ZYRTEC ALLERGY) 10 MG tablet Take 1 tablet (10 mg total) by mouth daily.   Cholecalciferol (VITAMIN D3) 2000 UNITS TABS Take 1 tablet by mouth daily.   gabapentin (NEURONTIN) 300 MG capsule TAKE 3 CAPSULES BY MOUTH TWICE DAILY   hydrochlorothiazide (HYDRODIURIL) 12.5 MG tablet Take 1 tablet (12.5 mg total) by mouth daily.   meloxicam (MOBIC) 15 MG tablet Take 1 tablet (15 mg total) by mouth daily.   Multiple Vitamin (MULTIVITAMIN) tablet Take 1 tablet by mouth daily.   rosuvastatin (CRESTOR) 5 MG tablet Take 0.5 tablets (2.5 mg total) by mouth daily.   zoledronic acid (RECLAST) 5 MG/100ML SOLN injection Inject 100 mLs (5 mg total) into the vein once.   No facility-administered encounter medications on file as of 12/13/2021.   Reviewed chart prior to disease state call. Spoke with patient regarding BP  Recent Office Vitals: BP Readings from Last 3 Encounters:  11/10/21 132/78  09/06/21 (!) 120/90  06/24/21 117/70   Pulse Readings from Last 3 Encounters:  11/10/21 70  09/06/21 85  05/25/21 (!) 56    Wt Readings from Last 3 Encounters:  11/10/21 147 lb 12.8 oz (67  kg)  09/06/21 142 lb 6.4 oz (64.6 kg)  06/24/21 141 lb (64 kg)     Kidney Function Lab Results  Component Value Date/Time   CREATININE 0.81 07/08/2021 07:46 AM   CREATININE 0.70 05/06/2021 08:06 AM   GFR 72.85 07/08/2021 07:46 AM       Latest Ref Rng & Units 07/08/2021    7:46 AM 05/06/2021    8:06 AM 01/17/2021   10:59 AM  BMP  Glucose 70 - 99 mg/dL 83  83  94   BUN 6 - 23 mg/dL '16  15  13   '$ Creatinine 0.40 - 1.20 mg/dL 0.81  0.70  0.69   Sodium 135 - 145 mEq/L 141  139  139   Potassium 3.5 - 5.1 mEq/L 4.3  4.8  3.6   Chloride 96 - 112 mEq/L 101  99  95   CO2 19 - 32 mEq/L 33  34  35   Calcium 8.4 - 10.5 mg/dL 9.6  9.7  10.5     Current antihypertensive regimen:  Hydrochlorothiazide 12.5 mg 1 capsule daily - Appropriate, Effective, Safe, Accessible Potassium ER 10 mEq 1 tablet daily - Appropriate, Effective, Safe, Accessible   Unable to reach for assessment   Care Gaps: COVID Booster - Overdue PNA - Postponed Flu Vaccine - Postponed CCM- Need BP- 132/78 11/10/21 AWV- 06/24/21 Lab Results  Component Value Date   HGBA1C 5.6 01/05/2020    Star Rating Drugs: Rosuvastatin (Crestor) 5 mg - Last filled 12/07/21 90 DS at [facility]  [place] Clinical Pharmacist Assistant [phone]

## 2021-12-16 ENCOUNTER — Encounter: Payer: Self-pay | Admitting: Neurology

## 2021-12-16 ENCOUNTER — Ambulatory Visit (INDEPENDENT_AMBULATORY_CARE_PROVIDER_SITE_OTHER): Payer: Medicare Other | Admitting: Neurology

## 2021-12-16 VITALS — BP 154/89 | HR 80 | Ht 62.0 in | Wt 150.2 lb

## 2021-12-16 DIAGNOSIS — K146 Glossodynia: Secondary | ICD-10-CM

## 2021-12-16 DIAGNOSIS — G2581 Restless legs syndrome: Secondary | ICD-10-CM

## 2021-12-16 MED ORDER — GABAPENTIN 300 MG PO CAPS
ORAL_CAPSULE | ORAL | 3 refills | Status: DC
Start: 2021-12-16 — End: 2023-01-26

## 2021-12-16 NOTE — Patient Instructions (Signed)
Take gabapentin 3 pills in morning, 1 pill in afternoon and 3 pills at bedtime Follow up one year.

## 2021-12-29 ENCOUNTER — Ambulatory Visit (INDEPENDENT_AMBULATORY_CARE_PROVIDER_SITE_OTHER): Payer: Medicare Other | Admitting: Family Medicine

## 2021-12-29 ENCOUNTER — Encounter: Payer: Self-pay | Admitting: Family Medicine

## 2021-12-29 VITALS — BP 144/100 | HR 80 | Temp 98.2°F | Ht 62.0 in | Wt 147.8 lb

## 2021-12-29 DIAGNOSIS — J0100 Acute maxillary sinusitis, unspecified: Secondary | ICD-10-CM

## 2021-12-29 MED ORDER — AMOXICILLIN-POT CLAVULANATE 875-125 MG PO TABS: 1.0000 | ORAL_TABLET | Freq: Two times a day (BID) | ORAL | 0 refills | Status: AC

## 2021-12-29 NOTE — Progress Notes (Signed)
Established Patient Office Visit  Subjective   Patient ID: Dawn Reyes, female    DOB: 1950-07-12  Age: 71 y.o. MRN: [id number]  Chief Complaint  Patient presents with   Cough    Productive with yellow sputum x1 week, tried Delsym and Guaifenesin with no relief, tried Tylenol Sinus for the past 5 days     Pt reports that her sinuses started increasing mucus and congestion about 2 weeks ago, states that she went to visit her sister and she was exposed to some URI there but she was already having issues prior to that. Pt reports a fever at home of 100 about 3 days ago. Pt is reporting left sided maxillary pain and also in the left frontal sinus. Lots of pressure on that side also. No ear pain, positive sore throat. A little SOB and mild cough.    Current Outpatient Medications  Medication Instructions   amoxicillin-clavulanate (AUGMENTIN) 875-125 MG tablet 1 tablet, Oral, 2 times daily   CALCIUM PO 600 mg, Oral, Daily   cetirizine (ZYRTEC ALLERGY) 10 mg, Oral, Daily   Cholecalciferol (VITAMIN D3) 2000 UNITS TABS 1 tablet, Daily   gabapentin (NEURONTIN) 300 MG capsule Take 3 capsules in morning, 1 capsule in afternoon and 3 capsules at bedtime   hydrochlorothiazide (HYDRODIURIL) 12.5 mg, Oral, Daily   meloxicam (MOBIC) 15 mg, Oral, Daily   Multiple Vitamin (MULTIVITAMIN) tablet 1 tablet, Daily   rosuvastatin (CRESTOR) 2.5 mg, Oral, Daily   zoledronic acid (RECLAST) 5 mg, Intravenous,  Once     Patient Active Problem List   Diagnosis Date Noted   Acute bronchitis 09/06/2021   Dyspnea 05/03/2016   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     Objective:     BP (!) 144/100 (BP Location: Left Arm, Patient Position: Sitting, Cuff Size: Normal)   Pulse 80   Temp 98.2 F (36.8 C) (Oral)   Ht '5\' 2"'$  (1.575 m)   Wt 147 lb  12.8 oz (67 kg)   SpO2 98%   BMI 27.03 kg/m  BP Readings from Last 3 Encounters:  12/29/21 (!) 144/100  12/16/21 (!) 154/89  11/10/21 132/78      Physical Exam Vitals reviewed.  Constitutional:      Appearance: Normal appearance. She is well-groomed and normal weight.  HENT:     Nose: Congestion present.     Right Sinus: Maxillary sinus tenderness present.     Left Sinus: Maxillary sinus tenderness present.  Eyes:     Conjunctiva/sclera: Conjunctivae normal.  Neck:     Thyroid: No thyromegaly.  Cardiovascular:     Rate and Rhythm: Normal rate and regular rhythm.     Pulses: Normal pulses.     Heart sounds: S1 normal and S2 normal.  Pulmonary:     Effort: Pulmonary effort is normal.     Breath sounds: Normal breath sounds and air entry.  Abdominal:     General: Bowel sounds are normal.  Musculoskeletal:     Right lower leg: No edema.     Left lower leg: No edema.  Neurological:     Mental Status: She is alert and oriented to person, place, and time. Mental status is at baseline.     Gait: Gait is intact.  Psychiatric:        Mood and Affect: Mood  and affect normal.        Speech: Speech normal.        Behavior: Behavior normal.        Judgment: Judgment normal.      No results found for any visits on 12/29/21.    The 10-year ASCVD risk score (Arnett DK, et al., 2019) is: 17.8%    Assessment & Plan:   Problem List Items Addressed This Visit   None Visit Diagnoses     Acute non-recurrent maxillary sinusitis    -  Primary   Relevant Medications   amoxicillin-clavulanate (AUGMENTIN) 875-125 MG tablet  >2 weeks of illness without any improvement, she continues to have significant nasal congestion and symptoms, will treat with a course of augmentin 875 mg BID for 7 days. Continue OTC medications for symptom control.      No follow-ups on file.    Farrel Conners, MD

## 2022-01-05 DIAGNOSIS — M5416 Radiculopathy, lumbar region: Secondary | ICD-10-CM | POA: Diagnosis not present

## 2022-01-22 DIAGNOSIS — M5459 Other low back pain: Secondary | ICD-10-CM | POA: Diagnosis not present

## 2022-01-28 DIAGNOSIS — M5416 Radiculopathy, lumbar region: Secondary | ICD-10-CM | POA: Insufficient documentation

## 2022-02-14 ENCOUNTER — Telehealth: Payer: Self-pay

## 2022-02-14 NOTE — Progress Notes (Signed)
Patient ID: Dawn Reyes, female   DOB: [birth year], 72 y.o.   MRN: [id number] Care Management & Coordination Services Pharmacy Team  Reason for Encounter: Hypertension  Contacted patient to discuss hypertension disease state. Spoke with patient on 02/14/2022     Current antihypertensive regimen:  Hydrochlorothiazide 12.5 mg 1 capsule daily - Appropriate, Effective, Safe, Accessible   Patient verbally confirms she is taking the above medications as directed. Yes  How often are you checking your Blood Pressure? 1-2x per week  she checks her blood pressure in the afternoon after taking her medication.  Current home BP readings:  Patient reports her last 2 readings are 120/60; 130/80 she states she had a headache but it passed quickly and thinks it was due to some strain on her neck she denies any hypotensive symptoms.   Any readings above 180/100? No  What recent interventions/DTPs have been made by any provider to improve Blood Pressure control since last CPP Visit: Patient reports no changes  Any recent hospitalizations or ED visits since last visit with CPP? No  What diet changes have been made to improve Blood Pressure Control?  Patient reports she is monitoring her sodium content  What exercise is being done to improve your Blood Pressure Control?  Patient reports she is walking  Adherence Review: Is the patient currently on ACE/ARB medication? Yes Does the patient have >5 day gap between last estimated fill dates? No    Chart Updates: Recent office visits:  12/29/21 Farrel Conners, MD - Patient presented for Acute non recurrent maxillary sinusitis. Prescribed Amoxicillin.  Recent consult visits:  01/28/22 Eliezer Champagne, MD - Patient presented for back and left leg pain. Injection Administered no medication changes.  01/05/22 Eliezer Champagne, MD - Patient presented for back and left leg pain.Injection Administered no medication changes.  12/16/21 Pieter Partridge, DO (Neurology) - Patient presented for Burning tongue syndrome and other concerns. No medication changes.  Hospital visits:  None in previous 6 months  Medications: Outpatient Encounter Medications as of 02/14/2022  Medication Sig   CALCIUM PO Take 600 mg by mouth daily.   cetirizine (ZYRTEC ALLERGY) 10 MG tablet Take 1 tablet (10 mg total) by mouth daily.   Cholecalciferol (VITAMIN D3) 2000 UNITS TABS Take 1 tablet by mouth daily.   gabapentin (NEURONTIN) 300 MG capsule Take 3 capsules in morning, 1 capsule in afternoon and 3 capsules at bedtime   hydrochlorothiazide (HYDRODIURIL) 12.5 MG tablet Take 1 tablet (12.5 mg total) by mouth daily.   meloxicam (MOBIC) 15 MG tablet Take 1 tablet (15 mg total) by mouth daily.   Multiple Vitamin (MULTIVITAMIN) tablet Take 1 tablet by mouth daily.   rosuvastatin (CRESTOR) 5 MG tablet Take 0.5 tablets (2.5 mg total) by mouth daily.   zoledronic acid (RECLAST) 5 MG/100ML SOLN injection Inject 100 mLs (5 mg total) into the vein once.   No facility-administered encounter medications on file as of 02/14/2022.    Recent Office Vitals: BP Readings from Last 3 Encounters:  12/29/21 (!) 144/100  12/16/21 (!) 154/89  11/10/21 132/78   Pulse Readings from Last 3 Encounters:  12/29/21 80  12/16/21 80  11/10/21 70    Wt Readings from Last 3 Encounters:  12/29/21 147 lb 12.8 oz (67 kg)  12/16/21 150 lb 3.2 oz (68.1 kg)  11/10/21 147 lb 12.8 oz (67 kg)     Kidney Function Lab Results  Component Value Date/Time   CREATININE 0.81 07/08/2021 07:46 AM  CREATININE 0.70 05/06/2021 08:06 AM   GFR 72.85 07/08/2021 07:46 AM       Latest Ref Rng & Units 07/08/2021    7:46 AM 05/06/2021    8:06 AM 01/17/2021   10:59 AM  BMP  Glucose 70 - 99 mg/dL 83  83  94   BUN 6 - 23 mg/dL '16  15  13   '$ Creatinine 0.40 - 1.20 mg/dL 0.81  0.70  0.69   Sodium 135 - 145 mEq/L 141  139  139   Potassium 3.5 - 5.1 mEq/L 4.3  4.8  3.6   Chloride 96 - 112 mEq/L 101   99  95   CO2 19 - 32 mEq/L 33  34  35   Calcium 8.4 - 10.5 mg/dL 9.6  9.7  10.5    Care Gaps: COVID Booster - Overdue PNA Vaccine - Postponed Flu Vaccine - Postponed AWV- 06/24/21    Star Rating Drugs: Rosuvastatin (Crestor) 5 mg - Last filled 12/07/21 90 DS at [place] Pharmacist Assistant [phone]

## 2022-02-21 DIAGNOSIS — M5416 Radiculopathy, lumbar region: Secondary | ICD-10-CM | POA: Diagnosis not present

## 2022-03-13 DIAGNOSIS — L821 Other seborrheic keratosis: Secondary | ICD-10-CM | POA: Diagnosis not present

## 2022-03-13 DIAGNOSIS — L578 Other skin changes due to chronic exposure to nonionizing radiation: Secondary | ICD-10-CM | POA: Diagnosis not present

## 2022-03-13 DIAGNOSIS — Z86018 Personal history of other benign neoplasm: Secondary | ICD-10-CM | POA: Diagnosis not present

## 2022-03-13 DIAGNOSIS — L814 Other melanin hyperpigmentation: Secondary | ICD-10-CM | POA: Diagnosis not present

## 2022-03-13 DIAGNOSIS — D225 Melanocytic nevi of trunk: Secondary | ICD-10-CM | POA: Diagnosis not present

## 2022-05-09 ENCOUNTER — Other Ambulatory Visit: Payer: Self-pay | Admitting: Endocrinology

## 2022-05-09 ENCOUNTER — Other Ambulatory Visit (INDEPENDENT_AMBULATORY_CARE_PROVIDER_SITE_OTHER): Payer: Medicare Other

## 2022-05-09 DIAGNOSIS — M810 Age-related osteoporosis without current pathological fracture: Secondary | ICD-10-CM

## 2022-05-09 DIAGNOSIS — I10 Essential (primary) hypertension: Secondary | ICD-10-CM

## 2022-05-09 LAB — BASIC METABOLIC PANEL
BUN: 8 mg/dL (ref 6–23)
CO2: 32 mEq/L (ref 19–32)
Calcium: 9 mg/dL (ref 8.4–10.5)
Chloride: 102 mEq/L (ref 96–112)
Creatinine, Ser: 0.67 mg/dL (ref 0.40–1.20)
GFR: 87.21 mL/min (ref >60.00)
Glucose, Bld: 89 mg/dL (ref 70–99)
Potassium: 3.7 mEq/L (ref 3.5–5.1)
Sodium: 141 mEq/L (ref 135–145)

## 2022-05-09 LAB — VITAMIN D 25 HYDROXY (VIT D DEFICIENCY, FRACTURES): VITD: 71.58 ng/mL (ref 30.00–100.00)

## 2022-05-11 DIAGNOSIS — R109 Unspecified abdominal pain: Secondary | ICD-10-CM | POA: Insufficient documentation

## 2022-05-11 DIAGNOSIS — B370 Candidal stomatitis: Secondary | ICD-10-CM | POA: Insufficient documentation

## 2022-05-11 DIAGNOSIS — R519 Headache, unspecified: Secondary | ICD-10-CM | POA: Insufficient documentation

## 2022-05-11 DIAGNOSIS — M797 Fibromyalgia: Secondary | ICD-10-CM | POA: Insufficient documentation

## 2022-05-11 DIAGNOSIS — F419 Anxiety disorder, unspecified: Secondary | ICD-10-CM | POA: Insufficient documentation

## 2022-05-11 NOTE — Progress Notes (Signed)
Patient ID: Dawn Reyes, female   DOB: [birth year], 72 y.o.   MRN: [id number]   History of Present Illness:  OSTEOPOROSIS:   Background history includes menopause at the usual age of about 50+.   She was told by her gynecologist in 2006 that she had osteopenia on screening bone density.  She  had a T score of -2.0 at the right right neck of femur but her spine was normal At some point the patient was given a trial of Fosamax which she took for about 2 years but stopped it because of difficulty with lower chest and upper abdominal discomfort when she would take this She also tried this again in 2014 but could not tolerate it for longer than 6 months  Recent history: In 2011 she was found to have a T score of -2.5 at the hip and  subsequently this was -2.7 at the left hip in 2014 She was given Atelvia in 2014 and she took this for 3-4 months and did not follow-up for a couple of years after that  Initial consultation she reported that she had lost only about a half an inch in height since her youth  She was given Reclast on 05/19/15, 05/09/16  05/15/2017, in 06/2018, 06/2019 and 05/25/21  She tolerated all infusions well without side effects  Has occasional mild back pain but no severe pain and no new fracture She did have a fifth metatarsal undisplaced fracture in 9/22 because of her fall Her height is about the same at 5 feet 1.75 inches  She has been taking vitamin D3, possibly 2000 units daily, currently unsure what she is taking, also on multivitamins Her vitamin D levels are consistently normal She takes calcium twice daily   She is now here for annual follow-up  Her bone density data is as follows  04/25/2016 Lumbar spine (L1-L4) Femoral neck (FN) 33% distal radius  T-score -0.6 RFN:-2.4 LFN:-2.4 n/a  Change in BMD from previous DXA test (%) Up 5.6% Up 3.5% n/a    05/14/20 Lumbar spine L1-L4(L3) Femoral neck (FN)  T-score  -0.4 RFN: -2.0 LFN: -2.3  Change in BMD  from previous DXA test (%) Up 1.8% Up 5.3%*    Vitamin D levels:  Lab Results  Component Value Date   VD25OH 71.58 05/09/2022   VD25OH 53.83 01/17/2021   VD25OH 48.49 05/04/2020     Past Medical History:  Diagnosis Date   Anxiety    Benign hypertension    Burning tongue syndrome 02/06/2013   Candidiasis of mouth    Depression and Anxiety - followed by Dr. 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 07/16/2012   Fibromyalgia    GERD (gastroesophageal reflux disease)    Hematochezia 02/13/2012   Left sided abdominal pain 02/13/2012   Osteoporosis, unspecified 07/30/2012    Past Surgical History:  Procedure Laterality Date   ABDOMINAL HYSTERECTOMY     no concern for cancer; menorrhagia   BREAST EXCISIONAL BIOPSY Right 11/14/2002   Radial Scar   FOOT SURGERY Bilateral    mortons neuroma removal   TONSILLECTOMY      Family History  Problem Relation Age of Onset   Lung cancer Mother    Arthritis Mother    Hypertension Father    Lung cancer Father    Liver cancer Father    Epilepsy Father    Hypertension Sister    Osteoporosis Sister    Hypertension Brother    Arthritis Brother    Lung cancer Brother 73  small cell   Colon cancer Son 62   Diabetes Neg Hx     Social History:  reports that she has never smoked. She has never used smokeless tobacco. She reports that she does not drink alcohol and does not use drugs.  Allergies:  Allergies  Allergen Reactions   Vicodin [Hydrocodone-Acetaminophen] Itching    Allergies as of 05/12/2022       Reactions   Vicodin [hydrocodone-acetaminophen] Itching        Medication List        Accurate as of May 12, 2022  4:11 PM. If you have any questions, ask your nurse or doctor.          CALCIUM PO Take 600 mg by mouth daily.   cetirizine 10 MG tablet Commonly known as: ZyrTEC Allergy Take 1 tablet (10 mg total) by mouth daily.   gabapentin 300 MG capsule Commonly known as: NEURONTIN Take 3 capsules in morning, 1 capsule in afternoon  and 3 capsules at bedtime   hydrochlorothiazide 12.5 MG tablet Commonly known as: HYDRODIURIL Take 1 tablet (12.5 mg total) by mouth daily.   multivitamin tablet Take 1 tablet by mouth daily.   rosuvastatin 5 MG tablet Commonly known as: Crestor Take 0.5 tablets (2.5 mg total) by mouth daily.   Vitamin D3 50 MCG (2000 UT) Tabs Take 1 tablet by mouth daily.   zoledronic acid 5 MG/100ML Soln injection Commonly known as: Reclast Inject 100 mLs (5 mg total) into the vein once.         REVIEW OF SYSTEMS:        She is on HCTZ for hypertension  Has normal renal function  Lab Results  Component Value Date   CREATININE 0.67 05/09/2022   BUN 8 05/09/2022   NA 141 05/09/2022   K 3.7 05/09/2022   CL 102 05/09/2022   CO2 32 05/09/2022     EXAM:  BP 126/70 (BP Location: Left Arm, Patient Position: Sitting, Cuff Size: Normal)   Pulse (!) 58   Ht 5' 1.75" (1.568 m)   Wt 141 lb 12.8 oz (64.3 kg)   SpO2 97%   BMI 26.15 kg/m    Kyphosis of the spine no tenderness   ASSESSMENT:   History of osteoporosis, long-standing and asymptomatic Has had insignificant height loss previously and none more recently  She has mild osteoporosis at the left neck of the femur with lowest T score -2.7 in 2014  Subsequently in 2022 lowest T-score was -2.4 although left femur T score was -2.0  She has tolerated Reclast annually, last injection in 05/2021 More recently is getting infusions every other year since she has only osteopenia  No significant height loss or recent fractures  Her vitamin D level is consistently normal with supplements    PLAN:   Bone density will be reordered and if stable or improved we will continue Reclast every other year Next infusion will be in 05/2023  She will check to see how much vitamin D she is taking but likely needs only 1000 units daily instead of 2000 as her recent level is about 72 Continue calcium supplements   Reather Littler 05/12/2022, 4:11  PM

## 2022-05-12 ENCOUNTER — Encounter: Payer: Self-pay | Admitting: Endocrinology

## 2022-05-12 ENCOUNTER — Ambulatory Visit (INDEPENDENT_AMBULATORY_CARE_PROVIDER_SITE_OTHER): Payer: Medicare Other | Admitting: Endocrinology

## 2022-05-12 VITALS — BP 126/70 | HR 58 | Ht 61.75 in | Wt 141.8 lb

## 2022-05-12 DIAGNOSIS — M810 Age-related osteoporosis without current pathological fracture: Secondary | ICD-10-CM

## 2022-05-12 NOTE — Patient Instructions (Addendum)
Vitamin D3 1000 units daily  Bone density

## 2022-05-17 ENCOUNTER — Ambulatory Visit (INDEPENDENT_AMBULATORY_CARE_PROVIDER_SITE_OTHER)
Admission: RE | Admit: 2022-05-17 | Discharge: 2022-05-17 | Disposition: A | Discharge status: Home / Self Care | Payer: Medicare Other | Source: Ambulatory Visit | Attending: Endocrinology | Admitting: Endocrinology

## 2022-05-17 DIAGNOSIS — M810 Age-related osteoporosis without current pathological fracture: Secondary | ICD-10-CM | POA: Diagnosis not present

## 2022-05-30 ENCOUNTER — Ambulatory Visit (INDEPENDENT_AMBULATORY_CARE_PROVIDER_SITE_OTHER): Payer: Medicare Other

## 2022-05-30 ENCOUNTER — Ambulatory Visit (INDEPENDENT_AMBULATORY_CARE_PROVIDER_SITE_OTHER): Payer: Medicare Other | Admitting: Podiatry

## 2022-05-30 DIAGNOSIS — M722 Plantar fascial fibromatosis: Secondary | ICD-10-CM

## 2022-05-30 DIAGNOSIS — M7731 Calcaneal spur, right foot: Secondary | ICD-10-CM

## 2022-05-30 MED ORDER — MELOXICAM 15 MG PO TABS: 15.0000 mg | ORAL_TABLET | Freq: Every day | ORAL | 0 refills | Status: DC | PRN

## 2022-05-30 NOTE — Progress Notes (Signed)
Subjective:   Patient ID: Dawn Reyes, female   DOB: 72 y.o.   MRN: [id number]   HPI Chief Complaint  Patient presents with   Plantar Fasciitis    Patient states onset was 6 months ago heel pain that feels like pins and needles in the heel mostly in the morning. Patient has tried stretching exercises that has not improved the discomfort.      72 year old female presents for above concerns.  Started about 6 months, no recent injuries. She had a fracture 2 years ago on the same foot, but not recently. No treatment. She gets pain in the AM when she gets out of bed and in the evening after walking. No swelling.   She has a history of lumbar radiculopathy.   Review of Systems  All other systems reviewed and are negative.  Past Medical History:  Diagnosis Date   Anxiety    Benign hypertension    Burning tongue syndrome 02/06/2013   Candidiasis of mouth    Depression and Anxiety - followed by Dr. 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 07/16/2012   Fibromyalgia    GERD (gastroesophageal reflux disease)    Hematochezia 02/13/2012   Left sided abdominal pain 02/13/2012   Osteoporosis, unspecified 07/30/2012    Past Surgical History:  Procedure Laterality Date   ABDOMINAL HYSTERECTOMY     no concern for cancer; menorrhagia   BREAST EXCISIONAL BIOPSY Right 11/14/2002   Radial Scar   FOOT SURGERY Bilateral    mortons neuroma removal   TONSILLECTOMY       Current Outpatient Medications:    CALCIUM PO, Take 600 mg by mouth daily., Disp: , Rfl:    cetirizine (ZYRTEC ALLERGY) 10 MG tablet, Take 1 tablet (10 mg total) by mouth daily., Disp: 30 tablet, Rfl: 0   Cholecalciferol (VITAMIN D3) 2000 UNITS TABS, Take 1 tablet by mouth daily., Disp: , Rfl:    gabapentin (NEURONTIN) 300 MG capsule, Take 3 capsules in morning, 1 capsule in afternoon and 3 capsules at bedtime, Disp: 630 capsule, Rfl: 3   hydrochlorothiazide (HYDRODIURIL) 12.5 MG tablet, Take 1 tablet (12.5 mg total) by mouth daily., Disp: 90 tablet, Rfl:  3   Multiple Vitamin (MULTIVITAMIN) tablet, Take 1 tablet by mouth daily., Disp: , Rfl:    rosuvastatin (CRESTOR) 5 MG tablet, Take 0.5 tablets (2.5 mg total) by mouth daily., Disp: 45 tablet, Rfl: 3   zoledronic acid (RECLAST) 5 MG/100ML SOLN injection, Inject 100 mLs (5 mg total) into the vein once., Disp: 100 mL, Rfl: 0  Allergies  Allergen Reactions   Vicodin [Hydrocodone-Acetaminophen] Itching          Objective:  Physical Exam  General: AAO x3, NAD- Wearing [facility]  Dermatological: Skin is warm, dry and supple bilateral. There are no open sores, no preulcerative lesions, no rash or signs of infection present.  Vascular: Dorsalis Pedis artery and Posterior Tibial artery pedal pulses are 2/4 bilateral with immedate capillary fill time. There is no pain with calf compression, swelling, warmth, erythema.   Neruologic: Grossly intact via light touch bilateral.  Negative Tinel sign.  Musculoskeletal: Tenderness to palpation along the plantar medial tubercle of the calcaneus at the insertion of plantar fascia on the right foot. There is no pain along the course of the plantar fascia within the arch of the foot. Plantar fascia appears to be intact. There is no pain with lateral compression of the calcaneus or pain with vibratory sensation. There is no pain along the course or insertion of  the achilles tendon. No other areas of tenderness to bilateral lower extremities. MMT 5/5.   Gait: Unassisted, Nonantalgic.       Assessment:   72 year old female with right heel pain, Plantar fasciitis     Plan:  -Treatment options discussed including all alternatives, risks, and complications -Etiology of symptoms were discussed -X-rays were obtained and reviewed with the patient.  3 views right foot were obtained.  No evidence of acute fracture..  Minimal calcaneal spurring present inferiorly. -Night splint dispensed. Static or dynamic ankle foot orthosis, including soft interface material,  adjustable for fit, for positioning, may be used for minimal ambulation, prefabricated, off-the-shelf was dispensed on the billed date of service  -Plantar fascia brace dispensed help support the plantar fascia during the day -Stretching, icing daily. -Refilled mobic. Discussed side effects of the medication and directed to stop if any are to occur and call the office.   Vivi Barrack DPM

## 2022-05-30 NOTE — Patient Instructions (Signed)
For instructions on how to put on your Night Splint, please visit [email or web address]   Plantar Fasciitis (Heel Spur Syndrome) with Rehab The plantar fascia is a fibrous, ligament-like, soft-tissue structure that spans the bottom of the foot. Plantar fasciitis is a condition that causes pain in the foot due to inflammation of the tissue. SYMPTOMS   Pain and tenderness on the underneath side of the foot.  Pain that worsens with standing or walking. CAUSES  Plantar fasciitis is caused by irritation and injury to the plantar fascia on the underneath side of the foot. Common mechanisms of injury include:  Direct trauma to bottom of the foot.  Damage to a small nerve that runs under the foot where the main fascia attaches to the heel bone.  Stress placed on the plantar fascia due to bone spurs. RISK INCREASES WITH:   Activities that place stress on the plantar fascia (running, jumping, pivoting, or cutting).  Poor strength and flexibility.  Improperly fitted shoes.  Tight calf muscles.  Flat feet.  Failure to warm-up properly before activity.  Obesity. PREVENTION  Warm up and stretch properly before activity.  Allow for adequate recovery between workouts.  Maintain physical fitness:  Strength, flexibility, and endurance.  Cardiovascular fitness.  Maintain a health body weight.  Avoid stress on the plantar fascia.  Wear properly fitted shoes, including arch supports for individuals who have flat feet.  PROGNOSIS  If treated properly, then the symptoms of plantar fasciitis usually resolve without surgery. However, occasionally surgery is necessary.  RELATED COMPLICATIONS   Recurrent symptoms that may result in a chronic condition.  Problems of the lower back that are caused by compensating for the injury, such as limping.  Pain or weakness of the foot during push-off following surgery.  Chronic inflammation, scarring, and partial or complete fascia tear,  occurring more often from repeated injections.  TREATMENT  Treatment initially involves the use of ice and medication to help reduce pain and inflammation. The use of strengthening and stretching exercises may help reduce pain with activity, especially stretches of the Achilles tendon. These exercises may be performed at home or with a therapist. Your caregiver may recommend that you use heel cups of arch supports to help reduce stress on the plantar fascia. Occasionally, corticosteroid injections are given to reduce inflammation. If symptoms persist for greater than 6 months despite non-surgical (conservative), then surgery may be recommended.   MEDICATION   If pain medication is necessary, then nonsteroidal anti-inflammatory medications, such as aspirin and ibuprofen, or other minor pain relievers, such as acetaminophen, are often recommended.  Do not take pain medication within 7 days before surgery.  Prescription pain relievers may be given if deemed necessary by your caregiver. Use only as directed and only as much as you need.  Corticosteroid injections may be given by your caregiver. These injections should be reserved for the most serious cases, because they may only be given a certain number of times.  HEAT AND COLD  Cold treatment (icing) relieves pain and reduces inflammation. Cold treatment should be applied for 10 to 15 minutes every 2 to 3 hours for inflammation and pain and immediately after any activity that aggravates your symptoms. Use ice packs or massage the area with a piece of ice (ice massage).  Heat treatment may be used prior to performing the stretching and strengthening activities prescribed by your caregiver, physical therapist, or athletic trainer. Use a heat pack or soak the injury in warm water.  SEEK IMMEDIATE MEDICAL CARE   IF:  Treatment seems to offer no benefit, or the condition worsens.  Any medications produce adverse side effects.  EXERCISES- RANGE OF  MOTION (ROM) AND STRETCHING EXERCISES - Plantar Fasciitis (Heel Spur Syndrome) These exercises may help you when beginning to rehabilitate your injury. Your symptoms may resolve with or without further involvement from your physician, physical therapist or athletic trainer. While completing these exercises, remember:   Restoring tissue flexibility helps normal motion to return to the joints. This allows healthier, less painful movement and activity.  An effective stretch should be held for at least 30 seconds.  A stretch should never be painful. You should only feel a gentle lengthening or release in the stretched tissue.  RANGE OF MOTION - Toe Extension, Flexion  Sit with your right / left leg crossed over your opposite knee.  Grasp your toes and gently pull them back toward the top of your foot. You should feel a stretch on the bottom of your toes and/or foot.  Hold this stretch for 10 seconds.  Now, gently pull your toes toward the bottom of your foot. You should feel a stretch on the top of your toes and or foot.  Hold this stretch for 10 seconds. Repeat  times. Complete this stretch 3 times per day.   RANGE OF MOTION - Ankle Dorsiflexion, Active Assisted  Remove shoes and sit on a chair that is preferably not on a carpeted surface.  Place right / left foot under knee. Extend your opposite leg for support.  Keeping your heel down, slide your right / left foot back toward the chair until you feel a stretch at your ankle or calf. If you do not feel a stretch, slide your bottom forward to the edge of the chair, while still keeping your heel down.  Hold this stretch for 10 seconds. Repeat 3 times. Complete this stretch 2 times per day.   STRETCH  Gastroc, Standing  Place hands on wall.  Extend right / left leg, keeping the front knee somewhat bent.  Slightly point your toes inward on your back foot.  Keeping your right / left heel on the floor and your knee straight, shift  your weight toward the wall, not allowing your back to arch.  You should feel a gentle stretch in the right / left calf. Hold this position for 10 seconds. Repeat 3 times. Complete this stretch 2 times per day.  STRETCH  Soleus, Standing  Place hands on wall.  Extend right / left leg, keeping the other knee somewhat bent.  Slightly point your toes inward on your back foot.  Keep your right / left heel on the floor, bend your back knee, and slightly shift your weight over the back leg so that you feel a gentle stretch deep in your back calf.  Hold this position for 10 seconds. Repeat 3 times. Complete this stretch 2 times per day.  STRETCH  Gastrocsoleus, Standing  Note: This exercise can place a lot of stress on your foot and ankle. Please complete this exercise only if specifically instructed by your caregiver.   Place the ball of your right / left foot on a step, keeping your other foot firmly on the same step.  Hold on to the wall or a rail for balance.  Slowly lift your other foot, allowing your body weight to press your heel down over the edge of the step.  You should feel a stretch in your right / left calf.  Hold this position   for 10 seconds.  Repeat this exercise with a slight bend in your right / left knee. Repeat 3 times. Complete this stretch 2 times per day.   STRENGTHENING EXERCISES - Plantar Fasciitis (Heel Spur Syndrome)  These exercises may help you when beginning to rehabilitate your injury. They may resolve your symptoms with or without further involvement from your physician, physical therapist or athletic trainer. While completing these exercises, remember:   Muscles can gain both the endurance and the strength needed for everyday activities through controlled exercises.  Complete these exercises as instructed by your physician, physical therapist or athletic trainer. Progress the resistance and repetitions only as guided.  STRENGTH - Towel Curls  Sit in  a chair positioned on a non-carpeted surface.  Place your foot on a towel, keeping your heel on the floor.  Pull the towel toward your heel by only curling your toes. Keep your heel on the floor. Repeat 3 times. Complete this exercise 2 times per day.  STRENGTH - Ankle Inversion  Secure one end of a rubber exercise band/tubing to a fixed object (table, pole). Loop the other end around your foot just before your toes.  Place your fists between your knees. This will focus your strengthening at your ankle.  Slowly, pull your big toe up and in, making sure the band/tubing is positioned to resist the entire motion.  Hold this position for 10 seconds.  Have your muscles resist the band/tubing as it slowly pulls your foot back to the starting position. Repeat 3 times. Complete this exercises 2 times per day.  Document Released: 12/26/2004 Document Revised: 03/20/2011 Document Reviewed: 04/09/2008 [employer] Patient Information 2014 [employer].  

## 2022-06-02 ENCOUNTER — Telehealth: Payer: Self-pay

## 2022-06-02 ENCOUNTER — Other Ambulatory Visit: Payer: Self-pay | Admitting: Family

## 2022-06-02 NOTE — Progress Notes (Unsigned)
  Chronic Care Management   Note  06/02/2022 Name: Dawn Reyes MRN: [id number] DOB: 12/17/1950  Dawn Reyes is a 72 y.o. year old female who is a primary care patient of Karie Georges, MD. I reached out to Dawn Reyes by phone today in response to a referral sent by Ms. Loran Senters PCP.  The first contact attempt was unsuccessful.   Follow up plan: Additional outreach attempts will be made.  Penne Lash, RMA Care Guide [facility]  [place], [place] [address] Direct Dial: [phone] [email or web address]

## 2022-06-05 NOTE — Progress Notes (Signed)
Bone density has slightly improved, still below osteoporosis category and can wait till next year to get the Reclast

## 2022-06-07 NOTE — Progress Notes (Signed)
   Care Guide Note  06/07/2022 Name: Dawn Reyes MRN: [id number] DOB: [birth year]  Referred by: Karie Georges, MD Reason for referral : Care Coordination (Outreach to schedule with Pharm d Elnita Maxwell )   Dawn Reyes is a 72 y.o. year old female who is a primary care patient of Karie Georges, MD. Dawn Reyes was referred to the pharmacist for assistance related to HTN.    Successful contact was made with the patient to discuss pharmacy services including being ready for the pharmacist to call at least 5 minutes before the scheduled appointment time, to have medication bottles and any blood sugar or blood pressure readings ready for review. The patient agreed to meet with the pharmacist via with the pharmacist via telephone visit on (date/time).  06/14/2022  Dawn Reyes, RMA Care Guide [facility]  [place], [place] [address] Direct Dial: [phone] [email or web address]

## 2022-06-09 ENCOUNTER — Other Ambulatory Visit: Payer: Self-pay | Admitting: *Deleted

## 2022-06-09 MED ORDER — ROSUVASTATIN CALCIUM 5 MG PO TABS: 2.5000 mg | ORAL_TABLET | Freq: Every day | ORAL | 0 refills | Status: DC

## 2022-06-09 NOTE — Telephone Encounter (Signed)
Ok to refill 

## 2022-06-14 ENCOUNTER — Other Ambulatory Visit: Payer: Medicare Other

## 2022-06-14 NOTE — Progress Notes (Signed)
06/14/2022 Name: Dawn Reyes MRN: [id number] DOB: 1950/10/03  Chief Complaint  Patient presents with   Medication Management   Subjective: Telephone visit to establish care with [facility] Pharm[facility]; patient previously seen by [facility] pharmacist  Care Team: Primary Care Provider: Karie Georges, MD  Medication Access/Adherence Current Pharmacy:  [facility] [address] [place] - [address] [place] [address] [place] [address] [place] [address] Phone: [phone] Fax: [phone]  -Patient reports affordability concerns with their medications: No  -Patient reports access/transportation concerns to their pharmacy: No  -Patient reports adherence concerns with their medications:  No   -Patient does endorse recent hair thinning/loss over the past few months.  Does not endorse any unexplained changes in weight.  No changes in energy level.  Eats a balance diet and is active.  Taking Biotin and Keratin OTC.  Hypertension: Current medications: hctz 12.5mg  daily  -Patient has a validated, automated, upper arm home BP cuff -Current blood pressure readings readings: 128/77 -Patient denies hypotensive s/sx including dizziness, lightheadedness.  -Patient denies hypertensive symptoms including headache, chest pain, shortness of breath -Current meal patterns: well balanced with protein, carbohydrate, fruit, and vegetable choice- has limited sweets -Current physical activity: does wood working  -Discussed only using meloxicam as needed versus routinely, as this can increase BP.  She states she does wish to limit use and is unsure of efficacy/necessity anyway.  Hyperlipidemia/ASCVD Risk Reduction Current lipid lowering medications: rosuvastatin 2.5mg  daily   Objective: Lab Results  Component Value Date   HGBA1C 5.6 01/05/2020   Lab Results  Component Value Date   CREATININE 0.67 05/09/2022   BUN 8 05/09/2022   NA 141 05/09/2022   K 3.7 05/09/2022   CL 102 05/09/2022   CO2 32  05/09/2022   Lab Results  Component Value Date   CHOL 176 01/17/2021   HDL 94.90 01/17/2021   LDLCALC 69 01/17/2021   LDLDIRECT 131.7 12/20/2012   TRIG 61.0 01/17/2021   CHOLHDL 2 01/17/2021   Medications Reviewed Today     Reviewed by Lenna Gilford, RPH (Pharmacist) on 06/14/22 at 0910  Med List Status: <None>   Medication Order Taking? Sig Documenting Provider Last Dose Status Informant  CALCIUM PO [id number] Yes Take 600 mg by mouth daily. [provider] Taking Active   cetirizine (ZYRTEC ALLERGY) 10 MG tablet [id number] Yes Take 1 tablet (10 mg total) by mouth daily. Wallis Bamberg, PA-C Taking Active   Cholecalciferol (VITAMIN D3) 2000 UNITS TABS [id number] Yes Take 1 tablet by mouth daily. [provider] Taking Active Self           Med Note Henreitta Leber, PAMELA C   Sun Feb 16, 2013 11:01 AM)    gabapentin (NEURONTIN) 300 MG capsule [id number] Yes Take 3 capsules in morning, 1 capsule in afternoon and 3 capsules at bedtime Drema Dallas, DO Taking Active   hydrochlorothiazide (HYDRODIURIL) 12.5 MG tablet [id number] Yes Take 1 tablet (12.5 mg total) by mouth daily. Wynn Banker, MD Taking Active   meloxicam (MOBIC) 15 MG tablet [id number] Yes Take 1 tablet (15 mg total) by mouth daily as needed for pain. Vivi Barrack, DPM Taking Active   Multiple Vitamin (MULTIVITAMIN) tablet [id number] Yes Take 1 tablet by mouth daily. [provider] Taking Active Self           Med Note Henreitta Leber, Elyse Jarvis Feb 16, 2013 11:02 AM)    rosuvastatin (CRESTOR) 5 MG tablet [id number] Yes Take 0.5  tablets (2.5 mg total) by mouth daily. Karie Georges, MD Taking Active   Specialty Vitamins Products (BIOTIN PLUS KERATIN) 10000-100 MCG-MG TABS [id number] Yes Take by mouth. [provider] Taking Active   zoledronic acid (RECLAST) 5 MG/100ML SOLN injection [id number] No Inject 100 mLs (5 mg total) into the vein once.  Patient not taking: Reported on 06/14/2022    Reather Littler, MD Not Taking Active            Assessment/Plan:   Medication Access/Adherence -Patient has not had thyroid labs drawn since 2020, I recommend this at next PCP appointment with recently reported hair thinning/loss  Hypertension: - Currently controlled - Recommend to continue current medication regimen, routine BP monitoring/recording, and regular follow-up with PCP   Hyperlipidemia/ASCVD Risk Reduction: - Currently controlled - Continue current regimen at this time - Recommend lipid levels at next PCP appointment, as last value was 01/27/21  Follow Up Plan: None scheduled at this time but can as needed per PCP  Lenna Gilford, PharmD, DPLA

## 2022-06-15 ENCOUNTER — Other Ambulatory Visit: Payer: Self-pay | Admitting: *Deleted

## 2022-06-15 MED ORDER — HYDROCHLOROTHIAZIDE 12.5 MG PO TABS
12.5000 mg | ORAL_TABLET | Freq: Every day | ORAL | 3 refills | Status: DC
Start: 2022-06-15 — End: 2023-01-25

## 2022-07-03 ENCOUNTER — Ambulatory Visit (INDEPENDENT_AMBULATORY_CARE_PROVIDER_SITE_OTHER): Payer: Medicare Other

## 2022-07-03 VITALS — Ht 62.0 in | Wt 136.0 lb

## 2022-07-03 DIAGNOSIS — Z0000 Encounter for general adult medical examination without abnormal findings: Secondary | ICD-10-CM

## 2022-07-03 NOTE — Progress Notes (Signed)
Subjective:   Dawn Reyes is a 72 y.o. female who presents for Medicare Annual (Subsequent) preventive examination.  Visit Complete: Virtual  I connected with  Dawn Reyes on 07/03/22 by a audio enabled telemedicine application and verified that I am speaking with the correct person using two identifiers.  Patient Location: Home  Provider Location: Home Office  I discussed the limitations of evaluation and management by telemedicine. The patient expressed understanding and agreed to proceed.  Patient Medicare AWV questionnaire was completed by the patient on 07/03/22; I have confirmed that all information answered by patient is correct and no changes since this date.  Review of Systems     Cardiac Risk Factors include: advanced age (>24men, >48 women);hypertension     Objective:    Today's Vitals   07/03/22 0923  Weight: 136 lb (61.7 kg)  Height: 5\' 2"  (1.575 m)   Body mass index is 24.87 kg/m.     07/03/2022    9:30 AM 12/16/2021    8:45 AM 06/24/2021    8:26 AM 05/11/2020    8:24 AM 03/09/2020    8:40 AM 04/27/2016    8:07 AM 08/19/2015    8:50 AM  Advanced Directives  Does Patient Have a Medical Advance Directive? Yes Yes Yes No No Yes Yes  Type of Estate agent of [place];Living will Healthcare Power of [place];Living will Healthcare Power of [place];Living will   Healthcare Power of [place];Living will Living will;Healthcare Power of Attorney  Does patient want to make changes to medical advance directive?   No - Patient declined    No - Patient declined  Copy of Healthcare Power of Attorney in Chart? No - copy requested  No - copy requested   No - copy requested   Would patient like information on creating a medical advance directive?    No - Patient declined No - Patient declined      Current Medications (verified) Outpatient Encounter Medications as of 07/03/2022  Medication Sig   CALCIUM PO Take 600 mg by mouth daily.   cetirizine  (ZYRTEC ALLERGY) 10 MG tablet Take 1 tablet (10 mg total) by mouth daily.   Cholecalciferol (VITAMIN D3) 2000 UNITS TABS Take 1 tablet by mouth daily.   gabapentin (NEURONTIN) 300 MG capsule Take 3 capsules in morning, 1 capsule in afternoon and 3 capsules at bedtime   hydrochlorothiazide (HYDRODIURIL) 12.5 MG tablet Take 1 tablet (12.5 mg total) by mouth daily.   meloxicam (MOBIC) 15 MG tablet Take 1 tablet (15 mg total) by mouth daily as needed for pain.   Multiple Vitamin (MULTIVITAMIN) tablet Take 1 tablet by mouth daily.   rosuvastatin (CRESTOR) 5 MG tablet Take 0.5 tablets (2.5 mg total) by mouth daily.   Specialty Vitamins Products (BIOTIN PLUS KERATIN) 10000-100 MCG-MG TABS Take by mouth.   zoledronic acid (RECLAST) 5 MG/100ML SOLN injection Inject 100 mLs (5 mg total) into the vein once. (Patient not taking: Reported on 06/14/2022)   No facility-administered encounter medications on file as of 07/03/2022.    Allergies (verified) Vicodin [hydrocodone-acetaminophen]   History: Past Medical History:  Diagnosis Date   Anxiety    Benign hypertension    Burning tongue syndrome 02/06/2013   Candidiasis of mouth    Depression and Anxiety - followed by Dr. 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 07/16/2012   Fibromyalgia    GERD (gastroesophageal reflux disease)    Hematochezia 02/13/2012   Left sided abdominal pain 02/13/2012   Osteoporosis, unspecified 07/30/2012  Past Surgical History:  Procedure Laterality Date   ABDOMINAL HYSTERECTOMY     no concern for cancer; menorrhagia   BREAST EXCISIONAL BIOPSY Right 11/14/2002   Radial Scar   FOOT SURGERY Bilateral    mortons neuroma removal   TONSILLECTOMY     Family History  Problem Relation Age of Onset   Lung cancer Mother    Arthritis Mother    Hypertension Father    Lung cancer Father    Liver cancer Father    Epilepsy Father    Hypertension Sister    Osteoporosis Sister    Hypertension Brother    Arthritis Brother    Lung cancer Brother 75        small cell   Colon cancer Son 50   Diabetes Neg Hx    Social History   Socioeconomic History   Marital status: Widowed    Spouse name: Not on file   Number of children: 2   Years of education: Not on file   Highest education level: 12th grade  Occupational History   Occupation: CSR    Employer: [employer]  Tobacco Use   Smoking status: Never   Smokeless tobacco: Never  Vaping Use   Vaping Use: Never used  Substance and Sexual Activity   Alcohol use: No    Alcohol/week: 0.0 standard drinks of alcohol   Drug use: No   Sexual activity: Never  Other Topics Concern   Not on file  Social History Narrative   Work or School: [employer]      Home Situation: lives with husband      Spiritual Beliefs: Baptist      Lifestyle: no regular exercise; diet is ok               Social Determinants of Corporate investment banker Strain: Low Risk  (07/03/2022)   Overall Financial Resource Strain (CARDIA)    Difficulty of Paying Living Expenses: Not hard at all  Food Insecurity: No Food Insecurity (07/03/2022)   Hunger Vital Sign    Worried About Running Out of Food in the Last Year: Never true    Ran Out of Food in the Last Year: Never true  Transportation Needs: No Transportation Needs (07/03/2022)   PRAPARE - Administrator, Civil Service (Medical): No    Lack of Transportation (Non-Medical): No  Physical Activity: Insufficiently Active (07/03/2022)   Exercise Vital Sign    Days of Exercise per Week: 3 days    Minutes of Exercise per Session: 30 min  Stress: No Stress Concern Present (07/03/2022)   [employer] of Occupational Health - Occupational Stress Questionnaire    Feeling of Stress : Not at all  Social Connections: Moderately Integrated (07/03/2022)   Social Connection and Isolation Panel [NHANES]    Frequency of Communication with Friends and Family: More than three times a week    Frequency of Social Gatherings with Friends  and Family: More than three times a week    Attends Religious Services: More than 4 times per year    Active Member of [employer] or Organizations: Yes    Attends Banker Meetings: More than 4 times per year    Marital Status: Widowed    Tobacco Counseling Counseling given: Not Answered   Clinical Intake:  Pre-visit preparation completed: Yes  Pain : No/denies pain     BMI - recorded: 24.87 Nutritional Status: BMI of 19-24  Normal Nutritional Risks:  None Diabetes: No  How often do you need to have someone help you when you read instructions, pamphlets, or other written materials from your doctor or pharmacy?: 1 - Never  Interpreter Needed?: No  Information entered by :: Theresa Mulligan LPN   Activities of Daily Living    07/03/2022    9:29 AM 07/03/2022    8:10 AM  In your present state of health, do you have any difficulty performing the following activities:  Hearing? 0 0  Vision? 0 0  Difficulty concentrating or making decisions? 0 0  Walking or climbing stairs? 0 0  Dressing or bathing? 0 0  Doing errands, shopping? 0 0  Preparing Food and eating ? N N  Using the Toilet? N N  In the past six months, have you accidently leaked urine? N N  Do you have problems with loss of bowel control? N N  Managing your Medications? N N  Managing your Finances? N N  Housekeeping or managing your Housekeeping? N N    Patient Care Team: Karie Georges, MD as PCP - General (Family Medicine) Huel Cote, MD as Attending Physician (Obstetrics and Gynecology) Hilarie Fredrickson, MD as Attending Physician (Gastroenterology) [place], Elvin So, MD as Referring Physician (Dermatology) Verner Chol, [facility] (Inactive) as Pharmacist (Pharmacist)  Indicate any recent Medical Services you may have received from other than [facility] providers in the past year (date may be approximate).     Assessment:   This is a routine wellness examination for [place].  Hearing/Vision  screen Hearing Screening - Comments:: Denies hearing difficulties   Vision Screening - Comments:: Wears rx glasses - up to date with routine eye exams with  [facility]  Dietary issues and exercise activities discussed:     Goals Addressed               This Visit's Progress     Lose more weight (pt-stated)         Depression Screen    07/03/2022    9:28 AM 12/29/2021   10:54 AM 06/24/2021    8:21 AM 04/25/2021   10:00 AM 01/17/2021    9:55 AM 05/11/2020    8:22 AM 01/05/2020   10:41 AM  PHQ 2/9 Scores  PHQ - 2 Score 0 0 0 0 1 0 0  PHQ- 9 Score  0 0 4 8      Fall Risk    07/03/2022    9:29 AM 07/03/2022    8:10 AM 12/16/2021    8:45 AM 06/24/2021    8:24 AM 06/24/2021    6:45 AM  Fall Risk   Falls in the past year? 0 0 0 1 1  Number falls in past yr: 0 0 0 0 0  Injury with Fall? 0 0 0 1 1  Comment    Fx Foot. Followed by Orthopedic   Risk for fall due to : No Fall Risks   Orthopedic patient   Follow up Falls prevention discussed  Falls evaluation completed      MEDICARE RISK AT HOME:  Medicare Risk at Home - 07/03/22 0934     Any stairs in or around the home? No    If so, are there any without handrails? No    Home free of loose throw rugs in walkways, pet beds, electrical cords, etc? Yes    Adequate lighting in your home to reduce risk of falls? Yes    Life alert? No  Use of a cane, walker or w/c? No    Grab bars in the bathroom? No    Shower chair or bench in shower? No    Elevated toilet seat or a handicapped toilet? Yes             TIMED UP AND GO:  Was the test performed?  No    Cognitive Function:        07/03/2022    9:30 AM 06/24/2021    8:27 AM  6CIT Screen  What Year? 0 points 0 points  What month? 0 points 0 points  What time? 0 points 0 points  Count back from 20 0 points 0 points  Months in reverse 0 points 0 points  Repeat phrase 0 points 0 points  Total Score 0 points 0 points    Immunizations Immunization History   Administered Date(s) Administered   Tdap 12/20/2012   Zoster Recombinat (Shingrix) 02/10/2020, 04/09/2020    TDAP status: Up to date  Flu Vaccine status: Up to date  Pneumococcal vaccine status: Declined,  Education has been provided regarding the importance of this vaccine but patient still declined. Advised may receive this vaccine at local pharmacy or Health Dept. Aware to provide a copy of the vaccination record if obtained from local pharmacy or Health Dept. Verbalized acceptance and understanding.   Covid-19 vaccine status: Declined, Education has been provided regarding the importance of this vaccine but patient still declined. Advised may receive this vaccine at local pharmacy or Health Dept.or vaccine clinic. Aware to provide a copy of the vaccination record if obtained from local pharmacy or Health Dept. Verbalized acceptance and understanding.  Qualifies for Shingles Vaccine? Yes   Zostavax completed Yes   Shingrix Completed?: Yes  Screening Tests Health Maintenance  Topic Date Due   Colonoscopy  08/23/2022   COVID-19 Vaccine (1) 07/19/2022 (Originally 10/01/1955)   Pneumonia Vaccine 68+ Years old (1 of 1 - PCV) 07/03/2023 (Originally 10/01/2014)   MAMMOGRAM  07/19/2022   INFLUENZA VACCINE  08/10/2022   DTaP/Tdap/Td (2 - Td or Tdap) 12/21/2022   Medicare Annual Wellness (AWV)  07/03/2023   DEXA SCAN  Completed   Hepatitis C Screening  Completed   Zoster Vaccines- Shingrix  Completed   HPV VACCINES  Aged Out    Health Maintenance  Health Maintenance Due  Topic Date Due   Colonoscopy  08/23/2022    Colorectal cancer screening: Type of screening: Colonoscopy. Completed 08/22/17. Repeat every 5 years  Mammogram status: Completed 07/18/21. Repeat every year  Bone Density status: Completed 05/17/22. Results reflect: Bone density results: OSTEOPOROSIS. Repeat every   years.  Lung Cancer Screening: (Low Dose CT Chest recommended if Age 55-80 years, 20 pack-year currently  smoking OR have quit w/in 15years.) does not qualify.    Additional Screening:  Hepatitis C Screening: does qualify; Completed 04/27/16  Vision Screening: Recommended annual ophthalmology exams for early detection of glaucoma and other disorders of the eye. Is the patient up to date with their annual eye exam?  Yes  Who is the provider or what is the name of the office in which the patient attends annual eye exams? [facility] If pt is not established with a provider, would they like to be referred to a provider to establish care? No .   Dental Screening: Recommended annual dental exams for proper oral hygiene   Community Resource Referral / Chronic Care Management:  CRR required this visit?  No   CCM required this visit?  No     Plan:     I have personally reviewed and noted the following in the patient's chart:   Medical and social history Use of alcohol, tobacco or illicit drugs  Current medications and supplements including opioid prescriptions. Patient is not currently taking opioid prescriptions. Functional ability and status Nutritional status Physical activity Advanced directives List of other physicians Hospitalizations, surgeries, and ER visits in previous 12 months Vitals Screenings to include cognitive, depression, and falls Referrals and appointments  In addition, I have reviewed and discussed with patient certain preventive protocols, quality metrics, and best practice recommendations. A written personalized care plan for preventive services as well as general preventive health recommendations were provided to patient.     Tillie Rung, LPN   [id number]   After Visit Summary: (MyChart) Due to this being a telephonic visit, the after visit summary with patients personalized plan was offered to patient via MyChart   Nurse Notes: Patient request f/u with concerns of increase hair loss.

## 2022-07-03 NOTE — Patient Instructions (Addendum)
Ms. Dawn Reyes , Thank you for taking time to come for your Medicare Wellness Visit. I appreciate your ongoing commitment to your health goals. Please review the following plan we discussed and let me know if I can assist you in the future.   These are the goals we discussed:  Goals       Increase physical activity (pt-stated)      Eating healthy       Lose more weight (pt-stated)      Manage My Medicine      Timeframe:  Short-Term Goal Priority:  Medium Start Date:                             Expected End Date:                       Follow Up Date 01/07/21    - call for medicine refill 2 or 3 days before it runs out - keep a list of all the medicines I take; vitamins and herbals too - use a pillbox to sort medicine - use an alarm clock or phone to remind me to take my medicine    Why is this important?   These steps will help you keep on track with your medicines.   Notes:       Weight Loss Achieved      Evidence-based guidance:  Review medication that may contribute to weight gain, such as corticosteroid, beta-blocker, tricyclic antidepressant, oral antihyperglycemic; advocate for changes when appropriate.  Perform or refer to registered dietitian to perform comprehensive nutrition assessment that includes disordered-eating behaviors, such as binge-eating, emotional or compulsive eating, grazing.  Counsel patient regarding health risks of obesity and that weight loss goal of 5 to 10 percent of initial weight will improve risk.  Recommend initial weight loss goal of 3 to 5 percent of bodyweight; increase weight-loss goals based on patient success as achieving greater weight loss continues to reduce risk.  Propose a calorie-reduced diet based on the patient's preferences and health status.  Provide ongoing emotional support or cognitive behavioral therapy and dietitian services (individual, group, virtual) over at least 6 months with a minimum of 14 encounters to best facilitate  weight loss.  Provide monthly follow-up for 12 months when weight loss goal is met to assist with maintenance of weight loss.  Encourage increased physical activity or exercise based on individual age, risk, and ability up to 200 to 300 minutes per week that includes aerobic and resistance training.  Encourage reduction in sedentary behaviors by replacing them with nonexercise yet active leisure pursuits.  Identify physical barriers, such as change in posture, balance, gait patterns, joint pain, and environmental barriers to activity.  Consider referral to rehabilitation therapy, especially when mobility or function is impaired due to osteoarthritis and obesity.  Consider referral to weight-loss program that has published evidence of safety and efficacy if on-site intensive intervention is unavailable or patient preference.  Prepare patient for use of pharmacologic therapy as an adjunct to lifestyle changes based on body mass index, patient agreement and presence of risk factors or comorbidities.  Evaluate efficacy of pharmacologic therapy (weight loss) and tolerance to medication periodically.  Engage in shared decision-making regarding referral to bariatric surgeon for consultation and evaluation when weight-loss goal has not been accomplished by behavioral therapy with or without pharmacologic therapy.   Notes:         This is a  list of the screening recommended for you and due dates:  Health Maintenance  Topic Date Due   Colon Cancer Screening  08/23/2022   COVID-19 Vaccine (1) 07/19/2022*   Pneumonia Vaccine (1 of 1 - PCV) 07/03/2023*   Mammogram  07/19/2022   Flu Shot  08/10/2022   DTaP/Tdap/Td vaccine (2 - Td or Tdap) 12/21/2022   Medicare Annual Wellness Visit  07/03/2023   DEXA scan (bone density measurement)  Completed   Hepatitis C Screening  Completed   Zoster (Shingles) Vaccine  Completed   HPV Vaccine  Aged Out  *Topic was postponed. The date shown is not the original due  date.    Advanced directives: Please bring a copy of your health care power of attorney and living will to the office to be added to your chart at your convenience.   Conditions/risks identified: None  Next appointment: Follow up in one year for your annual wellness visit    Preventive Care 65 Years and Older, Female Preventive care refers to lifestyle choices and visits with your health care provider that can promote health and wellness. What does preventive care include? A yearly physical exam. This is also called an annual well check. Dental exams once or twice a year. Routine eye exams. Ask your health care provider how often you should have your eyes checked. Personal lifestyle choices, including: Daily care of your teeth and gums. Regular physical activity. Eating a healthy diet. Avoiding tobacco and drug use. Limiting alcohol use. Practicing safe sex. Taking low-dose aspirin every day. Taking vitamin and mineral supplements as recommended by your health care provider. What happens during an annual well check? The services and screenings done by your health care provider during your annual well check will depend on your age, overall health, lifestyle risk factors, and family history of disease. Counseling  Your health care provider may ask you questions about your: Alcohol use. Tobacco use. Drug use. Emotional well-being. Home and relationship well-being. Sexual activity. Eating habits. History of falls. Memory and ability to understand (cognition). Work and work Astronomer. Reproductive health. Screening  You may have the following tests or measurements: Height, weight, and BMI. Blood pressure. Lipid and cholesterol levels. These may be checked every 5 years, or more frequently if you are over 57 years old. Skin check. Lung cancer screening. You may have this screening every year starting at age 12 if you have a 30-pack-year history of smoking and currently smoke  or have quit within the past 15 years. Fecal occult blood test (FOBT) of the stool. You may have this test every year starting at age 41. Flexible sigmoidoscopy or colonoscopy. You may have a sigmoidoscopy every 5 years or a colonoscopy every 10 years starting at age 51. Hepatitis C blood test. Hepatitis B blood test. Sexually transmitted disease (STD) testing. Diabetes screening. This is done by checking your blood sugar (glucose) after you have not eaten for a while (fasting). You may have this done every 1-3 years. Bone density scan. This is done to screen for osteoporosis. You may have this done starting at age 71. Mammogram. This may be done every 1-2 years. Talk to your health care provider about how often you should have regular mammograms. Talk with your health care provider about your test results, treatment options, and if necessary, the need for more tests. Vaccines  Your health care provider may recommend certain vaccines, such as: Influenza vaccine. This is recommended every year. Tetanus, diphtheria, and acellular pertussis (Tdap, Td) vaccine.  You may need a Td booster every 10 years. Zoster vaccine. You may need this after age 75. Pneumococcal 13-valent conjugate (PCV13) vaccine. One dose is recommended after age 73. Pneumococcal polysaccharide (PPSV23) vaccine. One dose is recommended after age 38. Talk to your health care provider about which screenings and vaccines you need and how often you need them. This information is not intended to replace advice given to you by your health care provider. Make sure you discuss any questions you have with your health care provider. Document Released: 01/22/2015 Document Revised: 09/15/2015 Document Reviewed: 10/27/2014 Elsevier Interactive Patient Education  2017 [employer].  Fall Prevention in the Home Falls can cause injuries. They can happen to people of all ages. There are many things you can do to make your home safe and to help  prevent falls. What can I do on the outside of my home? Regularly fix the edges of walkways and driveways and fix any cracks. Remove anything that might make you trip as you walk through a door, such as a raised step or threshold. Trim any bushes or trees on the path to your home. Use bright outdoor lighting. Clear any walking paths of anything that might make someone trip, such as rocks or tools. Regularly check to see if handrails are loose or broken. Make sure that both sides of any steps have handrails. Any raised decks and porches should have guardrails on the edges. Have any leaves, snow, or ice cleared regularly. Use sand or salt on walking paths during winter. Clean up any spills in your garage right away. This includes oil or grease spills. What can I do in the bathroom? Use night lights. Install grab bars by the toilet and in the tub and shower. Do not use towel bars as grab bars. Use non-skid mats or decals in the tub or shower. If you need to sit down in the shower, use a plastic, non-slip stool. Keep the floor dry. Clean up any water that spills on the floor as soon as it happens. Remove soap buildup in the tub or shower regularly. Attach bath mats securely with double-sided non-slip rug tape. Do not have throw rugs and other things on the floor that can make you trip. What can I do in the bedroom? Use night lights. Make sure that you have a light by your bed that is easy to reach. Do not use any sheets or blankets that are too big for your bed. They should not hang down onto the floor. Have a firm chair that has side arms. You can use this for support while you get dressed. Do not have throw rugs and other things on the floor that can make you trip. What can I do in the kitchen? Clean up any spills right away. Avoid walking on wet floors. Keep items that you use a lot in easy-to-reach places. If you need to reach something above you, use a strong step stool that has a  grab bar. Keep electrical cords out of the way. Do not use floor polish or wax that makes floors slippery. If you must use wax, use non-skid floor wax. Do not have throw rugs and other things on the floor that can make you trip. What can I do with my stairs? Do not leave any items on the stairs. Make sure that there are handrails on both sides of the stairs and use them. Fix handrails that are broken or loose. Make sure that handrails are as long as  the stairways. Check any carpeting to make sure that it is firmly attached to the stairs. Fix any carpet that is loose or worn. Avoid having throw rugs at the top or bottom of the stairs. If you do have throw rugs, attach them to the floor with carpet tape. Make sure that you have a light switch at the top of the stairs and the bottom of the stairs. If you do not have them, ask someone to add them for you. What else can I do to help prevent falls? Wear shoes that: Do not have high heels. Have rubber bottoms. Are comfortable and fit you well. Are closed at the toe. Do not wear sandals. If you use a stepladder: Make sure that it is fully opened. Do not climb a closed stepladder. Make sure that both sides of the stepladder are locked into place. Ask someone to hold it for you, if possible. Clearly mark and make sure that you can see: Any grab bars or handrails. First and last steps. Where the edge of each step is. Use tools that help you move around (mobility aids) if they are needed. These include: Canes. Walkers. Scooters. Crutches. Turn on the lights when you go into a dark area. Replace any light bulbs as soon as they burn out. Set up your furniture so you have a clear path. Avoid moving your furniture around. If any of your floors are uneven, fix them. If there are any pets around you, be aware of where they are. Review your medicines with your doctor. Some medicines can make you feel dizzy. This can increase your chance of  falling. Ask your doctor what other things that you can do to help prevent falls. This information is not intended to replace advice given to you by your health care provider. Make sure you discuss any questions you have with your health care provider. Document Released: 10/22/2008 Document Revised: 06/03/2015 Document Reviewed: 01/30/2014 Elsevier Interactive Patient Education  2017 [employer].

## 2022-07-05 ENCOUNTER — Encounter: Payer: Self-pay | Admitting: Internal Medicine

## 2022-08-18 ENCOUNTER — Other Ambulatory Visit: Payer: Self-pay | Admitting: Family Medicine

## 2022-08-18 DIAGNOSIS — Z1231 Encounter for screening mammogram for malignant neoplasm of breast: Secondary | ICD-10-CM

## 2022-08-21 ENCOUNTER — Ambulatory Visit: Payer: Medicare Other

## 2022-08-21 DIAGNOSIS — Z1231 Encounter for screening mammogram for malignant neoplasm of breast: Secondary | ICD-10-CM | POA: Diagnosis not present

## 2022-09-03 ENCOUNTER — Other Ambulatory Visit: Payer: Self-pay | Admitting: Family Medicine

## 2022-11-16 ENCOUNTER — Ambulatory Visit (INDEPENDENT_AMBULATORY_CARE_PROVIDER_SITE_OTHER): Payer: Medicare Other | Admitting: Podiatry

## 2022-11-16 ENCOUNTER — Ambulatory Visit (INDEPENDENT_AMBULATORY_CARE_PROVIDER_SITE_OTHER): Payer: Medicare Other

## 2022-11-16 ENCOUNTER — Encounter: Payer: Self-pay | Admitting: Podiatry

## 2022-11-16 DIAGNOSIS — M722 Plantar fascial fibromatosis: Secondary | ICD-10-CM | POA: Diagnosis not present

## 2022-11-16 DIAGNOSIS — M778 Other enthesopathies, not elsewhere classified: Secondary | ICD-10-CM

## 2022-11-16 DIAGNOSIS — M2041 Other hammer toe(s) (acquired), right foot: Secondary | ICD-10-CM

## 2022-11-16 DIAGNOSIS — R200 Anesthesia of skin: Secondary | ICD-10-CM

## 2022-11-16 DIAGNOSIS — M2042 Other hammer toe(s) (acquired), left foot: Secondary | ICD-10-CM | POA: Diagnosis not present

## 2022-11-16 MED ORDER — CELECOXIB 100 MG PO CAPS: 100.0000 mg | ORAL_CAPSULE | Freq: Two times a day (BID) | ORAL | 0 refills | Status: DC

## 2022-11-16 NOTE — Patient Instructions (Signed)

## 2022-11-16 NOTE — Progress Notes (Signed)
Subjective: Chief Complaint  Patient presents with   Foot Pain    right foot pain/ hx of morton's neruoma surgery yrs ago and now having bil toe pain   72 year old female presents the office today with above concerns.  She said that she still getting pain to her right heel and seems to be worsening.  No recent injuries to her heel.  No swelling.  No radiating pain.  She also gets pain to her toes on both feet to a lesser digits.  She has a history of neuroma surgery but now she is having some numbness and pain to the toes which has been ongoing issue as well.  No recent injuries to the toes.  Objective: AAO x3, NAD DP/PT pulses palpable bilaterally, CRT less than 3 seconds Tenderness to palpation along the plantar medial tubercle of the calcaneus at the insertion of plantar fascia on the right foot. There is no pain along the course of the plantar fascia within the arch of the foot. Plantar fascia appears to be intact. There is no pain with lateral compression of the calcaneus or pain with vibratory sensation. There is no pain along the course or insertion of the achilles tendon. No other areas of tenderness to bilateral lower extremities. Negative tinel sign Flexible hammertoes are present.  There is some numbness in the plantar aspect of the central lesser toes.  There is no area pinpoint tenderness. No palpable neuroma noted at this time.  No pain with calf compression, swelling, warmth, erythema  Assessment: 72 year old female with a right heel pain, plantar fasciitis; toe numbness s/p neuroma excision/mild hammertoes  Plan: -All treatment options discussed with the patient including all alternatives, risks, complications.  -X-ray obtained reviewed of the right foot.  Multiple views obtained.  No subacute fracture.  Calcaneal spurring is present. -Steroid injection for the right heel.  See procedure note below. -Plantar fascia brace that she has seems to be hurting is likely too small.  New  partner plantar fascia brace to be dispensed. -Discussed stretching, icing a regular basis.  Discussed shoes, good arch support. -Toe crests to help support the toes.  Fortunately after neuroma excision some numbness can be expected. -Patient encouraged to call the office with any questions, concerns, change in symptoms.   Procedure: Injection Tendon/Ligament Discussed alternatives, risks, complications and verbal consent was obtained.  Location: Right plantar fascia at the glabrous junction; medial approach. Skin Prep: Alcohol. Injectate: 0.5cc 0.5% marcaine plain, 0.5 cc 2% lidocaine plain and, 1 cc kenalog 10. Disposition: Patient tolerated procedure well. Injection site dressed with a band-aid.  Post-injection care was discussed and return precautions discussed.    Vivi Barrack DPM

## 2022-11-20 ENCOUNTER — Ambulatory Visit: Payer: Medicare Other | Admitting: Podiatry

## 2022-12-15 NOTE — Progress Notes (Unsigned)
NEUROLOGY FOLLOW UP OFFICE NOTE  AUBREYELLA BOISEN [id number]  Assessment/Plan:   Restless leg syndrome Burning mouth syndrome Elevated blood pressure    gabapentin to 900mg  in AM, 300mg  afternoon and 900mg  at bedtime  Advised to follow up with PCP regarding blood pressure Follow up one year    Subjective:  Heavenleigh Elefante is a 72 year old woman with hypertension, osteoarthritis, depression and fibromyalgia who follows up for restless leg syndrome and burning mouth syndrome.    I  RESTLESS LEG SYNDROME: Update: Current medication:  gabapentin 900mg /300mg /900mg  Stable at this time.   History: Onset of symptoms occurred in 2013. The symptoms correlate soon after the dental procedure involving 2 canals of 2 of her right-sided upper teeth. She did see the dentist for this, who said that her symptoms are unlikely related.  She notes a "discomfort "on the left side of her face, involving the maxilla to the temple. It is not really a pain. However, she will occasionally have a brief shooting sensation, about a 5/10.  It is sensitive to the touch.  It appears to be sensitive when she is putting on her makeup. She is never in extreme pain however. She also has a burning sensation of her tongue. This also is a 5/10 in intensity. It is less painful when she is eating or drinking.  Lidocaine mouth rinse and ice provide brief relief   II BURNING MOUTH SYNDROME: Update: Current medication:  gabapentin 900mg /300mg /900mg  She is doing well.   History: She has had it for several years.  It occurs in the evening and in bed.  When she is in bed or sitting still watching TV, she would experience discomfort in the legs that is relieved by movement.  PAST MEDICAL HISTORY: Past Medical History:  Diagnosis Date   Anxiety    Benign hypertension    Burning tongue syndrome 02/06/2013   Candidiasis of mouth    Depression and Anxiety - followed by Dr. 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 07/16/2012   Fibromyalgia    GERD  (gastroesophageal reflux disease)    Hematochezia 02/13/2012   Left sided abdominal pain 02/13/2012   Osteoporosis, unspecified 07/30/2012    MEDICATIONS: Current Outpatient Medications on File Prior to Visit  Medication Sig Dispense Refill   CALCIUM PO Take 600 mg by mouth daily.     celecoxib (CELEBREX) 100 MG capsule Take 1 capsule (100 mg total) by mouth 2 (two) times daily. 30 capsule 0   cetirizine (ZYRTEC ALLERGY) 10 MG tablet Take 1 tablet (10 mg total) by mouth daily. 30 tablet 0   Cholecalciferol (VITAMIN D3) 2000 UNITS TABS Take 1 tablet by mouth daily.     gabapentin (NEURONTIN) 300 MG capsule Take 3 capsules in morning, 1 capsule in afternoon and 3 capsules at bedtime 630 capsule 3   hydrochlorothiazide (HYDRODIURIL) 12.5 MG tablet Take 1 tablet (12.5 mg total) by mouth daily. 90 tablet 3   meloxicam (MOBIC) 15 MG tablet Take 1 tablet (15 mg total) by mouth daily as needed for pain. 30 tablet 0   Multiple Vitamin (MULTIVITAMIN) tablet Take 1 tablet by mouth daily.     rosuvastatin (CRESTOR) 5 MG tablet Take 1/2 (one-half) tablet by mouth once daily 45 tablet 0   Specialty Vitamins Products (BIOTIN PLUS KERATIN) 10000-100 MCG-MG TABS Take by mouth.     zoledronic acid (RECLAST) 5 MG/100ML SOLN injection Inject 100 mLs (5 mg total) into the vein once. 100 mL 0   No current facility-administered medications on  file prior to visit.    ALLERGIES: Allergies  Allergen Reactions   Vicodin [Hydrocodone-Acetaminophen] Itching    FAMILY HISTORY: Family History  Problem Relation Age of Onset   Lung cancer Mother    Arthritis Mother    Hypertension Father    Lung cancer Father    Liver cancer Father    Epilepsy Father    Hypertension Sister    Osteoporosis Sister    Hypertension Brother    Arthritis Brother    Lung cancer Brother 75       small cell   Colon cancer Son 105   Diabetes Neg Hx       Objective:  Blood pressure (!) 163/71, pulse 75, height 5\' 2"  (1.575 m),  weight 136 lb (61.7 kg), SpO2 95%. General: No acute distress.  Patient appears well-groomed.   Head:  Normocephalic/atraumatic Eyes:  Fundi examined but not visualized Neck: supple, no paraspinal tenderness, full range of motion Heart:  Regular rate and rhythm Neurological Exam: alert and oriented.  Speech fluent and not dysarthric, language intact.  CN II-XII intact. Bulk and tone normal, muscle strength 5/5 throughout.  Sensation to light touch intact.  Deep tendon reflexes 2+ throughout.  Finger to nose testing intact.  Gait normal, Romberg negative.   Shon Millet, DO  CC: Nira Conn, MD

## 2022-12-18 ENCOUNTER — Ambulatory Visit (INDEPENDENT_AMBULATORY_CARE_PROVIDER_SITE_OTHER): Payer: Medicare Other | Admitting: Neurology

## 2022-12-18 ENCOUNTER — Encounter: Payer: Self-pay | Admitting: Neurology

## 2022-12-18 VITALS — BP 163/71 | HR 75 | Ht 62.0 in | Wt 136.0 lb

## 2022-12-18 DIAGNOSIS — G2581 Restless legs syndrome: Secondary | ICD-10-CM

## 2022-12-18 DIAGNOSIS — R030 Elevated blood-pressure reading, without diagnosis of hypertension: Secondary | ICD-10-CM

## 2022-12-18 DIAGNOSIS — K146 Glossodynia: Secondary | ICD-10-CM

## 2022-12-18 NOTE — Patient Instructions (Signed)
Continue gabapentin.  Contact me (MyChart) when you need refills

## 2022-12-28 ENCOUNTER — Ambulatory Visit (INDEPENDENT_AMBULATORY_CARE_PROVIDER_SITE_OTHER): Payer: Medicare Other | Admitting: Podiatry

## 2022-12-28 ENCOUNTER — Encounter: Payer: Self-pay | Admitting: Podiatry

## 2022-12-28 DIAGNOSIS — M722 Plantar fascial fibromatosis: Secondary | ICD-10-CM

## 2022-12-28 MED ORDER — CELECOXIB 100 MG PO CAPS: 100.0000 mg | ORAL_CAPSULE | Freq: Two times a day (BID) | ORAL | 0 refills | Status: DC

## 2022-12-28 MED ORDER — TRIAMCINOLONE ACETONIDE 10 MG/ML IJ SUSP
5.0000 mg | Freq: Once | INTRAMUSCULAR | Status: AC
  Administered 2022-12-28: 5 mg via INTRAMUSCULAR

## 2022-12-28 NOTE — Patient Instructions (Signed)

## 2022-12-28 NOTE — Progress Notes (Signed)
Subjective: Chief Complaint  Patient presents with   Plantar Fasciitis    RM#14 Right foot follow up on plantar fasciitis patient states is not completely better but is getting there.    72 year old female presents the office for above concerns.  She did well after the injection last appointment.  She is no longer taking anti-inflammatories.  She gets pain about the heel but somewhat better.  No new injuries or concerns otherwise.  Objective: AAO x3, NAD DP/PT pulses palpable bilaterally, CRT less than 3 seconds There is still tenderness palpation on the plantar medial tubercle of the calcaneus at the insertion of the plantar fascia on the right foot.  There is no pain with lateral compression of the calcaneus.  No pain along Achilles tendon.  Clinically the Achilles tendon, plantar fascia are intact.  There is no sign.  MMT 5/5. No pain with calf compression, swelling, warmth, erythema  Assessment: Plantar fasciitis  Plan: -All treatment options discussed with the patient including all alternatives, risks, complications.  -Steroid injection performed.  See procedure note below.  Continue stretching, ice centimeter basis as well as shoes, therefore.  Anti-inflammatories as needed.  I dispensed a new plantar fascia brace as hers that was dispensed originally was too small causing discomfort. -Patient encouraged to call the office with any questions, concerns, change in symptoms.    Procedure: Injection Tendon/Ligament Discussed alternatives, risks, complications and verbal consent was obtained.  Location: Right plantar fascia at the glabrous junction; medial approach. Skin Prep: Alcohol. Injectate: 0.5cc 0.5% marcaine plain, 0.5 cc 2% lidocaine plain and, 1 cc kenalog 10. Disposition: Patient tolerated procedure well. Injection site dressed with a band-aid.  Post-injection care was discussed and return precautions discussed.   Return in about 4 weeks (around 01/25/2023).  Vivi Barrack DPM

## 2023-01-25 ENCOUNTER — Telehealth: Payer: Self-pay | Admitting: *Deleted

## 2023-01-25 MED ORDER — HYDROCHLOROTHIAZIDE 12.5 MG PO TABS: 12.5000 mg | ORAL_TABLET | Freq: Every day | ORAL | 0 refills | Status: DC

## 2023-01-25 MED ORDER — ROSUVASTATIN CALCIUM 5 MG PO TABS: 5.0000 mg | ORAL_TABLET | Freq: Every day | ORAL | 0 refills | Status: DC

## 2023-01-25 NOTE — Telephone Encounter (Signed)
Rx done. 

## 2023-01-26 ENCOUNTER — Other Ambulatory Visit: Payer: Self-pay

## 2023-01-26 MED ORDER — GABAPENTIN 300 MG PO CAPS: ORAL_CAPSULE | ORAL | 0 refills | Status: DC

## 2023-02-15 ENCOUNTER — Ambulatory Visit (INDEPENDENT_AMBULATORY_CARE_PROVIDER_SITE_OTHER): Payer: Medicare Other | Admitting: Family Medicine

## 2023-02-15 VITALS — BP 138/82 | HR 72 | Temp 98.1°F | Ht 62.0 in | Wt 140.7 lb

## 2023-02-15 DIAGNOSIS — Z131 Encounter for screening for diabetes mellitus: Secondary | ICD-10-CM

## 2023-02-15 DIAGNOSIS — E785 Hyperlipidemia, unspecified: Secondary | ICD-10-CM

## 2023-02-15 DIAGNOSIS — I10 Essential (primary) hypertension: Secondary | ICD-10-CM | POA: Diagnosis not present

## 2023-02-15 DIAGNOSIS — R81 Glycosuria: Secondary | ICD-10-CM

## 2023-02-15 DIAGNOSIS — M545 Low back pain, unspecified: Secondary | ICD-10-CM

## 2023-02-15 DIAGNOSIS — M170 Bilateral primary osteoarthritis of knee: Secondary | ICD-10-CM

## 2023-02-15 LAB — POCT URINALYSIS DIPSTICK
Bilirubin, UA: NEGATIVE
Glucose, UA: POSITIVE — AB
Ketones, UA: NEGATIVE
Leukocytes, UA: NEGATIVE
Nitrite, UA: NEGATIVE
Protein, UA: NEGATIVE
Spec Grav, UA: 1.015 (ref 1.010–1.025)
Urobilinogen, UA: 0.2 U/dL
pH, UA: 6 (ref 5.0–8.0)

## 2023-02-15 MED ORDER — ROSUVASTATIN CALCIUM 5 MG PO TABS: 5.0000 mg | ORAL_TABLET | Freq: Every day | ORAL | 1 refills | Status: DC

## 2023-02-15 MED ORDER — CELECOXIB 100 MG PO CAPS: 100.0000 mg | ORAL_CAPSULE | Freq: Two times a day (BID) | ORAL | 2 refills | Status: DC

## 2023-02-15 MED ORDER — TIZANIDINE HCL 2 MG PO TABS: 2.0000 mg | ORAL_TABLET | Freq: Three times a day (TID) | ORAL | 1 refills | Status: DC | PRN

## 2023-02-15 MED ORDER — HYDROCHLOROTHIAZIDE 12.5 MG PO TABS: 12.5000 mg | ORAL_TABLET | Freq: Every day | ORAL | 1 refills | Status: DC

## 2023-02-15 NOTE — Progress Notes (Signed)
 Acute Office Visit  Subjective:     Patient ID: Dawn Reyes, female    DOB: 1950-04-27, 73 y.o.   MRN: [id number]  Chief Complaint  Patient presents with   Back Pain    Achy lower back/side pain 1-2 months    Knee Pain    Sharp when walking and achy while in bed  3 weeks    Current Outpatient Medications  Medication Instructions   CALCIUM  PO 600 mg, Daily   celecoxib  (CELEBREX ) 100 mg, Oral, 2 times daily   cetirizine  (ZYRTEC  ALLERGY) 10 mg, Oral, Daily   Cholecalciferol (VITAMIN D3) 2000 UNITS TABS 1 tablet, Daily   gabapentin  (NEURONTIN ) 300 MG capsule Take 3 capsules in morning, 1 capsule in afternoon and 3 capsules at bedtime   hydrochlorothiazide  (HYDRODIURIL ) 12.5 mg, Oral, Daily   Multiple Vitamin (MULTIVITAMIN) tablet 1 tablet, Daily   rosuvastatin  (CRESTOR ) 5 mg, Oral, Daily   Specialty Vitamins Products (BIOTIN PLUS KERATIN) 10000-100 MCG-MG TABS Take by mouth.   tiZANidine  (ZANAFLEX ) 2-4 mg, Oral, Every 8 hours PRN   zoledronic  acid (RECLAST ) 5 mg, Intravenous,  Once    Pt also here for follow up today in addition to her new symptoms.   HTN -- BP in office performed and is well controlled. She  reports no side effects to the medications, no chest pain, SOB, dizziness or headaches. She has a BP cuff at home and is checking BP regularly, reports they are in the normal range.   HLD-- on rosuvastatin  5 mg daily, reports she is tolerating this medication well, denies any muscle cramps, leg weakness. Needs refills and new lipid panel today.    Patient is reporting acute lower back pain going on for 1-2 months, states that this isn't like the left hip pain she was having previously, states that it is on the left side just above the iliac crest. States that her leg is going numb, happens when she is sitting down mostly. The pain goes down the leg to her ankle. Pt is seeing Dr. Skeet for restless leg and has been on gabapentin  for years. Pt reports she has tried ibuprofen,  tylenol  and naproxen  without improvement. Has tried pilates without improvement.   Pt did see [facility] last year, had MRI lumbar spine which showed lumbar disc disease multlilevel, worse at L5 -S1.   Review of Systems  All other systems reviewed and are negative.       Objective:    BP 138/82 (BP Location: Left Arm, Patient Position: Sitting, Cuff Size: Normal)   Pulse 72   Temp 98.1 F (36.7 C)   Ht 5' 2 (1.575 m)   Wt 140 lb 11.2 oz (63.8 kg)   SpO2 97%   BMI 25.73 kg/m    Physical Exam Constitutional:      Appearance: Normal appearance. She is normal weight.  Cardiovascular:     Pulses: Normal pulses.  Pulmonary:     Effort: Pulmonary effort is normal.  Musculoskeletal:        General: Tenderness (midline lower back) present.     Right knee: No effusion or erythema. Normal range of motion.     Left knee: No effusion or erythema. Normal range of motion.  Neurological:     Mental Status: She is alert and oriented to person, place, and time. Mental status is at baseline.     Results for orders placed or performed in visit on 02/15/23  POCT urinalysis dipstick  Result Value Ref Range  Color, UA Yellow    Clarity, UA Clear    Glucose, UA Positive (A) Negative   Bilirubin, UA Negative    Ketones, UA Negatinve    Spec Grav, UA 1.015 1.010 - 1.025   Blood, UA +-    pH, UA 6.0 5.0 - 8.0   Protein, UA Negative Negative   Urobilinogen, UA 0.2 0.2 or 1.0 E.U./dL   Nitrite, UA Negative    Leukocytes, UA Negative Negative   Appearance     Odor None         Assessment & Plan:   Problem List Items Addressed This Visit       Unprioritized   Essential hypertension, benign   Current hypertension medications:       Sig   hydrochlorothiazide  (HYDRODIURIL ) 12.5 MG tablet Take 1 tablet (12.5 mg total) by mouth daily.      BP is well controlled on the current medication listed above, will continue this as prescribed.      Relevant Medications    rosuvastatin  (CRESTOR ) 5 MG tablet   hydrochlorothiazide  (HYDRODIURIL ) 12.5 MG tablet   Dyslipidemia   Refills her crestor  5 mg daily, she is doing well on this medication, no side effects, continue this as prescribed and check new lipid panel today      Relevant Medications   rosuvastatin  (CRESTOR ) 5 MG tablet   Other Relevant Orders   Lipid Panel (Completed)   Low back pain - Primary   No radiculopathy today in the history, most likely MSK in origin, I reviewed her MRI with her which was mild disc degeneration, this was afrom 2008 and could have progressed since then, she might need to see her orthopedist again and have new imaging done, for now will treat with celebrex  and tizandine , recommended stretching and strengthening exericse/ PT also,.       Relevant Medications   tiZANidine  (ZANAFLEX ) 2 MG tablet   celecoxib  (CELEBREX ) 100 MG capsule   Other Relevant Orders   POCT urinalysis dipstick (Completed)   Other Visit Diagnoses       Glucosuria         Primary osteoarthritis of both knees       Relevant Medications   tiZANidine  (ZANAFLEX ) 2 MG tablet   celecoxib  (CELEBREX ) 100 MG capsule   Other Relevant Orders   DG Knee 1-2 Views Left   DG Knee 1-2 Views Right     Screening for diabetes mellitus       Relevant Orders   Hemoglobin A1c (Completed)       Meds ordered this encounter  Medications   rosuvastatin  (CRESTOR ) 5 MG tablet    Sig: Take 1 tablet (5 mg total) by mouth daily.    Dispense:  90 tablet    Refill:  1    Last refill-patient needs an appt-last visit 2023   hydrochlorothiazide  (HYDRODIURIL ) 12.5 MG tablet    Sig: Take 1 tablet (12.5 mg total) by mouth daily.    Dispense:  90 tablet    Refill:  1    Last refill-patient needs an appt-last visit 2023   tiZANidine  (ZANAFLEX ) 2 MG tablet    Sig: Take 1-2 tablets (2-4 mg total) by mouth every 8 (eight) hours as needed for muscle spasms.    Dispense:  90 tablet    Refill:  1   celecoxib  (CELEBREX ) 100 MG  capsule    Sig: Take 1 capsule (100 mg total) by mouth 2 (two) times daily.    Dispense:  60 capsule    Refill:  2    No follow-ups on file.  Heron CHRISTELLA Sharper, MD

## 2023-02-16 ENCOUNTER — Other Ambulatory Visit (INDEPENDENT_AMBULATORY_CARE_PROVIDER_SITE_OTHER): Payer: Medicare Other

## 2023-02-16 ENCOUNTER — Other Ambulatory Visit: Payer: Medicare Other

## 2023-02-16 ENCOUNTER — Ambulatory Visit: Payer: Medicare Other

## 2023-02-16 DIAGNOSIS — E785 Hyperlipidemia, unspecified: Secondary | ICD-10-CM

## 2023-02-16 DIAGNOSIS — Z131 Encounter for screening for diabetes mellitus: Secondary | ICD-10-CM | POA: Diagnosis not present

## 2023-02-16 DIAGNOSIS — M170 Bilateral primary osteoarthritis of knee: Secondary | ICD-10-CM

## 2023-02-16 DIAGNOSIS — M810 Age-related osteoporosis without current pathological fracture: Secondary | ICD-10-CM | POA: Diagnosis not present

## 2023-02-16 LAB — HEMOGLOBIN A1C: Hgb A1c MFr Bld: 5.7 % (ref 4.6–6.5)

## 2023-02-16 LAB — BASIC METABOLIC PANEL
BUN: 14 mg/dL (ref 6–23)
CO2: 30 meq/L (ref 19–32)
Calcium: 8.9 mg/dL (ref 8.4–10.5)
Chloride: 101 meq/L (ref 96–112)
Creatinine, Ser: 0.71 mg/dL (ref 0.40–1.20)
GFR: 84.38 mL/min (ref >60.00)
Glucose, Bld: 84 mg/dL (ref 70–99)
Potassium: 3.9 meq/L (ref 3.5–5.1)
Sodium: 142 meq/L (ref 135–145)

## 2023-02-16 LAB — LIPID PANEL
Cholesterol: 154 mg/dL (ref 0–200)
HDL: 78.3 mg/dL (ref >39.00)
LDL Cholesterol: 65 mg/dL (ref 0–99)
NonHDL: 75.39
Total CHOL/HDL Ratio: 2
Triglycerides: 54 mg/dL (ref 0.0–149.0)
VLDL: 10.8 mg/dL (ref 0.0–40.0)

## 2023-02-16 LAB — VITAMIN D 25 HYDROXY (VIT D DEFICIENCY, FRACTURES): VITD: 50.66 ng/mL (ref 30.00–100.00)

## 2023-02-17 ENCOUNTER — Other Ambulatory Visit: Payer: Self-pay | Admitting: Family Medicine

## 2023-02-17 DIAGNOSIS — I10 Essential (primary) hypertension: Secondary | ICD-10-CM

## 2023-02-19 ENCOUNTER — Encounter: Payer: Self-pay | Admitting: Family Medicine

## 2023-02-20 ENCOUNTER — Ambulatory Visit: Payer: Medicare Other | Admitting: Family Medicine

## 2023-02-21 ENCOUNTER — Encounter: Payer: Self-pay | Admitting: Family Medicine

## 2023-02-21 DIAGNOSIS — E785 Hyperlipidemia, unspecified: Secondary | ICD-10-CM | POA: Insufficient documentation

## 2023-02-21 DIAGNOSIS — M545 Low back pain, unspecified: Secondary | ICD-10-CM | POA: Insufficient documentation

## 2023-02-21 NOTE — Assessment & Plan Note (Signed)
No radiculopathy today in the history, most likely MSK in origin, I reviewed her MRI with her which was mild disc degeneration, this was afrom 2008 and could have progressed since then, she might need to see her orthopedist again and have new imaging done, for now will treat with celebrex and tizandine, recommended stretching and strengthening exericse/ PT also,.

## 2023-02-21 NOTE — Assessment & Plan Note (Signed)
Refills her crestor 5 mg daily, she is doing well on this medication, no side effects, continue this as prescribed and check new lipid panel today

## 2023-02-21 NOTE — Assessment & Plan Note (Signed)
Current hypertension medications:       Sig   hydrochlorothiazide (HYDRODIURIL) 12.5 MG tablet Take 1 tablet (12.5 mg total) by mouth daily.      BP is well controlled on the current medication listed above, will continue this as prescribed.

## 2023-02-27 ENCOUNTER — Encounter: Payer: Self-pay | Admitting: Family Medicine

## 2023-03-05 ENCOUNTER — Encounter: Payer: Self-pay | Admitting: Family Medicine

## 2023-03-05 DIAGNOSIS — M545 Other chronic pain: Secondary | ICD-10-CM

## 2023-03-06 MED ORDER — METHYLPREDNISOLONE 4 MG PO TBPK: ORAL_TABLET | ORAL | 0 refills | Status: DC

## 2023-03-08 ENCOUNTER — Other Ambulatory Visit: Payer: Self-pay | Admitting: Family Medicine

## 2023-03-08 DIAGNOSIS — E785 Hyperlipidemia, unspecified: Secondary | ICD-10-CM

## 2023-03-08 DIAGNOSIS — I10 Essential (primary) hypertension: Secondary | ICD-10-CM

## 2023-04-03 DIAGNOSIS — D225 Melanocytic nevi of trunk: Secondary | ICD-10-CM | POA: Diagnosis not present

## 2023-04-03 DIAGNOSIS — L814 Other melanin hyperpigmentation: Secondary | ICD-10-CM | POA: Diagnosis not present

## 2023-04-03 DIAGNOSIS — L578 Other skin changes due to chronic exposure to nonionizing radiation: Secondary | ICD-10-CM | POA: Diagnosis not present

## 2023-04-03 DIAGNOSIS — L821 Other seborrheic keratosis: Secondary | ICD-10-CM | POA: Diagnosis not present

## 2023-04-03 DIAGNOSIS — Z86018 Personal history of other benign neoplasm: Secondary | ICD-10-CM | POA: Diagnosis not present

## 2023-04-16 DIAGNOSIS — M5416 Radiculopathy, lumbar region: Secondary | ICD-10-CM | POA: Diagnosis not present

## 2023-04-24 DIAGNOSIS — M5416 Radiculopathy, lumbar region: Secondary | ICD-10-CM | POA: Diagnosis not present

## 2023-05-01 ENCOUNTER — Encounter: Payer: Self-pay | Admitting: Internal Medicine

## 2023-05-08 ENCOUNTER — Ambulatory Visit (INDEPENDENT_AMBULATORY_CARE_PROVIDER_SITE_OTHER): Payer: Medicare Other | Admitting: Internal Medicine

## 2023-05-08 ENCOUNTER — Encounter: Payer: Self-pay | Admitting: Internal Medicine

## 2023-05-08 VITALS — BP 104/66 | HR 72 | Ht 62.0 in | Wt 141.0 lb

## 2023-05-08 DIAGNOSIS — M810 Age-related osteoporosis without current pathological fracture: Secondary | ICD-10-CM | POA: Diagnosis not present

## 2023-05-08 NOTE — Progress Notes (Signed)
 Name: Dawn Reyes  MRN/ DOB: [id number], [birth year]    Age/ Sex: 73 y.o., female     PCP: Aida House, MD   Reason for Endocrinology Evaluation: Osteoporosis     Initial Endocrinology Clinic Visit: 07/30/2012    PATIENT IDENTIFIER: Dawn Reyes is a 73 y.o., female with a past medical history of osteoporosis, anxiety disorders, and dyslipidemia. She has followed with [facility] since 07/30/2012 for consultative assistance with management of her osteoporosis.   HISTORICAL SUMMARY:   Pt was diagnosed initially diagnosed with osteopenia in 2006 through her gynecologist.  She had a T-score of -2.0 at the right femoral neck with normal spine T-score.  She was subsequently diagnosed with osteoporosis in 2011 with a T-score of -2.5 at the hip  Menopausal at age : ~ 84  Fracture Hx: right Fifth metatarsal and displaced fracture in 09/2020 Hx of HRT: yes FH of osteoporosis or hip fracture: no Prior Hx of anti-resorptive therapy : Fosamax for approximately 2 years but discontinued due to lower chest and upper abdominal discomfort.  She tried again in 2014 but could not tolerate it for longer than 6 months  She received zoledronic  acid 05/19/2015, 05/09/2016, 05/15/2017, 06/2018, 06/2019, and 05/25/2021    Patient was following up with Dr. Hubert Madden from 2014 until 05/2022   SUBJECTIVE:    Today (05/08/2023):  Dawn Reyes is here for follow-up on osteoporosis.  Denies heartburn  No recent falls  No fractures in the past year  Denies constipation or diarrhea  No rash or flu like symptoms with reclast   No exercise   Vitamin D3 2000 units daily Calcium  1 tablet daily  MVI daily  HISTORY:  Past Medical History:  Past Medical History:  Diagnosis Date   Anxiety    Benign hypertension    Burning tongue syndrome 02/06/2013   Candidiasis of mouth    Depression and Anxiety - followed by Dr. Deborra Falter in psychiatry 07/16/2012   Fibromyalgia    GERD (gastroesophageal reflux  disease)    Hematochezia 02/13/2012   Left sided abdominal pain 02/13/2012   Osteoporosis, unspecified 07/30/2012   Past Surgical History:  Past Surgical History:  Procedure Laterality Date   ABDOMINAL HYSTERECTOMY     no concern for cancer; menorrhagia   BREAST EXCISIONAL BIOPSY Right 11/14/2002   Radial Scar   FOOT SURGERY Bilateral    mortons neuroma removal   TONSILLECTOMY     Social History:  reports that she has never smoked. She has never used smokeless tobacco. She reports that she does not drink alcohol  and does not use drugs. Family History:  Family History  Problem Relation Age of Onset   Lung cancer Mother    Arthritis Mother    Hypertension Father    Lung cancer Father    Liver cancer Father    Epilepsy Father    Hypertension Sister    Osteoporosis Sister    Hypertension Brother    Arthritis Brother    Lung cancer Brother 75       small cell   Colon cancer Son 64   Diabetes Neg Hx      HOME MEDICATIONS: Allergies as of 05/08/2023       Reactions   Vicodin [hydrocodone-acetaminophen ] Itching        Medication List        Accurate as of May 08, 2023  8:02 AM. If you have any questions, ask your nurse or doctor.  STOP taking these medications    methylPREDNISolone  4 MG Tbpk tablet Commonly known as: MEDROL  DOSEPAK Stopped by: Dawn Reyes   tiZANidine  2 MG tablet Commonly known as: ZANAFLEX  Stopped by: Dawn Reyes       TAKE these medications    Biotin Plus Keratin 10000-100 MCG-MG Tabs Take by mouth.   CALCIUM  PO Take 600 mg by mouth daily.   celecoxib  100 MG capsule Commonly known as: CeleBREX  Take 1 capsule (100 mg total) by mouth 2 (two) times daily.   cetirizine  10 MG tablet Commonly known as: ZyrTEC  Allergy Take 1 tablet (10 mg total) by mouth daily.   gabapentin  300 MG capsule Commonly known as: NEURONTIN  Take 3 capsules in morning, 1 capsule in afternoon and 3 capsules at bedtime    hydrochlorothiazide  12.5 MG tablet Commonly known as: HYDRODIURIL  Take 1 tablet (12.5 mg total) by mouth daily.   multivitamin tablet Take 1 tablet by mouth daily.   rosuvastatin  5 MG tablet Commonly known as: CRESTOR  TAKE 1 TABLET EVERY DAY   Vitamin D3 50 MCG (2000 UT) Tabs Take 1 tablet by mouth daily.   zoledronic  acid 5 MG/100ML Soln injection Commonly known as: Reclast  Inject 100 mLs (5 mg total) into the vein once.          OBJECTIVE:   PHYSICAL EXAM: VS: BP 104/66 (BP Location: Left Arm, Patient Position: Sitting, Cuff Size: Small)   Pulse 72   Ht 5\' 2"  (1.575 m)   Wt 141 lb (64 kg)   SpO2 97%   BMI 25.79 kg/m    EXAM: General: Pt appears well and is in NAD  Eyes: External eye exam normal without stare, lid lag or exophthalmos.  EOM intact.    Neck: General: Supple without adenopathy. Thyroid : Thyroid  size normal.  No goiter or nodules appreciated. No thyroid  bruit.  Lungs: Clear with good BS bilat   Heart: Auscultation: RRR.  Abdomen: soft, nontender  Extremities:  BL LE: No pretibial edema  Mental Status: Judgment, insight: Intact Orientation: Oriented to time, place, and person Mood and affect: No depression, anxiety, or agitation     DATA REVIEWED:  Latest Reference Range & Units 02/16/23 10:07  Sodium 135 - 145 mEq/L 142  Potassium 3.5 - 5.1 mEq/L 3.9  Chloride 96 - 112 mEq/L 101  CO2 19 - 32 mEq/L 30  Glucose 70 - 99 mg/dL 84  BUN 6 - 23 mg/dL 14  Creatinine 0.96 - 0.45 mg/dL 4.09  Calcium  8.4 - 10.5 mg/dL 8.9  GFR >81.19 mL/min 84.38  Total CHOL/HDL Ratio  2  Cholesterol 0 - 200 mg/dL 147  HDL Cholesterol >82.95 mg/dL 62.13  LDL (calc) 0 - 99 mg/dL 65  NonHDL  08.65  Triglycerides 0.0 - 149.0 mg/dL 78.4  VLDL 0.0 - 69.6 mg/dL 29.5  VITD 28.41 - 324.40 ng/mL 50.66       DXA 05/17/2022     Results:   Lumbar spine L1-L4(L3) Femoral neck (FN)  T-score -0.5 RFN: 2.0 LFN: -2.0  Change in BMD from previous DXA test (%) Down  0.7% Up 2.7%   Old records , labs and images have been reviewed.    ASSESSMENT / PLAN / RECOMMENDATIONS:   Osteoporosis  -She has intolerance to alendronate - She did receive annual zoledronic  injections through her previous endocrinologist from 2017 until 2023 (skipping 2022) - DXA scan in 2024 showed osteopenia with a T-score of -2.0 at the right and left femoral neck - I have recommended  continuing with drug holiday , until repeating DXA scan next year -We discussed rare side effect of atypical fractures and osteonecrosis.  - BMP and vitamin D  normal  Medications  Calcium  1000-1200 mg daily Vitamin D3 2000 units daily   Follow-up in 1 year  I spent 25 minutes preparing to see the patient by review of recent labs, imaging and procedures, obtaining and reviewing separately obtained history, communicating with the patient, ordering medications, tests or procedures, and documenting clinical information in the EHR including the differential Dx, treatment, and any further evaluation and other management    Signed electronically by: Natale Bail, MD  [facility] End[facility]  [facility] [address] [place] [place], [place] [address] Phone: [phone] FAX: [phone]      CC: Aida House, MD [address] [place] [address] Phone: [phone]  Fax: [phone]   Return to Endocrinology clinic as below: Future Appointments  Date Time Provider Department Center  05/08/2023  8:10 AM Malikye Reppond, Julian Obey, MD [facility] None  07/06/2023  9:20 AM [facility]NUAL WELLNESS VISIT [facility]  12/18/2023  8:50 AM Merriam Abbey, DO [facility] None

## 2023-05-08 NOTE — Patient Instructions (Signed)
 Vitamin D3 2000 units daily Calcium  between 1000-1200 mg daily Weightbearing exercises 2-3 times a week

## 2023-05-17 ENCOUNTER — Encounter: Payer: Self-pay | Admitting: Internal Medicine

## 2023-07-06 ENCOUNTER — Ambulatory Visit (INDEPENDENT_AMBULATORY_CARE_PROVIDER_SITE_OTHER): Payer: Medicare Other

## 2023-07-06 VITALS — Ht 62.0 in | Wt 141.0 lb

## 2023-07-06 DIAGNOSIS — Z0000 Encounter for general adult medical examination without abnormal findings: Secondary | ICD-10-CM

## 2023-07-06 NOTE — Progress Notes (Signed)
 Subjective:   Dawn Reyes is a 73 y.o. who presents for a Medicare Wellness preventive visit.  As a reminder, Annual Wellness Visits don't include a physical exam, and some assessments may be limited, especially if this visit is performed virtually. We may recommend an in-person follow-up visit with your provider if needed.  Visit Complete: Virtual I connected with  Dawn Reyes on 07/06/23 by a audio enabled telemedicine application and verified that I am speaking with the correct person using two identifiers.  Patient Location: Home  Provider Location: Home Office  I discussed the limitations of evaluation and management by telemedicine. The patient expressed understanding and agreed to proceed.  Vital Signs: Because this visit was a virtual/telehealth visit, some criteria may be missing or patient reported. Any vitals not documented were not able to be obtained and vitals that have been documented are patient reported.    Persons Participating in Visit: Patient.  AWV Questionnaire: No: Patient Medicare AWV questionnaire was not completed prior to this visit.  Cardiac Risk Factors include: advanced age (>31men, >31 women);dyslipidemia;hypertension     Objective:    Today's Vitals   07/06/23 0925  Weight: 141 lb (64 kg)  Height: 5' 2 (1.575 m)   Body mass index is 25.79 kg/m.     07/06/2023    9:31 AM 12/18/2022    9:01 AM 07/03/2022    9:30 AM 12/16/2021    8:45 AM 06/24/2021    8:26 AM 05/11/2020    8:24 AM 03/09/2020    8:40 AM  Advanced Directives  Does Patient Have a Medical Advance Directive? Yes Yes Yes Yes Yes No No  Type of Estate agent of [place];Living will Healthcare Power of [place];Living will Healthcare Power of [place];Living will Healthcare Power of [place];Living will Healthcare Power of [place];Living will    Does patient want to make changes to medical advance directive?     No - Patient declined    Copy of Healthcare Power  of Attorney in Chart? No - copy requested  No - copy requested  No - copy requested    Would patient like information on creating a medical advance directive?      No - Patient declined No - Patient declined    Current Medications (verified) Outpatient Encounter Medications as of 07/06/2023  Medication Sig   CALCIUM  PO Take 600 mg by mouth daily.   celecoxib  (CELEBREX ) 100 MG capsule Take 1 capsule (100 mg total) by mouth 2 (two) times daily.   cetirizine  (ZYRTEC  ALLERGY) 10 MG tablet Take 1 tablet (10 mg total) by mouth daily.   Cholecalciferol (VITAMIN D3) 2000 UNITS TABS Take 1 tablet by mouth daily.   gabapentin  (NEURONTIN ) 300 MG capsule Take 3 capsules in morning, 1 capsule in afternoon and 3 capsules at bedtime   hydrochlorothiazide  (HYDRODIURIL ) 12.5 MG tablet Take 1 tablet (12.5 mg total) by mouth daily.   Multiple Vitamin (MULTIVITAMIN) tablet Take 1 tablet by mouth daily.   rosuvastatin  (CRESTOR ) 5 MG tablet TAKE 1 TABLET EVERY DAY   Specialty Vitamins Products (BIOTIN PLUS KERATIN) 10000-100 MCG-MG TABS Take by mouth.   zoledronic  acid (RECLAST ) 5 MG/100ML SOLN injection Inject 100 mLs (5 mg total) into the vein once.   No facility-administered encounter medications on file as of 07/06/2023.    Allergies (verified) Vicodin [hydrocodone-acetaminophen ]   History: Past Medical History:  Diagnosis Date   Anxiety    Benign hypertension    Burning tongue syndrome 02/06/2013  Candidiasis of mouth    Depression and Anxiety - followed by Dr. 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 07/16/2012   Fibromyalgia    GERD (gastroesophageal reflux disease)    Hematochezia 02/13/2012   Left sided abdominal pain 02/13/2012   Osteoporosis, unspecified 07/30/2012   Past Surgical History:  Procedure Laterality Date   ABDOMINAL HYSTERECTOMY     no concern for cancer; menorrhagia   BREAST EXCISIONAL BIOPSY Right 11/14/2002   Radial Scar   FOOT SURGERY Bilateral    mortons neuroma removal   TONSILLECTOMY      Family History  Problem Relation Age of Onset   Lung cancer Mother    Arthritis Mother    Hypertension Father    Lung cancer Father    Liver cancer Father    Epilepsy Father    Hypertension Sister    Osteoporosis Sister    Hypertension Brother    Arthritis Brother    Lung cancer Brother 75       small cell   Colon cancer Son 50   Diabetes Neg Hx    Social History   Socioeconomic History   Marital status: Widowed    Spouse name: Not on file   Number of children: 2   Years of education: Not on file   Highest education level: 12th grade  Occupational History   Occupation: CSR    Employer: [employer]  Tobacco Use   Smoking status: Never   Smokeless tobacco: Never  Vaping Use   Vaping status: Never Used  Substance and Sexual Activity   Alcohol  use: No    Alcohol /week: 0.0 standard drinks of alcohol    Drug use: No   Sexual activity: Never  Other Topics Concern   Not on file  Social History Narrative   Work or School: industry for the [employer] Situation: lives with husbandSpiritual Beliefs: BaptistLifestyle: no regular exercise; diet is ok right handed    Social Drivers of Corporate investment banker Strain: Low Risk  (07/06/2023)   Overall Financial Resource Strain (CARDIA)    Difficulty of Paying Living Expenses: Not hard at all  Food Insecurity: No Food Insecurity (07/06/2023)   Hunger Vital Sign    Worried About Running Out of Food in the Last Year: Never true    Ran Out of Food in the Last Year: Never true  Transportation Needs: No Transportation Needs (07/06/2023)   PRAPARE - Administrator, Civil Service (Medical): No    Lack of Transportation (Non-Medical): No  Physical Activity: Inactive (07/06/2023)   Exercise Vital Sign    Days of Exercise per Week: 0 days    Minutes of Exercise per Session: 0 min  Stress: No Stress Concern Present (07/06/2023)   [employer] of Occupational Health - Occupational Stress Questionnaire     Feeling of Stress: Not at all  Social Connections: Socially Isolated (07/06/2023)   Social Connection and Isolation Panel    Frequency of Communication with Friends and Family: More than three times a week    Frequency of Social Gatherings with Friends and Family: More than three times a week    Attends Religious Services: Never    Database administrator or Organizations: No    Attends Banker Meetings: Never    Marital Status: Widowed    Tobacco Counseling Counseling given: Not Answered    Clinical Intake:  Pre-visit preparation completed: Yes  Pain : No/denies pain     BMI - recorded: 25.79 Nutritional Status:  BMI 25 -29 Overweight Nutritional Risks: None Diabetes: No  Lab Results  Component Value Date   HGBA1C 5.7 02/16/2023   HGBA1C 5.6 01/05/2020   HGBA1C 5.5 12/20/2018     How often do you need to have someone help you when you read instructions, pamphlets, or other written materials from your doctor or pharmacy?: 1 - Never  Interpreter Needed?: No  Information entered by :: Rojelio Blush LPN   Activities of Daily Living     07/06/2023    9:30 AM  In your present state of health, do you have any difficulty performing the following activities:  Hearing? 0  Vision? 0  Difficulty concentrating or making decisions? 0  Walking or climbing stairs? 0  Dressing or bathing? 0  Doing errands, shopping? 0  Preparing Food and eating ? N  Using the Toilet? N  In the past six months, have you accidently leaked urine? N  Do you have problems with loss of bowel control? N  Managing your Medications? N  Managing your Finances? N  Housekeeping or managing your Housekeeping? N    Patient Care Team: Ozell Heron HERO, MD as PCP - General (Family Medicine) Estelle Service, MD as Attending Physician (Obstetrics and Gynecology) Abran Norleen SAILOR, MD as Attending Physician (Gastroenterology) [place], Tawni CROME, MD as Referring Physician  (Dermatology) Liane Sharyne MATSU, [facility] (Inactive) as Pharmacist (Pharmacist) Skeet Juliene SAUNDERS, DO as Consulting Physician (Neurology)  I have updated your Care Teams any recent Medical Services you may have received from other providers in the past year.     Assessment:   This is a routine wellness examination for Trany.  Hearing/Vision screen Hearing Screening - Comments:: Denies hearing difficulties   Vision Screening - Comments:: Wears rx glasses - up to date with routine eye exams with  [facility]   Goals Addressed               This Visit's Progress     Increase physical activity (pt-stated)        Get more active.       Depression Screen     07/06/2023    9:30 AM 02/15/2023    3:46 PM 07/03/2022    9:28 AM 12/29/2021   10:54 AM 06/24/2021    8:21 AM 04/25/2021   10:00 AM 01/17/2021    9:55 AM  PHQ 2/9 Scores  PHQ - 2 Score 0 0 0 0 0 0 1  PHQ- 9 Score  0  0 0 4 8    Fall Risk     07/06/2023    9:30 AM 02/15/2023    3:46 PM 12/18/2022    9:01 AM 07/03/2022    9:29 AM 07/03/2022    8:10 AM  Fall Risk   Falls in the past year? 0 0 0 0 0  Number falls in past yr: 0 0 0 0 0  Injury with Fall? 0 0 0 0 0  Risk for fall due to : No Fall Risks No Fall Risks  No Fall Risks   Follow up Falls evaluation completed Falls prevention discussed;Falls evaluation completed Falls evaluation completed Falls prevention discussed     MEDICARE RISK AT HOME:  Medicare Risk at Home Any stairs in or around the home?: No If so, are there any without handrails?: No Home free of loose throw rugs in walkways, pet beds, electrical cords, etc?: Yes Adequate lighting in your home to reduce risk of falls?: Yes Life alert?: No Use of  a cane, walker or w/c?: No Grab bars in the bathroom?: Yes Shower chair or bench in shower?: No Elevated toilet seat or a handicapped toilet?: No  TIMED UP AND GO:  Was the test performed?  No  Cognitive Function: 6CIT completed        07/06/2023    9:31 AM  07/03/2022    9:30 AM 06/24/2021    8:27 AM  6CIT Screen  What Year? 0 points 0 points 0 points  What month? 0 points 0 points 0 points  What time? 0 points 0 points 0 points  Count back from 20 0 points 0 points 0 points  Months in reverse 0 points 0 points 0 points  Repeat phrase 0 points 0 points 0 points  Total Score 0 points 0 points 0 points    Immunizations Immunization History  Administered Date(s) Administered   Tdap 12/20/2012   Zoster Recombinant(Shingrix ) 02/10/2020, 04/09/2020    Screening Tests Health Maintenance  Topic Date Due   COVID-19 Vaccine (1) Never done   Pneumococcal Vaccine: 50+ Years (1 of 1 - PCV) Never done   DTaP/Tdap/Td (2 - Td or Tdap) 12/21/2022   Colonoscopy  07/05/2024 (Originally 08/23/2022)   INFLUENZA VACCINE  08/10/2023   MAMMOGRAM  08/21/2023   Medicare Annual Wellness (AWV)  07/05/2024   DEXA SCAN  Completed   Hepatitis C Screening  Completed   Zoster Vaccines- Shingrix   Completed   Hepatitis B Vaccines  Aged Out   HPV VACCINES  Aged Out   Meningococcal B Vaccine  Aged Out    Health Maintenance  Health Maintenance Due  Topic Date Due   COVID-19 Vaccine (1) Never done   Pneumococcal Vaccine: 50+ Years (1 of 1 - PCV) Never done   DTaP/Tdap/Td (2 - Td or Tdap) 12/21/2022   Health Maintenance Items Addressed:   Additional Screening:  Vision Screening: Recommended annual ophthalmology exams for early detection of glaucoma and other disorders of the eye. Would you like a referral to an eye doctor? No    Dental Screening: Recommended annual dental exams for proper oral hygiene  Community Resource Referral / Chronic Care Management: CRR required this visit?  No   CCM required this visit?  No   Plan:    I have personally reviewed and noted the following in the patient's chart:   Medical and social history Use of alcohol , tobacco or illicit drugs  Current medications and supplements including opioid prescriptions. Patient  is not currently taking opioid prescriptions. Functional ability and status Nutritional status Physical activity Advanced directives List of other physicians Hospitalizations, surgeries, and ER visits in previous 12 months Vitals Screenings to include cognitive, depression, and falls Referrals and appointments  In addition, I have reviewed and discussed with patient certain preventive protocols, quality metrics, and best practice recommendations. A written personalized care plan for preventive services as well as general preventive health recommendations were provided to patient.   Rojelio LELON Blush, LPN   [id number]/72/[id number]   After Visit Summary: (MyChart) Due to this being a telephonic visit, the after visit summary with patients personalized plan was offered to patient via MyChart   Notes: Nothing significant to report at this time.

## 2023-07-06 NOTE — Patient Instructions (Addendum)
 Ms. Guerrieri , Thank you for taking time out of your busy schedule to complete your Annual Wellness Visit with me. I enjoyed our conversation and look forward to speaking with you again next year. I, as well as your care team,  appreciate your ongoing commitment to your health goals. Please review the following plan we discussed and let me know if I can assist you in the future. Your Game plan/ To Do List    Referrals: If you haven't heard from the office you've been referred to, please reach out to them at the phone provided.   Follow up Visits: Next Medicare AWV with our clinical staff: 07/14/24 @ 9:20a   Have you seen your provider in the last 6 months (3 months if uncontrolled diabetes)? 02/20/23 Next Office Visit with your provider:    Clinician Recommendations:  Aim for 30 minutes of exercise or brisk walking, 6-8 glasses of water, and 5 servings of fruits and vegetables each day.       This is a list of the screening recommended for you and due dates:  Health Maintenance  Topic Date Due   COVID-19 Vaccine (1) Never done   Pneumococcal Vaccine for age over 40 (1 of 1 - PCV) Never done   DTaP/Tdap/Td vaccine (2 - Td or Tdap) 12/21/2022   Colon Cancer Screening  07/05/2024*   Flu Shot  08/10/2023   Mammogram  08/21/2023   Medicare Annual Wellness Visit  07/05/2024   DEXA scan (bone density measurement)  Completed   Hepatitis C Screening  Completed   Zoster (Shingles) Vaccine  Completed   Hepatitis B Vaccine  Aged Out   HPV Vaccine  Aged Out   Meningitis B Vaccine  Aged Out  *Topic was postponed. The date shown is not the original due date.    Advanced directives: (Copy Requested) Please bring a copy of your health care power of attorney and living will to the office to be added to your chart at your convenience. You can mail to [facility] [address] [place], [place] [address] or email to [email or web address] Advance Care Planning is important because  it:  [x]  Makes sure you receive the medical care that is consistent with your values, goals, and preferences  [x]  It provides guidance to your family and loved ones and reduces their decisional burden about whether or not they are making the right decisions based on your wishes.  Follow the link provided in your after visit summary or read over the paperwork we have mailed to you to help you started getting your Advance Directives in place. If you need assistance in completing these, please reach out to us  so that we can help you!  See attachments for Preventive Care and Fall Prevention Tips.

## 2023-07-24 ENCOUNTER — Other Ambulatory Visit: Payer: Self-pay | Admitting: Family Medicine

## 2023-07-24 DIAGNOSIS — Z1231 Encounter for screening mammogram for malignant neoplasm of breast: Secondary | ICD-10-CM

## 2023-08-02 ENCOUNTER — Other Ambulatory Visit: Payer: Self-pay | Admitting: Family Medicine

## 2023-08-02 DIAGNOSIS — I10 Essential (primary) hypertension: Secondary | ICD-10-CM

## 2023-08-22 ENCOUNTER — Ambulatory Visit
Admission: RE | Admit: 2023-08-22 | Discharge: 2023-08-22 | Disposition: A | Discharge status: Home / Self Care | Source: Ambulatory Visit | Attending: Family Medicine | Admitting: Family Medicine

## 2023-08-22 DIAGNOSIS — Z1231 Encounter for screening mammogram for malignant neoplasm of breast: Secondary | ICD-10-CM

## 2023-08-24 ENCOUNTER — Ambulatory Visit: Payer: Self-pay | Admitting: Family Medicine

## 2023-08-27 ENCOUNTER — Ambulatory Visit (INDEPENDENT_AMBULATORY_CARE_PROVIDER_SITE_OTHER): Admitting: Podiatry

## 2023-08-27 DIAGNOSIS — M2042 Other hammer toe(s) (acquired), left foot: Secondary | ICD-10-CM

## 2023-08-27 DIAGNOSIS — M2041 Other hammer toe(s) (acquired), right foot: Secondary | ICD-10-CM

## 2023-08-27 DIAGNOSIS — M722 Plantar fascial fibromatosis: Secondary | ICD-10-CM | POA: Diagnosis not present

## 2023-08-27 MED ORDER — TRIAMCINOLONE ACETONIDE 10 MG/ML IJ SUSP: 5.0000 mg | Freq: Once | INTRAMUSCULAR | Status: AC

## 2023-08-27 NOTE — Patient Instructions (Signed)
 Look for yoga toe socks, or foot alignment socks  --  Plantar Fasciitis (Heel Spur Syndrome) with Rehab The plantar fascia is a fibrous, ligament-like, soft-tissue structure that spans the bottom of the foot. Plantar fasciitis is a condition that causes pain in the foot due to inflammation of the tissue. SYMPTOMS  Pain and tenderness on the underneath side of the foot. Pain that worsens with standing or walking. CAUSES  Plantar fasciitis is caused by irritation and injury to the plantar fascia on the underneath side of the foot. Common mechanisms of injury include: Direct trauma to bottom of the foot. Damage to a small nerve that runs under the foot where the main fascia attaches to the heel bone. Stress placed on the plantar fascia due to bone spurs. RISK INCREASES WITH:  Activities that place stress on the plantar fascia (running, jumping, pivoting, or cutting). Poor strength and flexibility. Improperly fitted shoes. Tight calf muscles. Flat feet. Failure to warm-up properly before activity. Obesity. PREVENTION Warm up and stretch properly before activity. Allow for adequate recovery between workouts. Maintain physical fitness: Strength, flexibility, and endurance. Cardiovascular fitness. Maintain a health body weight. Avoid stress on the plantar fascia. Wear properly fitted shoes, including arch supports for individuals who have flat feet.  PROGNOSIS  If treated properly, then the symptoms of plantar fasciitis usually resolve without surgery. However, occasionally surgery is necessary.  RELATED COMPLICATIONS  Recurrent symptoms that may result in a chronic condition. Problems of the lower back that are caused by compensating for the injury, such as limping. Pain or weakness of the foot during push-off following surgery. Chronic inflammation, scarring, and partial or complete fascia tear, occurring more often from repeated injections.  TREATMENT  Treatment initially  involves the use of ice and medication to help reduce pain and inflammation. The use of strengthening and stretching exercises may help reduce pain with activity, especially stretches of the Achilles tendon. These exercises may be performed at home or with a therapist. Your caregiver may recommend that you use heel cups of arch supports to help reduce stress on the plantar fascia. Occasionally, corticosteroid injections are given to reduce inflammation. If symptoms persist for greater than 6 months despite non-surgical (conservative), then surgery may be recommended.   MEDICATION  If pain medication is necessary, then nonsteroidal anti-inflammatory medications, such as aspirin and ibuprofen, or other minor pain relievers, such as acetaminophen , are often recommended. Do not take pain medication within 7 days before surgery. Prescription pain relievers may be given if deemed necessary by your caregiver. Use only as directed and only as much as you need. Corticosteroid injections may be given by your caregiver. These injections should be reserved for the most serious cases, because they may only be given a certain number of times.  HEAT AND COLD Cold treatment (icing) relieves pain and reduces inflammation. Cold treatment should be applied for 10 to 15 minutes every 2 to 3 hours for inflammation and pain and immediately after any activity that aggravates your symptoms. Use ice packs or massage the area with a piece of ice (ice massage). Heat treatment may be used prior to performing the stretching and strengthening activities prescribed by your caregiver, physical therapist, or athletic trainer. Use a heat pack or soak the injury in warm water.  SEEK IMMEDIATE MEDICAL CARE IF: Treatment seems to offer no benefit, or the condition worsens. Any medications produce adverse side effects.  EXERCISES- RANGE OF MOTION (ROM) AND STRETCHING EXERCISES - Plantar Fasciitis (Heel Spur Syndrome) These  exercises  may help you when beginning to rehabilitate your injury. Your symptoms may resolve with or without further involvement from your physician, physical therapist or athletic trainer. While completing these exercises, remember:  Restoring tissue flexibility helps normal motion to return to the joints. This allows healthier, less painful movement and activity. An effective stretch should be held for at least 30 seconds. A stretch should never be painful. You should only feel a gentle lengthening or release in the stretched tissue.  RANGE OF MOTION - Toe Extension, Flexion Sit with your right / left leg crossed over your opposite knee. Grasp your toes and gently pull them back toward the top of your foot. You should feel a stretch on the bottom of your toes and/or foot. Hold this stretch for 10 seconds. Now, gently pull your toes toward the bottom of your foot. You should feel a stretch on the top of your toes and or foot. Hold this stretch for 10 seconds. Repeat  times. Complete this stretch 3 times per day.   RANGE OF MOTION - Ankle Dorsiflexion, Active Assisted Remove shoes and sit on a chair that is preferably not on a carpeted surface. Place right / left foot under knee. Extend your opposite leg for support. Keeping your heel down, slide your right / left foot back toward the chair until you feel a stretch at your ankle or calf. If you do not feel a stretch, slide your bottom forward to the edge of the chair, while still keeping your heel down. Hold this stretch for 10 seconds. Repeat 3 times. Complete this stretch 2 times per day.   STRETCH  Gastroc, Standing Place hands on wall. Extend right / left leg, keeping the front knee somewhat bent. Slightly point your toes inward on your back foot. Keeping your right / left heel on the floor and your knee straight, shift your weight toward the wall, not allowing your back to arch. You should feel a gentle stretch in the right / left calf. Hold this  position for 10 seconds. Repeat 3 times. Complete this stretch 2 times per day.  STRETCH  Soleus, Standing Place hands on wall. Extend right / left leg, keeping the other knee somewhat bent. Slightly point your toes inward on your back foot. Keep your right / left heel on the floor, bend your back knee, and slightly shift your weight over the back leg so that you feel a gentle stretch deep in your back calf. Hold this position for 10 seconds. Repeat 3 times. Complete this stretch 2 times per day.  STRETCH  Gastrocsoleus, Standing  Note: This exercise can place a lot of stress on your foot and ankle. Please complete this exercise only if specifically instructed by your caregiver.  Place the ball of your right / left foot on a step, keeping your other foot firmly on the same step. Hold on to the wall or a rail for balance. Slowly lift your other foot, allowing your body weight to press your heel down over the edge of the step. You should feel a stretch in your right / left calf. Hold this position for 10 seconds. Repeat this exercise with a slight bend in your right / left knee. Repeat 3 times. Complete this stretch 2 times per day.   STRENGTHENING EXERCISES - Plantar Fasciitis (Heel Spur Syndrome)  These exercises may help you when beginning to rehabilitate your injury. They may resolve your symptoms with or without further involvement from your physician, physical  therapist or athletic trainer. While completing these exercises, remember:  Muscles can gain both the endurance and the strength needed for everyday activities through controlled exercises. Complete these exercises as instructed by your physician, physical therapist or athletic trainer. Progress the resistance and repetitions only as guided.  STRENGTH - Towel Curls Sit in a chair positioned on a non-carpeted surface. Place your foot on a towel, keeping your heel on the floor. Pull the towel toward your heel by only curling your  toes. Keep your heel on the floor. Repeat 3 times. Complete this exercise 2 times per day.  STRENGTH - Ankle Inversion Secure one end of a rubber exercise band/tubing to a fixed object (table, pole). Loop the other end around your foot just before your toes. Place your fists between your knees. This will focus your strengthening at your ankle. Slowly, pull your big toe up and in, making sure the band/tubing is positioned to resist the entire motion. Hold this position for 10 seconds. Have your muscles resist the band/tubing as it slowly pulls your foot back to the starting position. Repeat 3 times. Complete this exercises 2 times per day.  Document Released: 12/26/2004 Document Revised: 03/20/2011 Document Reviewed: 04/09/2008 [employer] Patient Information 2014 [place], [place].  -- While at your visit today you received a steroid injection in your foot or ankle to help with your pain. Along with having the steroid medication there is some numbing medication in the shot that you received. Due to this you may notice some numbness to the area for the next couple of hours.   I would recommend limiting activity for the next few days to help the steroid injection take affect.    The actually benefit from the steroid injection may take up to 2-7 days to see a difference. You may actually experience a small (as in 10%) INCREASE in pain in the first 24 hours---that is common. It would be best if you can ice the area today and take anti-inflammatory medications (such as Ibuprofen, Motrin, or Aleve ) if you are able to take these medications. If you were prescribed another medication to help with the pain go ahead and start that medication today    Things to watch out for that you should contact us  or a health care provider urgently would include: 1. Unusual (as in more than 10%) increase in pain 2. New fever > 101.5 3. New swelling or redness of the injected area.  4. Streaking of red lines around the  area injected.  If you have any questions or concerns about this, please give our office a call at [phone].

## 2023-08-27 NOTE — Progress Notes (Signed)
 Subjective: Chief Complaint  Patient presents with   Plantar Fasciitis    Right foot plantar fasciitis pt stated that she is having some discomfort with her foot again she would like to have an injection again.      73 year old female presents the office for above concerns.  She states the heel pain did go away and she is not sure when it started again.  She does not recall any swelling or any injuries.  She is asking for another injection today as it was helpful previously.   She also has secondary concerns of her toes.  She does have history of surgery in her toes many years ago but she is having ongoing discomfort of the toes and she is asking this in the what can be done to help.  Inserts mostly with walking.  No injuries.  No swelling.   Objective: AAO x3, NAD DP/PT pulses palpable bilaterally, CRT less than 3 seconds There is recurrent tenderness palpation on the plantar medial tubercle of the calcaneus at the insertion of the plantar fascia on the right foot.  There is no pain with lateral compression of the calcaneus.  No pain along Achilles tendon.  Clinically the Achilles tendon, plantar fascia are intact.  There is no sign.  MMT 5/5. Upon weightbearing evaluation the second toe does sit medially deviated abutting the hallux and digital contracture noted at the 3rd and 4th toe as well.  There is no significant pain on exam.  There is no edema, erythema. No pain with calf compression, swelling, warmth, erythema  Assessment: Plantar fasciitis; digital contractures  Plan: Plantar fasciitis pain reoccurrence -Steroid injection performed.  See procedure note below.  Continue stretching, icing on a regular basis.  Continue with supportive shoe gear, inserts with good arch support.    Procedure: Injection Tendon/Ligament Discussed alternatives, risks, complications and verbal consent was obtained.  Location: Right plantar fascia at the glabrous junction; medial approach. Skin Prep:  Alcohol . Injectate: 0.5cc 0.5% marcaine plain, 0.5 cc 2% lidocaine plain and, 1 cc kenalog  10. Disposition: Patient tolerated procedure well. Injection site dressed with a band-aid.  Post-injection care was discussed and return precautions discussed.   Toe pain -I do think coming from the digital contractures.  She is not wanting to proceed with surgery at this time.  Discussed yoga toe socks, toe alignment socks to help as previous offloading pads have not been beneficial.  Dawn Reyes DPM

## 2023-08-28 DIAGNOSIS — M5416 Radiculopathy, lumbar region: Secondary | ICD-10-CM | POA: Diagnosis not present

## 2023-09-18 DIAGNOSIS — M5416 Radiculopathy, lumbar region: Secondary | ICD-10-CM | POA: Diagnosis not present

## 2023-09-20 ENCOUNTER — Other Ambulatory Visit: Payer: Self-pay | Admitting: Family Medicine

## 2023-09-20 DIAGNOSIS — E785 Hyperlipidemia, unspecified: Secondary | ICD-10-CM

## 2023-10-14 ENCOUNTER — Other Ambulatory Visit: Payer: Self-pay | Admitting: Family Medicine

## 2023-10-14 DIAGNOSIS — I10 Essential (primary) hypertension: Secondary | ICD-10-CM

## 2023-12-17 NOTE — Progress Notes (Deleted)
 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    UPDATE: Burning mouth syndrome and right-sided trigeminal neuralgia: Current medication:  gabapentin  900mg /300mg /900mg  Stable at this time.  Restless leg syndrome: Current medication:  gabapentin  900mg /300mg /900mg  She is doing well.   HISTORY: Burning mouth syndrome and right-sided trigeminal neuralgia: Onset of symptoms occurred in 2013. The symptoms correlate soon after the dental procedure involving 2 canals of 2 of her right-sided upper teeth. She did see the dentist for this, who said that her symptoms are unlikely related.  She notes a discomfort on the left side of her face, involving the maxilla to the temple. It is not really a pain. However, she will occasionally have a brief shooting sensation, about a 5/10.  It is sensitive to the touch.  It appears to be sensitive when she is putting on her makeup. She is never in extreme pain however. She also has a burning sensation of her tongue. This also is a 5/10 in intensity. It is less painful when she is eating or drinking.  Lidocaine mouth rinse and ice provide brief relief  Restless leg syndrome: She has had it for several years.  It occurs in the evening and in bed.  When she is in bed or sitting still watching TV, she would experience discomfort in the legs that is relieved by movement.   PAST MEDICAL HISTORY: Past Medical History:  Diagnosis Date   Anxiety    Benign hypertension    Burning tongue syndrome 02/06/2013   Candidiasis  of mouth    Depression and Anxiety - followed by Dr. 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 07/16/2012   Fibromyalgia    GERD (gastroesophageal reflux disease)    Hematochezia 02/13/2012   Left sided abdominal pain 02/13/2012   Osteoporosis, unspecified 07/30/2012    MEDICATIONS: Current Outpatient Medications on File Prior to Visit  Medication Sig Dispense Refill   CALCIUM  PO Take 600 mg by mouth daily.     celecoxib  (CELEBREX ) 100 MG capsule Take 1 capsule (100 mg total) by mouth 2 (two) times daily. 60 capsule 2   cetirizine  (ZYRTEC  ALLERGY) 10 MG tablet Take 1 tablet (10 mg total) by mouth daily. 30 tablet 0   Cholecalciferol (VITAMIN D3) 2000 UNITS TABS Take 1 tablet by mouth daily.     gabapentin  (NEURONTIN ) 300 MG capsule Take 3 capsules in morning, 1 capsule in afternoon and 3 capsules at bedtime 630 capsule 0   hydrochlorothiazide  (HYDRODIURIL ) 12.5 MG tablet TAKE 1 TABLET EVERY DAY 90 tablet 1   Multiple Vitamin (MULTIVITAMIN) tablet Take 1 tablet by mouth daily.     rosuvastatin  (CRESTOR ) 5 MG tablet TAKE 1 TABLET EVERY DAY 90 tablet 1   Specialty Vitamins Products (BIOTIN PLUS KERATIN) 10000-100 MCG-MG TABS Take by mouth.     zoledronic  acid (RECLAST ) 5 MG/100ML SOLN injection Inject 100 mLs (5 mg total) into the vein once. 100 mL 0   Current Facility-Administered Medications on File Prior to Visit  Medication Dose Route Frequency Provider Last Rate Last Admin   triamcinolone  acetonide (KENALOG ) 10 MG/ML injection  5 mg  5 mg Intramuscular Once         ALLERGIES: Allergies  Allergen Reactions   Vicodin [Hydrocodone-Acetaminophen ] Itching    FAMILY HISTORY: Family History  Problem Relation Age of Onset   Lung cancer Mother    Arthritis Mother    Hypertension Father    Lung cancer Father    Liver cancer Father    Epilepsy Father    Hypertension Sister    Osteoporosis Sister    Hypertension Brother    Arthritis Brother    Lung cancer Brother 75       small cell   Colon cancer Son 31    Diabetes Neg Hx       Objective:  *** General: No acute distress.  Patient appears well-groomed.   Head:  Normocephalic/atraumatic Neck:  Supple.  No paraspinal tenderness.  Full range of motion. Heart:  Regular rate and rhythm. Neuro:  Alert and oriented.  Speech fluent and not dysarthric.  Language intact.  CN II-XII intact.  Bulk and tone normal.  Muscle strength 5/5 throughout.  Sensation to light touch intact.  Deep tendon reflexes 2+ throughout, toes downgoing.  Gait normal.  Romberg negative.    Juliene Dunnings, DO  CC: Heron Sharper, MD

## 2023-12-18 ENCOUNTER — Telehealth: Payer: Medicare Other | Admitting: Neurology

## 2023-12-20 NOTE — Progress Notes (Unsigned)
 Virtual Visit via Video Note:   Consent was obtained for video visit:  Yes.   Answered questions that patient had about telehealth interaction:  Yes.   I discussed the limitations, risks, security and privacy concerns of performing an evaluation and management service by telemedicine. I also discussed with the patient that there may be a patient responsible charge related to this service. The patient expressed understanding and agreed to proceed.  Pt location: Home Physician Location: office Name of referring provider:  Ozell Heron HERO, MD I connected with Dawn Reyes at patients initiation/request on 12/21/2023 at  2:50 PM EST by video enabled telemedicine application and verified that I am speaking with the correct person using two identifiers. 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    UPDATE: Burning mouth syndrome and right-sided trigeminal neuralgia: Current medication:  gabapentin  900mg /300mg /900mg  Stable at this time.  Restless leg syndrome: Current medication:  gabapentin  900mg /300mg /900mg  She is doing well.   HISTORY: Burning mouth syndrome and right-sided trigeminal neuralgia: Onset of symptoms occurred in 2013. The symptoms correlate soon after the dental procedure involving 2 canals of 2 of her right-sided upper teeth. She did see the dentist for this, who said that her symptoms are unlikely related.  She notes a discomfort on the left side of her face, involving the maxilla to the temple. It is not  really a pain. However, she will occasionally have a brief shooting sensation, about a 5/10.  It is sensitive to the touch.  It appears to be sensitive when she is putting on her makeup. She is never in extreme pain however. She also has a burning sensation of her tongue. This also is a 5/10 in intensity. It is less painful when she is eating or drinking.  Lidocaine mouth rinse and ice provide brief relief  Restless leg syndrome: She has had it for several years.  It occurs in the evening and in bed.  When she is in bed or sitting still watching TV, she would experience discomfort in the legs that is relieved by movement.  Past Medical History: Past Medical History:  Diagnosis Date   Anxiety    Benign hypertension    Burning tongue syndrome 02/06/2013   Candidiasis of mouth    Depression and Anxiety - followed by Dr. 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 07/16/2012   Fibromyalgia    GERD (gastroesophageal reflux disease)    Hematochezia 02/13/2012   Left sided abdominal pain 02/13/2012   Osteoporosis, unspecified 07/30/2012    Medications: Outpatient Encounter Medications as of 12/21/2023  Medication Sig   CALCIUM  PO Take 600 mg by mouth daily.   celecoxib  (CELEBREX ) 100 MG capsule Take 1 capsule (100 mg total) by mouth 2 (two) times daily.   cetirizine  (ZYRTEC  ALLERGY) 10 MG tablet Take 1 tablet (10 mg total) by mouth daily.   Cholecalciferol (VITAMIN D3) 2000 UNITS TABS Take 1 tablet by mouth daily.   gabapentin  (NEURONTIN ) 300 MG capsule Take 3 capsules in morning, 1 capsule in afternoon and 3 capsules at bedtime  hydrochlorothiazide  (HYDRODIURIL ) 12.5 MG tablet TAKE 1 TABLET EVERY DAY   Multiple Vitamin (MULTIVITAMIN) tablet Take 1 tablet by mouth daily.   rosuvastatin  (CRESTOR ) 5 MG tablet TAKE 1 TABLET EVERY DAY   Specialty Vitamins Products (BIOTIN PLUS KERATIN) 10000-100 MCG-MG TABS Take by mouth.   zoledronic  acid (RECLAST ) 5 MG/100ML SOLN injection Inject 100 mLs (5 mg total) into the vein once.    Facility-Administered Encounter Medications as of 12/21/2023  Medication   triamcinolone  acetonide (KENALOG ) 10 MG/ML injection 5 mg    Allergies: Allergies[1]  Family History: Family History  Problem Relation Age of Onset   Lung cancer Mother    Arthritis Mother    Hypertension Father    Lung cancer Father    Liver cancer Father    Epilepsy Father    Hypertension Sister    Osteoporosis Sister    Hypertension Brother    Arthritis Brother    Lung cancer Brother 75       small cell   Colon cancer Son 82   Diabetes Neg Hx     Observations/Objective:   No acute distress.  Alert and oriented.  Speech fluent and not dysarthric.  Language intact.  Eyes orthophoric on primary gaze.  Face symmetric.   Follow up Instructions:      -I discussed the assessment and treatment plan with the patient. The patient was provided an opportunity to ask questions and all were answered. The patient agreed with the plan and demonstrated an understanding of the instructions.   The patient was advised to call back or seek an in-person evaluation if the symptoms worsen or if the condition fails to improve as anticipated.   Juliene Lamar Dunnings, DO  CC: Heron Sharper, MD         [1]  Allergies Allergen Reactions   Vicodin [Hydrocodone-Acetaminophen ] Itching

## 2023-12-21 ENCOUNTER — Encounter: Payer: Self-pay | Admitting: Neurology

## 2023-12-21 ENCOUNTER — Telehealth: Admitting: Neurology

## 2023-12-21 DIAGNOSIS — G2581 Restless legs syndrome: Secondary | ICD-10-CM

## 2023-12-21 DIAGNOSIS — G500 Trigeminal neuralgia: Secondary | ICD-10-CM

## 2023-12-21 DIAGNOSIS — K146 Glossodynia: Secondary | ICD-10-CM

## 2023-12-21 MED ORDER — GABAPENTIN 300 MG PO CAPS: ORAL_CAPSULE | ORAL | 3 refills | Status: AC

## 2024-01-07 ENCOUNTER — Other Ambulatory Visit: Payer: Self-pay | Admitting: Family Medicine

## 2024-01-07 DIAGNOSIS — M170 Bilateral primary osteoarthritis of knee: Secondary | ICD-10-CM

## 2024-05-07 ENCOUNTER — Ambulatory Visit: Admitting: Internal Medicine

## 2024-07-14 ENCOUNTER — Ambulatory Visit
